# Patient Record
Sex: Male | Born: 1965 | Race: Black or African American | Hispanic: No | Smoking: Never smoker
Health system: Southern US, Community
[De-identification: ages and names within clinical notes are randomized; demographics above are authoritative.]

## PROBLEM LIST (undated history)

## (undated) DIAGNOSIS — G4733 Obstructive sleep apnea (adult) (pediatric): Secondary | ICD-10-CM

## (undated) DIAGNOSIS — Z8639 Personal history of other endocrine, nutritional and metabolic disease: Secondary | ICD-10-CM

## (undated) DIAGNOSIS — E118 Type 2 diabetes mellitus with unspecified complications: Secondary | ICD-10-CM

## (undated) DIAGNOSIS — I428 Other cardiomyopathies: Secondary | ICD-10-CM

## (undated) DIAGNOSIS — I5042 Chronic combined systolic (congestive) and diastolic (congestive) heart failure: Secondary | ICD-10-CM

## (undated) DIAGNOSIS — N183 Chronic kidney disease, stage 3 unspecified: Secondary | ICD-10-CM

## (undated) DIAGNOSIS — I639 Cerebral infarction, unspecified: Secondary | ICD-10-CM

## (undated) DIAGNOSIS — Z9119 Patient's noncompliance with other medical treatment and regimen: Secondary | ICD-10-CM

## (undated) DIAGNOSIS — Z6841 Body Mass Index (BMI) 40.0 and over, adult: Secondary | ICD-10-CM

## (undated) DIAGNOSIS — I10 Essential (primary) hypertension: Secondary | ICD-10-CM

## (undated) HISTORY — DX: Personal history of other endocrine, nutritional and metabolic disease: Z86.39

## (undated) HISTORY — PX: ANKLE SURGERY: SHX546

## (undated) HISTORY — PX: HERNIA REPAIR: SHX51

## (undated) HISTORY — DX: Chronic combined systolic (congestive) and diastolic (congestive) heart failure: I50.42

## (undated) HISTORY — DX: Type 2 diabetes mellitus with unspecified complications: E11.8

## (undated) HISTORY — PX: TRANSTHORACIC ECHOCARDIOGRAM: SHX275

## (undated) HISTORY — DX: Cerebral infarction, unspecified: I63.9

---

## 2000-03-21 ENCOUNTER — Encounter: Payer: Self-pay | Admitting: Emergency Medicine

## 2000-03-21 ENCOUNTER — Emergency Department (HOSPITAL_COMMUNITY): Admission: EM | Admit: 2000-03-21 | Discharge: 2000-03-21 | Payer: Self-pay

## 2000-03-30 ENCOUNTER — Emergency Department (HOSPITAL_COMMUNITY): Admission: EM | Admit: 2000-03-30 | Discharge: 2000-03-30 | Payer: Self-pay | Admitting: Emergency Medicine

## 2007-01-04 ENCOUNTER — Emergency Department (HOSPITAL_COMMUNITY): Admission: EM | Admit: 2007-01-04 | Discharge: 2007-01-04 | Payer: Self-pay | Admitting: Emergency Medicine

## 2009-01-19 ENCOUNTER — Ambulatory Visit: Payer: Self-pay | Admitting: Diagnostic Radiology

## 2009-01-19 ENCOUNTER — Inpatient Hospital Stay (HOSPITAL_COMMUNITY): Admission: EM | Admit: 2009-01-19 | Discharge: 2009-01-27 | Payer: Self-pay | Admitting: Internal Medicine

## 2009-01-19 ENCOUNTER — Encounter: Payer: Self-pay | Admitting: Emergency Medicine

## 2009-01-20 ENCOUNTER — Encounter (INDEPENDENT_AMBULATORY_CARE_PROVIDER_SITE_OTHER): Payer: Self-pay | Admitting: Internal Medicine

## 2009-02-16 ENCOUNTER — Ambulatory Visit: Payer: Self-pay | Admitting: Pulmonary Disease

## 2009-03-10 ENCOUNTER — Ambulatory Visit (HOSPITAL_BASED_OUTPATIENT_CLINIC_OR_DEPARTMENT_OTHER): Admission: RE | Admit: 2009-03-10 | Discharge: 2009-03-10 | Payer: Self-pay | Admitting: Pediatrics

## 2009-03-10 ENCOUNTER — Encounter: Payer: Self-pay | Admitting: Pulmonary Disease

## 2009-03-17 ENCOUNTER — Telehealth (INDEPENDENT_AMBULATORY_CARE_PROVIDER_SITE_OTHER): Payer: Self-pay | Admitting: *Deleted

## 2009-03-17 ENCOUNTER — Ambulatory Visit: Payer: Self-pay | Admitting: Pulmonary Disease

## 2009-03-20 ENCOUNTER — Telehealth: Payer: Self-pay | Admitting: Pulmonary Disease

## 2009-03-23 ENCOUNTER — Telehealth (INDEPENDENT_AMBULATORY_CARE_PROVIDER_SITE_OTHER): Payer: Self-pay | Admitting: *Deleted

## 2009-04-08 ENCOUNTER — Ambulatory Visit: Payer: Self-pay | Admitting: Pulmonary Disease

## 2009-05-13 ENCOUNTER — Ambulatory Visit: Payer: Self-pay | Admitting: Pulmonary Disease

## 2010-05-25 NOTE — Letter (Signed)
Summary: [employer]  [address]   [place], [place] [address]   Phone: [phone]  Fax: [phone]    Today's Date: May 13, 2009  Name of Patient: Devon Ortiz  The above named patient had a medical visit today at:  am / pm.  Please take this into consideration when reviewing the time away from work/school.    Special Instructions:  [  ] None  [ x] To be off the remainder of today, returning to the normal work / school schedule tomorrow.  [  ] To be off until the next scheduled appointment on ______________________.  [  ] Other ________________________________________________________________ ________________________________________________________________________   Sincerely yours,   Comer Locket. Vassie Loll MD

## 2010-05-25 NOTE — Assessment & Plan Note (Signed)
Summary: rov 1 months///kp   Visit Type:  Follow-up Copy to:  Dr. Katrinka Blazing [facility] cardiology) Primary Provider/Referring Provider:  [facility], [address], [place]  CC:  Pt here for follow up. Pt states is using CPAP every night approx 5 to 8 hours. Pt states wakes up in the AM feeling well rested.  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 He was admitted 9/27- 10/5 for Acute on chronic systolic heart failure secondary to hypertension  most likely potentially aggravated by obstructive sleep apnea with  a reduced EF of 35-40%, RVSP 43 and left   ventricular hypertrophy. Poorly controlled hypertension secondary to medical noncompliance secondary to financial hardship.  Elevated renal function  was felt to be due to secondary to hypertension/diuretics. Witnessed apneas were noted during the adm. Fiance provides sleep history - loud snoring worse on his back with apneas lasting about 30 seconds or so, gasping episodes.  Sleep latency 15- 20 mins, nocturia due to lasix ? wakes up groggy, no somnolence as long as he is moving around. Epworth Sleepiness Score 18.  April 08, 2009 9:06 AM  11/23 >> reviewed PSG with pt, severe obstructive sleep apnea with AHI 76/h corrected by full face CPAP @ 23 cm  start autoCPAP 12-23 cm , large full face mask, heated humidity Much refreshed, everyone amazed at work, no naps BP high this am, did not take meds this am. Steffanie Rainwater has custody of grandkids now  May 13, 2009 4:24 PM  Pt states is using CPAP every night approx 5 to 8 hours. Pt states wakes up in the AM feeling well rested ramp x , mask ok, pressure ok, some dryness, humidifier helps. Lost 5 lbs dowload 12/1 -12/15  improved compliance, residual AHI 2 /h, no leak     Current Medications (verified): 1)  Norvasc 5 Mg Tabs (Amlodipine Besylate) .... Take 1 Tablet By Mouth Once A Day 2)  Lasix 80 Mg Tabs (Furosemide) ....  Take 1 Tablet By Mouth Once A Day 3)  Lisinopril 20 Mg Tabs (Lisinopril) .... Take 1 Tablet By Mouth Two Times A Day 4)  Lopressor 100 Mg Tabs (Metoprolol Tartrate) .... Take 1 Tablet By Mouth Two Times A Day 5)  Klor-Con M20 20 Meq Cr-Tabs (Potassium Chloride Crys Cr) .... Take 1 Tablet By Mouth Once A Day 6)  Aspirin 325 Mg  Tabs (Aspirin) .... Take 1 Tablet By Mouth Once A Day 7)  Cpap 12-20 .... 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   Review of Systems  The patient denies anorexia, fever, weight loss, weight gain, vision loss, decreased hearing, hoarseness, chest pain, syncope, dyspnea on exertion, peripheral edema, prolonged cough, headaches, hemoptysis, abdominal pain, melena, hematochezia, severe indigestion/heartburn, hematuria, muscle weakness, suspicious skin lesions, difficulty walking, depression, unusual weight change, and abnormal bleeding.    Vital Signs:  Patient profile:   45 year old male Height:      68 inches Weight:      286 pounds O2 Sat:      96 % on Room air Temp:     98.2 degrees F oral  Pulse rate:   86 / minute BP sitting:   160 / 100  (left arm) Cuff size:   large  Vitals Entered By: Zackery Barefoot CMA (May 13, 2009 4:15 PM)  O2 Flow:  Room air CC: Pt here for follow up. Pt states is using CPAP every night approx 5 to 8 hours. Pt states wakes up in the AM feeling well rested Comments Medications reviewed with patient Verified pt's contact number Zackery Barefoot CMA  May 13, 2009 4:15 PM    Physical Exam  Additional Exam:  Gen.  Pleasant, well-nourished, in no distress, normal affect wt 290 04/08/09, 286 May 13, 2009  ENT - no lesions, no post nasal drip, class 3 airway Neck: No JVD, no thyromegaly, no carotid bruits Lungs: no use of accessory muscles, no dullness to percussion, clear without rales or rhonchi  Cardiovascular: Rhythm regular, heart sounds  normal, no murmurs or gallops, no peripheral edema Musculoskeletal: No deformities, no cyanosis or clubbing      Impression & Recommendations:  Problem # 1:  HYPERSOMNIA, ASSOCIATED WITH SLEEP APNEA (ICD-780.53) Compliance encouraged, wt loss emphasized, asked to avoid meds with sedative side effects, cautioned against driving when sleepy.  ct autoCPAP 12-20 ,this is working well for him.  Orders: Est. Patient Level III (09811) DME Referral (DME)  Problem # 2:  HYPERTENSION (ICD-401.9) Hope to see benefits on BP , meds being titrated. His updated medication list for this problem includes:    Norvasc 5 Mg Tabs (Amlodipine besylate) .Marland Kitchen... Take 1 tablet by mouth once a day    Lasix 80 Mg Tabs (Furosemide) .Marland Kitchen... Take 1 tablet by mouth once a day    Lisinopril 20 Mg Tabs (Lisinopril) .Marland Kitchen... Take 1 tablet by mouth two times a day    Lopressor 100 Mg Tabs (Metoprolol tartrate) .Marland Kitchen... Take 1 tablet by mouth two times a day  Medications Added to Medication List This Visit: 1)  Lopressor 100 Mg Tabs (Metoprolol tartrate) .... Take 1 tablet by mouth two times a day  Patient Instructions: 1)  Please schedule a follow-up appointment in 6 months. 2)  Copy sent to:Dr Claudie Revering

## 2010-07-29 LAB — BASIC METABOLIC PANEL
BUN: 15 mg/dL (ref 6–23)
CO2: 26 mEq/L (ref 19–32)
CO2: 28 mEq/L (ref 19–32)
CO2: 29 mEq/L (ref 19–32)
Calcium: 8.6 mg/dL (ref 8.4–10.5)
Calcium: 8.7 mg/dL (ref 8.4–10.5)
Calcium: 8.8 mg/dL (ref 8.4–10.5)
Calcium: 9 mg/dL (ref 8.4–10.5)
Calcium: 9.1 mg/dL (ref 8.4–10.5)
Chloride: 102 mEq/L (ref 96–112)
Chloride: 102 mEq/L (ref 96–112)
Creatinine, Ser: 1.53 mg/dL — ABNORMAL HIGH (ref 0.4–1.5)
Creatinine, Ser: 1.55 mg/dL — ABNORMAL HIGH (ref 0.4–1.5)
Creatinine, Ser: 1.55 mg/dL — ABNORMAL HIGH (ref 0.4–1.5)
Creatinine, Ser: 1.72 mg/dL — ABNORMAL HIGH (ref 0.4–1.5)
GFR calc Af Amer: 53 mL/min — ABNORMAL LOW (ref >60)
GFR calc Af Amer: 58 mL/min — ABNORMAL LOW (ref >60)
GFR calc Af Amer: >60 mL/min (ref >60)
GFR calc Af Amer: >60 mL/min (ref >60)
GFR calc Af Amer: >60 mL/min (ref >60)
GFR calc non Af Amer: 44 mL/min — ABNORMAL LOW (ref >60)
GFR calc non Af Amer: 48 mL/min — ABNORMAL LOW (ref >60)
GFR calc non Af Amer: 50 mL/min — ABNORMAL LOW (ref >60)
Glucose, Bld: 101 mg/dL — ABNORMAL HIGH (ref 70–99)
Glucose, Bld: 94 mg/dL (ref 70–99)
Potassium: 3.4 mEq/L — ABNORMAL LOW (ref 3.5–5.1)
Potassium: 3.7 mEq/L (ref 3.5–5.1)
Sodium: 134 mEq/L — ABNORMAL LOW (ref 135–145)
Sodium: 137 mEq/L (ref 135–145)
Sodium: 138 mEq/L (ref 135–145)
Sodium: 138 mEq/L (ref 135–145)
Sodium: 138 mEq/L (ref 135–145)

## 2010-07-29 LAB — MAGNESIUM: Magnesium: 2.3 mg/dL (ref 1.5–2.5)

## 2010-07-29 LAB — BRAIN NATRIURETIC PEPTIDE
Pro B Natriuretic peptide (BNP): 232 pg/mL — ABNORMAL HIGH (ref 0.0–100.0)
Pro B Natriuretic peptide (BNP): 275 pg/mL — ABNORMAL HIGH (ref 0.0–100.0)
Pro B Natriuretic peptide (BNP): 278 pg/mL — ABNORMAL HIGH (ref 0.0–100.0)
Pro B Natriuretic peptide (BNP): 360 pg/mL — ABNORMAL HIGH (ref 0.0–100.0)

## 2010-07-30 LAB — CBC
MCHC: 33.2 g/dL (ref 30.0–36.0)
MCV: 85.6 fL (ref 78.0–100.0)
Platelets: 182 10*3/uL (ref 150–400)
Platelets: 187 10*3/uL (ref 150–400)
RBC: 5.02 MIL/uL (ref 4.22–5.81)
RBC: 5.09 MIL/uL (ref 4.22–5.81)
RDW: 15 % (ref 11.5–15.5)
WBC: 5.5 10*3/uL (ref 4.0–10.5)
WBC: 6.8 10*3/uL (ref 4.0–10.5)

## 2010-07-30 LAB — BASIC METABOLIC PANEL
BUN: 11 mg/dL (ref 6–23)
BUN: 15 mg/dL (ref 6–23)
Calcium: 8.3 mg/dL — ABNORMAL LOW (ref 8.4–10.5)
Calcium: 8.4 mg/dL (ref 8.4–10.5)
Creatinine, Ser: 1.51 mg/dL — ABNORMAL HIGH (ref 0.4–1.5)
Creatinine, Ser: 1.55 mg/dL — ABNORMAL HIGH (ref 0.4–1.5)
GFR calc non Af Amer: 50 mL/min — ABNORMAL LOW (ref >60)
GFR calc non Af Amer: 51 mL/min — ABNORMAL LOW (ref >60)
Glucose, Bld: 127 mg/dL — ABNORMAL HIGH (ref 70–99)
Glucose, Bld: 99 mg/dL (ref 70–99)
Sodium: 134 mEq/L — ABNORMAL LOW (ref 135–145)
Sodium: 136 mEq/L (ref 135–145)

## 2010-07-30 LAB — COMPREHENSIVE METABOLIC PANEL
ALT: 25 U/L (ref 0–53)
ALT: 29 U/L (ref 0–53)
AST: 22 U/L (ref 0–37)
AST: 22 U/L (ref 0–37)
Albumin: 3.3 g/dL — ABNORMAL LOW (ref 3.5–5.2)
CO2: 30 mEq/L (ref 19–32)
Chloride: 104 mEq/L (ref 96–112)
Chloride: 105 mEq/L (ref 96–112)
Creatinine, Ser: 1.3 mg/dL (ref 0.4–1.5)
GFR calc Af Amer: >60 mL/min (ref >60)
GFR calc Af Amer: >60 mL/min (ref >60)
GFR calc non Af Amer: 52 mL/min — ABNORMAL LOW (ref >60)
Potassium: 3.6 mEq/L (ref 3.5–5.1)
Sodium: 141 mEq/L (ref 135–145)
Sodium: 141 mEq/L (ref 135–145)
Total Bilirubin: 0.9 mg/dL (ref 0.3–1.2)
Total Bilirubin: 2.2 mg/dL — ABNORMAL HIGH (ref 0.3–1.2)

## 2010-07-30 LAB — DIFFERENTIAL
Basophils Absolute: 0 10*3/uL (ref 0.0–0.1)
Basophils Absolute: 0.2 10*3/uL — ABNORMAL HIGH (ref 0.0–0.1)
Eosinophils Absolute: 0.1 10*3/uL (ref 0.0–0.7)
Eosinophils Relative: 1 % (ref 0–5)
Lymphocytes Relative: 24 % (ref 12–46)
Lymphs Abs: 1.4 10*3/uL (ref 0.7–4.0)
Monocytes Absolute: 0.6 10*3/uL (ref 0.1–1.0)
Monocytes Absolute: 0.6 10*3/uL (ref 0.1–1.0)

## 2010-07-30 LAB — CARDIAC PANEL(CRET KIN+CKTOT+MB+TROPI)
CK, MB: 1.5 ng/mL (ref 0.3–4.0)
CK, MB: 1.6 ng/mL (ref 0.3–4.0)
Total CK: 301 U/L — ABNORMAL HIGH (ref 7–232)
Troponin I: 0.01 ng/mL (ref 0.00–0.06)

## 2010-07-30 LAB — T4, FREE: Free T4: 1.16 ng/dL (ref 0.80–1.80)

## 2010-07-30 LAB — URINALYSIS, ROUTINE W REFLEX MICROSCOPIC
Bilirubin Urine: NEGATIVE
Hgb urine dipstick: NEGATIVE
Specific Gravity, Urine: 1.021 (ref 1.005–1.030)
Urobilinogen, UA: 1 mg/dL (ref 0.0–1.0)
pH: 6 (ref 5.0–8.0)

## 2010-07-30 LAB — LIPID PANEL
Cholesterol: 154 mg/dL (ref 0–200)
LDL Cholesterol: 107 mg/dL — ABNORMAL HIGH (ref 0–99)
Total CHOL/HDL Ratio: 4.4 RATIO

## 2010-07-30 LAB — POCT CARDIAC MARKERS
CKMB, poc: <1 ng/mL — ABNORMAL LOW (ref 1.0–8.0)
Myoglobin, poc: 97.7 ng/mL (ref 12–200)

## 2010-12-25 DIAGNOSIS — I428 Other cardiomyopathies: Secondary | ICD-10-CM | POA: Diagnosis present

## 2010-12-25 HISTORY — DX: Other cardiomyopathies: I42.8

## 2011-01-17 ENCOUNTER — Emergency Department (HOSPITAL_COMMUNITY): Payer: Managed Care, Other (non HMO)

## 2011-01-17 ENCOUNTER — Inpatient Hospital Stay (HOSPITAL_COMMUNITY)
Admission: EM | Admit: 2011-01-17 | Discharge: 2011-01-22 | DRG: 292 | Disposition: A | Discharge status: Home / Self Care | Payer: Managed Care, Other (non HMO) | Attending: Internal Medicine | Admitting: Internal Medicine

## 2011-01-17 DIAGNOSIS — R7309 Other abnormal glucose: Secondary | ICD-10-CM | POA: Diagnosis present

## 2011-01-17 DIAGNOSIS — N179 Acute kidney failure, unspecified: Secondary | ICD-10-CM | POA: Diagnosis not present

## 2011-01-17 DIAGNOSIS — Z7982 Long term (current) use of aspirin: Secondary | ICD-10-CM

## 2011-01-17 DIAGNOSIS — I129 Hypertensive chronic kidney disease with stage 1 through stage 4 chronic kidney disease, or unspecified chronic kidney disease: Secondary | ICD-10-CM | POA: Diagnosis present

## 2011-01-17 DIAGNOSIS — N182 Chronic kidney disease, stage 2 (mild): Secondary | ICD-10-CM | POA: Diagnosis present

## 2011-01-17 DIAGNOSIS — I428 Other cardiomyopathies: Secondary | ICD-10-CM | POA: Diagnosis present

## 2011-01-17 DIAGNOSIS — I5043 Acute on chronic combined systolic (congestive) and diastolic (congestive) heart failure: Principal | ICD-10-CM | POA: Diagnosis present

## 2011-01-17 DIAGNOSIS — I509 Heart failure, unspecified: Secondary | ICD-10-CM | POA: Diagnosis present

## 2011-01-17 DIAGNOSIS — E669 Obesity, unspecified: Secondary | ICD-10-CM | POA: Diagnosis present

## 2011-01-17 DIAGNOSIS — G4733 Obstructive sleep apnea (adult) (pediatric): Secondary | ICD-10-CM | POA: Diagnosis present

## 2011-01-17 LAB — COMPREHENSIVE METABOLIC PANEL
Alkaline Phosphatase: 76 U/L (ref 39–117)
BUN: 19 mg/dL (ref 6–23)
Chloride: 103 mEq/L (ref 96–112)
GFR calc Af Amer: >60 mL/min (ref >60)
Glucose, Bld: 119 mg/dL — ABNORMAL HIGH (ref 70–99)
Potassium: 3.6 mEq/L (ref 3.5–5.1)
Total Bilirubin: 1 mg/dL (ref 0.3–1.2)

## 2011-01-17 LAB — CK TOTAL AND CKMB (NOT AT ARMC): Total CK: 445 U/L — ABNORMAL HIGH (ref 7–232)

## 2011-01-17 LAB — DIFFERENTIAL
Eosinophils Absolute: 0.1 10*3/uL (ref 0.0–0.7)
Lymphs Abs: 1.4 10*3/uL (ref 0.7–4.0)
Monocytes Absolute: 0.3 10*3/uL (ref 0.1–1.0)
Monocytes Relative: 6 % (ref 3–12)
Neutrophils Relative %: 68 % (ref 43–77)

## 2011-01-17 LAB — CBC
Hemoglobin: 13.8 g/dL (ref 13.0–17.0)
MCH: 26 pg (ref 26.0–34.0)
MCHC: 31.3 g/dL (ref 30.0–36.0)
MCV: 83.2 fL (ref 78.0–100.0)
Platelets: 205 10*3/uL (ref 150–400)
RBC: 5.3 MIL/uL (ref 4.22–5.81)

## 2011-01-18 LAB — LIPID PANEL
LDL Cholesterol: 137 mg/dL — ABNORMAL HIGH (ref 0–99)
Total CHOL/HDL Ratio: 4.4 RATIO
VLDL: 14 mg/dL (ref 0–40)

## 2011-01-18 LAB — BASIC METABOLIC PANEL
Calcium: 9.6 mg/dL (ref 8.4–10.5)
GFR calc non Af Amer: 51 mL/min — ABNORMAL LOW (ref >60)
Glucose, Bld: 106 mg/dL — ABNORMAL HIGH (ref 70–99)
Sodium: 138 mEq/L (ref 135–145)

## 2011-01-18 LAB — DIFFERENTIAL
Basophils Absolute: 0 10*3/uL (ref 0.0–0.1)
Eosinophils Relative: 2 % (ref 0–5)
Lymphocytes Relative: 35 % (ref 12–46)
Neutro Abs: 2.5 10*3/uL (ref 1.7–7.7)
Neutrophils Relative %: 51 % (ref 43–77)

## 2011-01-18 LAB — CK TOTAL AND CKMB (NOT AT ARMC)
CK, MB: 2.1 ng/mL (ref 0.3–4.0)
CK, MB: 2.5 ng/mL (ref 0.3–4.0)
Relative Index: 0.5 (ref 0.0–2.5)
Relative Index: 0.6 (ref 0.0–2.5)
Total CK: 376 U/L — ABNORMAL HIGH (ref 7–232)

## 2011-01-18 LAB — CBC
HCT: 42.2 % (ref 39.0–52.0)
Hemoglobin: 13.7 g/dL (ref 13.0–17.0)
RDW: 14.6 % (ref 11.5–15.5)
WBC: 4.9 10*3/uL (ref 4.0–10.5)

## 2011-01-18 LAB — TROPONIN I: Troponin I: <0.3 ng/mL (ref <0.30)

## 2011-01-18 LAB — HEMOGLOBIN A1C: Mean Plasma Glucose: 134 mg/dL — ABNORMAL HIGH (ref <117)

## 2011-01-19 ENCOUNTER — Ambulatory Visit (HOSPITAL_COMMUNITY): Payer: Managed Care, Other (non HMO)

## 2011-01-19 ENCOUNTER — Inpatient Hospital Stay (HOSPITAL_COMMUNITY): Payer: Managed Care, Other (non HMO)

## 2011-01-19 LAB — BASIC METABOLIC PANEL
Calcium: 9.4 mg/dL (ref 8.4–10.5)
Creatinine, Ser: 1.66 mg/dL — ABNORMAL HIGH (ref 0.50–1.35)
GFR calc Af Amer: 55 mL/min — ABNORMAL LOW (ref >60)
GFR calc non Af Amer: 45 mL/min — ABNORMAL LOW (ref >60)

## 2011-01-19 LAB — MAGNESIUM: Magnesium: 2.3 mg/dL (ref 1.5–2.5)

## 2011-01-19 LAB — CBC
MCH: 26.3 pg (ref 26.0–34.0)
MCHC: 32.1 g/dL (ref 30.0–36.0)
MCV: 81.9 fL (ref 78.0–100.0)
Platelets: 194 10*3/uL (ref 150–400)
RDW: 14.6 % (ref 11.5–15.5)
WBC: 6.1 10*3/uL (ref 4.0–10.5)

## 2011-01-19 MED ORDER — TECHNETIUM TC 99M TETROFOSMIN IV KIT: 30.0000 | PACK | Freq: Once | INTRAVENOUS | Status: AC | PRN

## 2011-01-19 MED ORDER — TECHNETIUM TC 99M TETROFOSMIN IV KIT
10.0000 | PACK | Freq: Once | INTRAVENOUS | Status: AC | PRN
  Administered 2011-01-19: 10 via INTRAVENOUS

## 2011-01-20 LAB — MRSA PCR SCREENING: MRSA by PCR: NEGATIVE

## 2011-01-21 ENCOUNTER — Ambulatory Visit (HOSPITAL_COMMUNITY)
Admission: AD | Admit: 2011-01-21 | Discharge: 2011-01-21 | Disposition: A | Discharge status: Home / Self Care | Payer: Managed Care, Other (non HMO) | Source: Ambulatory Visit | Attending: Cardiology | Admitting: Cardiology

## 2011-01-21 DIAGNOSIS — Z9889 Other specified postprocedural states: Secondary | ICD-10-CM

## 2011-01-21 DIAGNOSIS — I5042 Chronic combined systolic (congestive) and diastolic (congestive) heart failure: Secondary | ICD-10-CM

## 2011-01-21 HISTORY — DX: Chronic combined systolic (congestive) and diastolic (congestive) heart failure: I50.42

## 2011-01-21 HISTORY — PX: CARDIAC CATHETERIZATION: SHX172

## 2011-01-21 LAB — BASIC METABOLIC PANEL
BUN: 33 mg/dL — ABNORMAL HIGH (ref 6–23)
Calcium: 9.2 mg/dL (ref 8.4–10.5)
GFR calc Af Amer: 52 mL/min — ABNORMAL LOW (ref >60)
GFR calc non Af Amer: 43 mL/min — ABNORMAL LOW (ref >60)
Glucose, Bld: 110 mg/dL — ABNORMAL HIGH (ref 70–99)
Potassium: 3.4 mEq/L — ABNORMAL LOW (ref 3.5–5.1)
Sodium: 135 mEq/L (ref 135–145)

## 2011-01-21 LAB — PROTIME-INR: Prothrombin Time: 14.3 seconds (ref 11.6–15.2)

## 2011-01-22 LAB — CBC
Hemoglobin: 13 g/dL (ref 13.0–17.0)
MCH: 26.5 pg (ref 26.0–34.0)
MCHC: 31.8 g/dL (ref 30.0–36.0)
Platelets: 192 10*3/uL (ref 150–400)
RBC: 4.9 MIL/uL (ref 4.22–5.81)

## 2011-01-22 LAB — BASIC METABOLIC PANEL
Calcium: 8.9 mg/dL (ref 8.4–10.5)
GFR calc non Af Amer: 57 mL/min — ABNORMAL LOW (ref >60)
Potassium: 4 mEq/L (ref 3.5–5.1)
Sodium: 139 mEq/L (ref 135–145)

## 2011-01-24 NOTE — Cardiovascular Report (Signed)
NAME:  Devon Ortiz, Devon Ortiz NO.:  [id number]  MEDICAL RECORD NO.:  [id number]  LOCATION:  CATH                         FACILITY:  [facility]  PHYSICIAN:  Landry Corporal, MD DATE OF BIRTH:  [birth year]  DATE OF PROCEDURE:  01/21/2011 DATE OF DISCHARGE:  01/21/2011                           CARDIAC CATHETERIZATION   PRIMARY CARE PHYSICIAN:  Fleet Contras, MD  PRIMARY CARDIOLOGIST:  Landry Corporal, MD  PROCEDURE PERFORMED: 1. Left heart catheterization. 2. A 5-French right radial artery access was done without left     ventriculography. 3. Native coronary angiography.  INDICATIONS:  Cardiomyopathy with abnormal findings on echocardiogram and nuclear.  BRIEF HISTORY:  Devon Ortiz is a very pleasant 45 year old gentleman with history of known CHF diagnosed back in 2010 at which time an echocardiogram showed an ejection fraction of roughly 40%, he had done relatively well, but he kind of lost Cardiology followup.  In the interim at that time he was seen by Dr. Verdis Prime.  He now presented to [facility], was admitted to the Hospitalist Service with an accelerated hypertension and chest tightness and shortness of breath. He was found to be in acute on chronic systolic/diastolic heart failure and has been treated aggressively with diuresis.  He had a [employer], which noted possible infarct versus diaphragmatic attenuation in the inferior border, but echocardiogram suggested possible also an anterior wall motion abnormality, but there was a nuclear estimated ejection fraction of 30%, the echo suggested EF of 45% to 50%.  Based on the confusing findings in between these 2 studies, the decision was made to proceed with diagnostic cardiac catheterization.  The risk, benefits, alternatives, indications of procedure were explained to the patient in detail and informed consent was obtained with signed form placed on the chart.  The patient agreed to  proceed.  DESCRIPTION OF PROCEDURE:  The patient was brought to Second [facility] in a fasting state.  He was prepped and draped in usual sterile fashion for radial artery access after an adequate Allen's test, which showed pulmonary artery collateralization on the right.  Time-out period was performed and the patient was sedated with intravenous Versed and fentanyl.  The right wrist was anesthetized using 1% subcutaneous lidocaine and the right radial artery was accessed using the Seldinger technique and placement of 5-French glide sheath. Sheath was aspirated and flushed and infiltrated with total of 10 mL of standard radial cocktail.  He was then administered intravenous heparin weight based 5000 units.  First a 5-French Tig 4.0 catheter was advanced over a versa core wire into the ascending artery.  It was then used to engage the right and left coronary arteries and multiple angiographic views of the right and left coronaries were obtained.  Catheter was then used to direct into the left ventricle for measuring the left ventricular hemodynamics.  As he did have a somewhat increasing creatinine prior to the procedure, the decision was made to forego the left ventriculography in order to conserve contrast as his large-caliber coronaries required significant amount of contrast injection.  After measuring the hemodynamics, catheter was pulled back across the aortic valve measuring pullback gradient.  The  catheters were then removed within the body over the safety J-wire without any complications.  TR band was then removed in the cath lab with adequate nonocclusive hemostasis confirmed by reverse Allen's.  The patient was then transferred to the holding area for ongoing care in a stable condition. The patient was stable throughout the procedure and no complications.  ESTIMATED BLOOD LOSS:  Less than 10 mL.  MEDICATIONS: 1. Sedation:  2 mg Versed, 50  mcg fentanyl. 2. Radial cocktail:  5 mg verapamil, 400 mcg nitroglycerin, 2 mL of 1%     lidocaine. 3. Heparin intravenous 5000 units.  HEMODYNAMICS: 1. Aortic pressure 110/86 mmHg with mean of 96 mmHg. 2. Left ventricular pressure 112/11 mmHg with an EDP of 24 mmHg.  ANGIOGRAPHIC FINDINGS:  In general, there is very large caliber vessels and appears to be mostly left dominant release codominant system. 1. The right coronary artery is still large-caliber vessel, it gives     rise to a very small vessel, which appears to be the proximal     portion of the PDA.  This vessel has no significant disease and is     too decent sized RV marginal branches.  The conus branch actually     appears to have a somewhat separate ostium just a little bit up     superior to the takeoff of the RCA. 2. Left main really is a very, very short, almost like left main which     trifurcates into a LAD, ramus, and circumflex.  There is no disease     noted. 3. The LAD is a large-caliber vessel that gives rise to large sleeping     diagonal branch as well as septal perforators and there is no     significant disease noted in the draping fashion for this vessel. 4. Ramus intermedius.  Again this is a very large-caliber vessel, it     is sort of an obtuse marginal distribution and gives off small     branches and to the lateral wall.  There is no disease noted in the     ramus. 5. The circumflex does not have many branches in the atrioventricular     groove, but has several posterolateral branches at what looks to be     at the distal end of the PDA.  There is no significant disease in     this vessel either.  Again, a large draping vessel.  IMPRESSION: 1. 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 2. Normal-to-mildly elevated EDP which demonstrate adequate diuresis     with adequate reduction with an  EDP of 24 after hydration pre-cath.  PLAN: 1. Post radial care. 2. Return to [facility] for continued followup.  We will likely     monitor the patient overnight for hydration to see how his     creatinine does.  He may need an injection of Lasix at completion     of hydration to also avoid back tracking from his pre-cath     diuresis. 3. If he is stable without any complications, anticipate discharge in     the morning. 4. Explained the findings and recommendations to Dr. Rito Ehrlich from the     [facility] who will continue to manage the patient     at [facility].          ______________________________  Landry Corporal, MD     DWH/MEDQ  D:  01/23/2011  T:  01/23/2011  Job:  [id number]  cc:   [facility] Second Floor [facility] Cardiac Catheter Lab  Electronically Signed by Bryan Lemma MD on 01/24/2011 08:47:12 PM
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
## 2011-01-26 ENCOUNTER — Ambulatory Visit (INDEPENDENT_AMBULATORY_CARE_PROVIDER_SITE_OTHER): Payer: Managed Care, Other (non HMO) | Admitting: Pulmonary Disease

## 2011-01-26 ENCOUNTER — Encounter: Payer: Self-pay | Admitting: *Deleted

## 2011-01-26 VITALS — BP 138/98 | HR 75 | Temp 98.6°F | Ht 68.0 in | Wt 285.2 lb

## 2011-01-26 DIAGNOSIS — G4730 Sleep apnea, unspecified: Secondary | ICD-10-CM

## 2011-01-26 DIAGNOSIS — Z23 Encounter for immunization: Secondary | ICD-10-CM

## 2011-01-26 NOTE — Patient Instructions (Signed)
Flu shot Expectation is that you will use CPAP for at least 6 ours every night to get benefits for your heart & BP Letter for work

## 2011-01-26 NOTE — Progress Notes (Signed)
  Subjective:    Patient ID: Devon Ortiz, male    DOB: 1965/07/22, 45 y.o.   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  He was admitted 9/27- 01/27/09 for Acute on chronic systolic heart failure secondary to hypertension most likely potentially aggravated by obstructive sleep apnea with a reduced EF of 35-40%, RVSP 43 and left ventricular hypertrophy. 11/10 PSG >> severe obstructive sleep apnea with AHI 76/h corrected by full face CPAP @ 23 cm  started autoCPAP 12-23 cm , large full face mask, heated humidity  dowload 12/1 -04/08/09  improved compliance, residual AHI 2 /h, no leak   01/26/2011 Acc htn requiring hosp admit 9/12, cath neg c/w non ichemic CMP Cr 1.4 Download >> 6h compliance, on auto 12-20 with avg pressure 17 cm, occ leak Mask ok, pressure ok, cpap helps, took it to the hospital Sleep time is not fixed due to work schedule & his dept has not let him adjust.   Review of Systems Patient denies significant dyspnea,cough, hemoptysis,  chest pain, palpitations, pedal edema, orthopnea, paroxysmal nocturnal dyspnea, lightheadedness, nausea, vomiting, abdominal or  leg pains      Objective:   Physical Exam  Gen. Pleasant, obese, in no distress ENT - no lesions, no post nasal drip Neck: No JVD, no thyromegaly, no carotid bruits Lungs: no use of accessory muscles, no dullness to percussion, decreased without rales or rhonchi  Cardiovascular: Rhythm regular, heart sounds  normal, no murmurs or gallops, no peripheral edema Musculoskeletal: No deformities, no cyanosis or clubbing , no tremors       Assessment & Plan:

## 2011-01-26 NOTE — Assessment & Plan Note (Signed)
Ct auto CPAP 12-20 cm, can change to fixed pr 17 cm based on download but he has adjusted well Weight loss encouraged, compliance with goal of at least 4-6 hrs every night is the expectation. Advised against medications with sedative side effects Cautioned against driving when sleepy - understanding that sleepiness will vary on a day to day basis  Will give him letter for work - it will help him to have a fixed sleep  schedule, so that he can control his BP better

## 2011-01-30 NOTE — Discharge Summary (Signed)
Devon Ortiz, Devon Ortiz NO.:  [id number]  MEDICAL RECORD NO.:  [id number]  LOCATION:  1443                         FACILITY:  [facility]  PHYSICIAN:  Hollice Espy, M.D.DATE OF BIRTH:  1966-04-18  DATE OF ADMISSION:  01/17/2011 DATE OF DISCHARGE:  01/22/2011                              DISCHARGE SUMMARY   ATTENDING PHYSICIAN:  Hollice Espy, M.D.  PRIMARY CARE PHYSICIAN:  Fleet Contras, M.D.  CONSULTANTS ON THIS CASE:  Dr. Herbie Baltimore of [facility].  DISCHARGE DIAGNOSES: 1. Acute on chronic systolic and diastolic heart failure. 2. Secondary to nonischemic cardiomyopathy with an EF of 31%. 3. Hypertension. 4. Morbid obesity. 5. Obstructive sleep apnea. 6. Diabetes mellitus. 7. Acute renal failure on top of stage 2 chronic kidney disease, now     back at baseline.  Creatinine on day of discharge is 1.37.  DISPOSITION:  Improved.  ACTIVITY:  Slowly increase with no lifting x3 days then return back to light activity.  Slow increase back to work.  DISCHARGE DIET:  Heart-healthy carb modified diet.  FOLLOWUP APPOINTMENTS:  The patient will follow up with PCP, Dr. Fleet Contras in 2-4 weeks.  He will follow up with [facility] in the next 1 week.  They are planning to call him to set up an appointment.  Discharge medications for this patient are as follows: 1. Aspirin 81. 2. Lasix.  He was previously on 80 mg a day, which he decreased down     to 40. 3. Imdur 30. 4. Metoprolol 100 p.o. b.i.d.  Previously he was on daily, not it is     being increased to b.i.d. 5. K-Dur 20 mEq p.o. daily. 6. Crestor 20 p.o. q.h.s. 7. Lisinopril 20. 8. Norvasc 5.  HOSPITAL COURSE:  The patient is a 45 year old African American male with past history of CHF with systolic dysfunctions, obstructive sleep apnea, chronic kidney disease, and obesity.  He presented with increased shortness of breath in regards to his congestive  heart failure and cardiomyopathy.  He looked to be more short of breath, then this finding was congestive heart failure on physical exam, he was admitted. Cardiology was consulted.  The patient was started on diuretics.  2D echo was ordered.  He underwent a Lexiscan Myoview, which noted questionable infarct versus diaphragmatic attenuation.  With these findings, [facility] planned on taking the patient then for cardiac catheterization.  In addition, it was found to be discrepancy between his early cath and IV stress test.  It was likely felt that his stress test should noted a fixed defect.  He was concerning for multivessel CAD versus left main proximal LAD disease.  The patient went for a cardiac catheterization to the right radial on September 28.  No angiographic evidence of CAD was noted.  Cardiomyopathy was found to have large dilated coronaries with and this was all felt to be nonischemic cardiomyopathy.  The patient at meantime was continued to be aggressively diuresed.  By day of discharge, he was much improved, feeling comfortable, breathing normally on room air, able to ambulate. Neuro recommend being needing closed outpatient followup.  Continue on diuretics, which had been adjusted in  regards to his renal failure.  He was found to have stage 2 chronic kidney disease.  On admission, his creatinine was 1.41, following diuresis it has peaked to as was high as 1.75, but had adjusted and by the time of discharge it was down to 1.37. In regards to his diabetes mellitus, his A1c was noted to be 6.3.  In regards to his hypertension, his medications were adjusted.  Nitrate was added.  Beta-blocker was increased twice a day.  He was continued on his ACE and Norvasc and diuretic.  By the day of discharge, blood pressure is 125/78 with a heart rate of 74.  The patient has been counseled and educated on his discharge planning and apparently, he will follow up with Cardiology in the next  1 week.     Hollice Espy, M.D.     SKK/MEDQ  D:  01/22/2011  T:  01/22/2011  Job:  [id number]  cc:   Fleet Contras, M.D. Fax: [phone]  Landry Corporal, MD Fax: [phone]  Electronically Signed by Virginia Rochester M.D. on 01/30/2011 05:33:09 PM

## 2011-02-01 ENCOUNTER — Encounter: Payer: Self-pay | Admitting: Pulmonary Disease
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
## 2011-02-27 ENCOUNTER — Other Ambulatory Visit: Payer: Self-pay | Admitting: Internal Medicine

## 2011-09-21 IMAGING — CR DG CHEST 2V
2 series · 2 of 2 positions shown · non-contrast
Comparison: None

CLINICAL DATA: Hypertension and shortness of breath.

CHEST - 2 VIEW

[w chest pa]
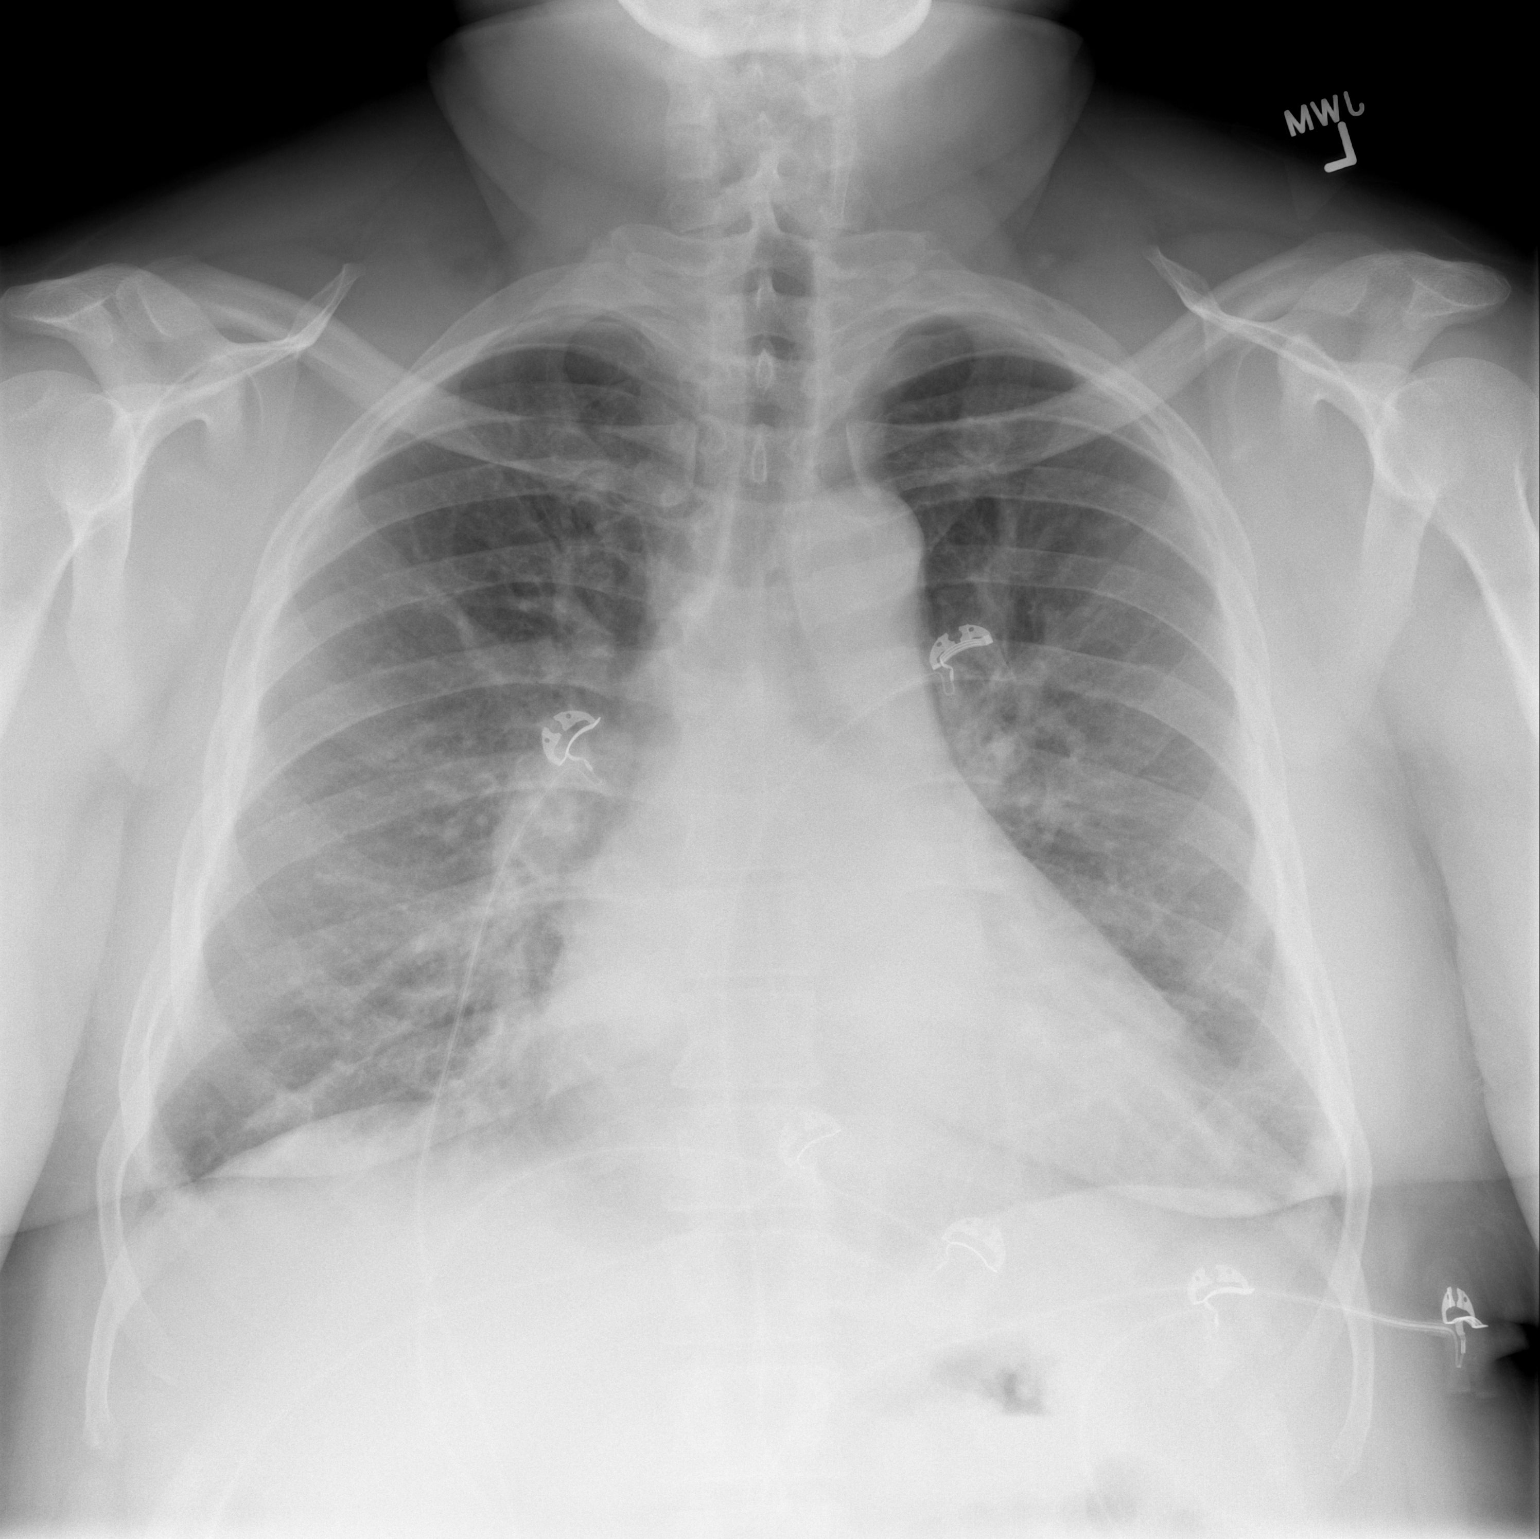

[w chest lat]
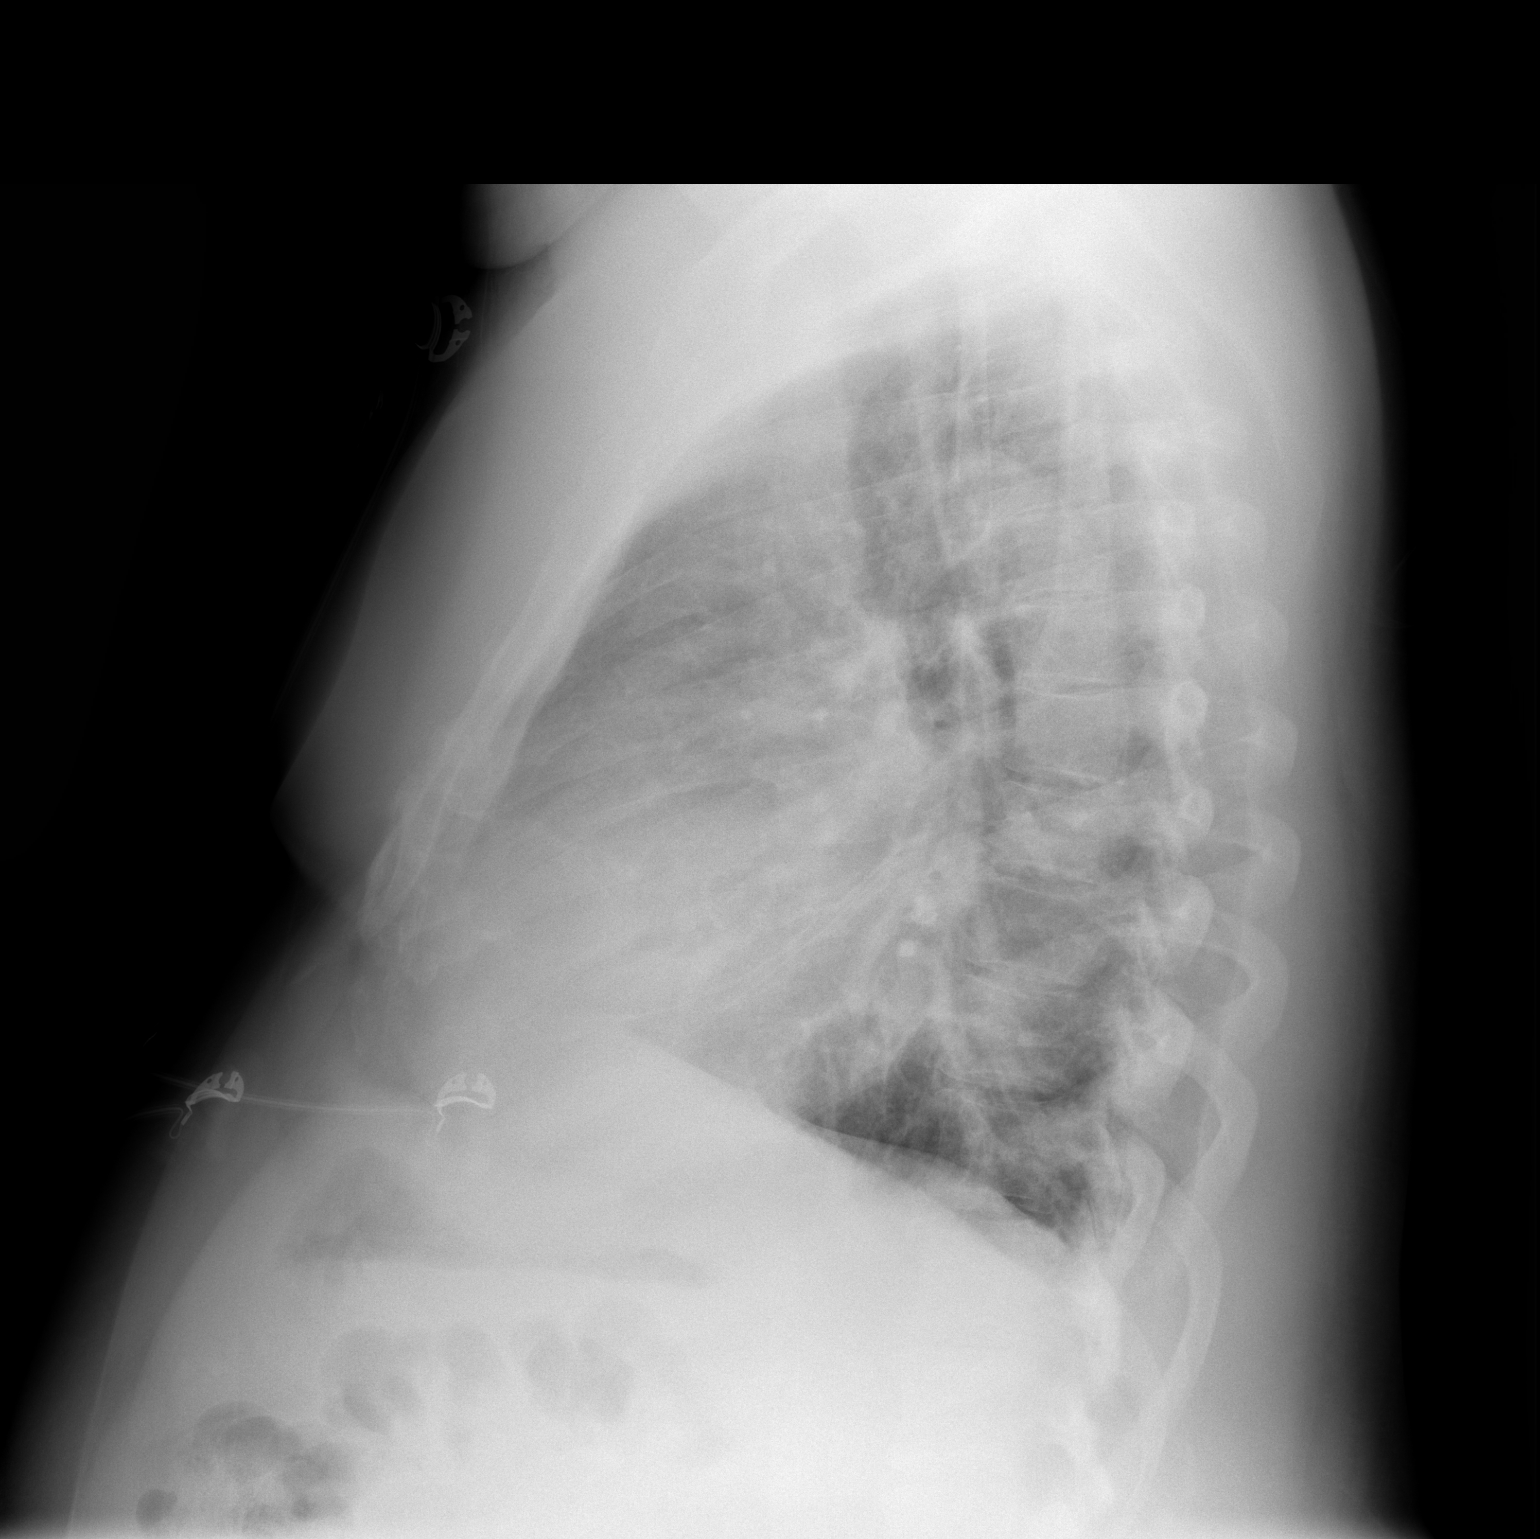

[2 of 2 positions shown; findings below may reference images not displayed]

FINDINGS: The heart is mildly enlarged.  There is vascular
congestion and mild pulmonary edema suggesting CHF.  There are
small pleural effusions.  The bony thorax is intact.
IMPRESSION: Findings suggest mild CHF.

## 2012-01-26 ENCOUNTER — Telehealth: Payer: Self-pay | Admitting: Pulmonary Disease

## 2012-01-26 NOTE — Telephone Encounter (Signed)
ATC pt x3 to make next ov per recall.  No answer and wasn't able to leave a message.  Mailed recall letter 01/26/12. Leanora Ivanoff

## 2012-03-09 ENCOUNTER — Encounter (HOSPITAL_COMMUNITY): Payer: Self-pay | Admitting: Emergency Medicine

## 2012-03-09 ENCOUNTER — Inpatient Hospital Stay (HOSPITAL_COMMUNITY)
Admission: EM | Admit: 2012-03-09 | Discharge: 2012-03-13 | DRG: 293 | Disposition: A | Discharge status: Home / Self Care | Payer: Self-pay | Attending: Internal Medicine | Admitting: Internal Medicine

## 2012-03-09 ENCOUNTER — Emergency Department (HOSPITAL_COMMUNITY): Payer: Self-pay

## 2012-03-09 DIAGNOSIS — I160 Hypertensive urgency: Secondary | ICD-10-CM

## 2012-03-09 DIAGNOSIS — Z79899 Other long term (current) drug therapy: Secondary | ICD-10-CM

## 2012-03-09 DIAGNOSIS — Z6839 Body mass index (BMI) 39.0-39.9, adult: Secondary | ICD-10-CM

## 2012-03-09 DIAGNOSIS — G4733 Obstructive sleep apnea (adult) (pediatric): Secondary | ICD-10-CM | POA: Diagnosis present

## 2012-03-09 DIAGNOSIS — N183 Chronic kidney disease, stage 3 unspecified: Secondary | ICD-10-CM | POA: Diagnosis present

## 2012-03-09 DIAGNOSIS — I129 Hypertensive chronic kidney disease with stage 1 through stage 4 chronic kidney disease, or unspecified chronic kidney disease: Secondary | ICD-10-CM | POA: Diagnosis present

## 2012-03-09 DIAGNOSIS — E669 Obesity, unspecified: Secondary | ICD-10-CM | POA: Diagnosis present

## 2012-03-09 DIAGNOSIS — Z7982 Long term (current) use of aspirin: Secondary | ICD-10-CM

## 2012-03-09 DIAGNOSIS — I5043 Acute on chronic combined systolic (congestive) and diastolic (congestive) heart failure: Principal | ICD-10-CM | POA: Diagnosis present

## 2012-03-09 DIAGNOSIS — I10 Essential (primary) hypertension: Secondary | ICD-10-CM

## 2012-03-09 DIAGNOSIS — Z23 Encounter for immunization: Secondary | ICD-10-CM

## 2012-03-09 DIAGNOSIS — G4730 Sleep apnea, unspecified: Secondary | ICD-10-CM

## 2012-03-09 DIAGNOSIS — I509 Heart failure, unspecified: Secondary | ICD-10-CM | POA: Diagnosis present

## 2012-03-09 LAB — CBC WITH DIFFERENTIAL/PLATELET
Eosinophils Absolute: 0.1 10*3/uL (ref 0.0–0.7)
Hemoglobin: 12.1 g/dL — ABNORMAL LOW (ref 13.0–17.0)
Lymphocytes Relative: 22 % (ref 12–46)
Lymphs Abs: 1.3 10*3/uL (ref 0.7–4.0)
Monocytes Relative: 8 % (ref 3–12)
Neutro Abs: 4 10*3/uL (ref 1.7–7.7)
Neutrophils Relative %: 69 % (ref 43–77)
Platelets: 279 10*3/uL (ref 150–400)
RBC: 4.64 MIL/uL (ref 4.22–5.81)
WBC: 5.8 10*3/uL (ref 4.0–10.5)

## 2012-03-09 LAB — TROPONIN I: Troponin I: <0.3 ng/mL (ref <0.30)

## 2012-03-09 LAB — PROTIME-INR: INR: 1.09 (ref 0.00–1.49)

## 2012-03-09 LAB — POCT I-STAT TROPONIN I: Troponin i, poc: 0.03 ng/mL (ref 0.00–0.08)

## 2012-03-09 LAB — BASIC METABOLIC PANEL
CO2: 26 mEq/L (ref 19–32)
Chloride: 105 mEq/L (ref 96–112)
GFR calc non Af Amer: 44 mL/min — ABNORMAL LOW (ref >90)
Glucose, Bld: 100 mg/dL — ABNORMAL HIGH (ref 70–99)
Potassium: 3.7 mEq/L (ref 3.5–5.1)
Sodium: 140 mEq/L (ref 135–145)

## 2012-03-09 MED ORDER — ATORVASTATIN CALCIUM 40 MG PO TABS
40.0000 mg | ORAL_TABLET | Freq: Every day | ORAL | Status: DC
  Administered 2012-03-10: 40 mg via ORAL
  Filled 2012-03-09: qty 1

## 2012-03-09 MED ORDER — ENOXAPARIN SODIUM 40 MG/0.4ML ~~LOC~~ SOLN
40.0000 mg | Freq: Every day | SUBCUTANEOUS | Status: DC
Start: 2012-03-09 — End: 2012-03-13
  Administered 2012-03-09 – 2012-03-12 (×4): 40 mg via SUBCUTANEOUS
  Filled 2012-03-09 (×5): qty 0.4

## 2012-03-09 MED ORDER — SODIUM CHLORIDE 0.9 % IJ SOLN
3.0000 mL | Freq: Two times a day (BID) | INTRAMUSCULAR | Status: DC
  Administered 2012-03-09 – 2012-03-13 (×8): 3 mL via INTRAVENOUS

## 2012-03-09 MED ORDER — ACETAMINOPHEN 325 MG PO TABS
650.0000 mg | ORAL_TABLET | ORAL | Status: DC | PRN
  Administered 2012-03-13: 650 mg via ORAL
  Filled 2012-03-09: qty 2

## 2012-03-09 MED ORDER — SODIUM CHLORIDE 0.9 % IV SOLN: 250.0000 mL | INTRAVENOUS | Status: DC | PRN

## 2012-03-09 MED ORDER — FUROSEMIDE 10 MG/ML IJ SOLN
40.0000 mg | Freq: Two times a day (BID) | INTRAMUSCULAR | Status: DC
  Administered 2012-03-10 – 2012-03-13 (×8): 40 mg via INTRAVENOUS
  Filled 2012-03-09 (×9): qty 4

## 2012-03-09 MED ORDER — HYDRALAZINE HCL 25 MG PO TABS
25.0000 mg | ORAL_TABLET | Freq: Three times a day (TID) | ORAL | Status: DC
  Administered 2012-03-09 – 2012-03-13 (×12): 25 mg via ORAL
  Filled 2012-03-09 (×14): qty 1

## 2012-03-09 MED ORDER — FUROSEMIDE 10 MG/ML IJ SOLN
40.0000 mg | Freq: Once | INTRAMUSCULAR | Status: AC
  Administered 2012-03-09: 40 mg via INTRAVENOUS
  Filled 2012-03-09: qty 4

## 2012-03-09 MED ORDER — HYDRALAZINE HCL 20 MG/ML IJ SOLN
10.0000 mg | Freq: Four times a day (QID) | INTRAMUSCULAR | Status: DC | PRN
  Filled 2012-03-09: qty 0.5

## 2012-03-09 MED ORDER — ZOLPIDEM TARTRATE 5 MG PO TABS
5.0000 mg | ORAL_TABLET | Freq: Once | ORAL | Status: AC
  Administered 2012-03-09: 5 mg via ORAL
  Filled 2012-03-09: qty 1

## 2012-03-09 MED ORDER — INFLUENZA VIRUS VACC SPLIT PF IM SUSP
0.5000 mL | Freq: Once | INTRAMUSCULAR | Status: AC
  Administered 2012-03-09: 0.5 mL via INTRAMUSCULAR
  Filled 2012-03-09: qty 0.5

## 2012-03-09 MED ORDER — METOPROLOL TARTRATE 100 MG PO TABS
100.0000 mg | ORAL_TABLET | Freq: Two times a day (BID) | ORAL | Status: DC
  Administered 2012-03-09 – 2012-03-13 (×8): 100 mg via ORAL
  Filled 2012-03-09 (×10): qty 1

## 2012-03-09 MED ORDER — ASPIRIN 325 MG PO TABS
325.0000 mg | ORAL_TABLET | Freq: Every day | ORAL | Status: DC
  Administered 2012-03-10 – 2012-03-13 (×4): 325 mg via ORAL
  Filled 2012-03-09 (×4): qty 1

## 2012-03-09 MED ORDER — SODIUM CHLORIDE 0.9 % IJ SOLN: 3.0000 mL | INTRAMUSCULAR | Status: DC | PRN

## 2012-03-09 MED ORDER — ASPIRIN 81 MG PO CHEW
324.0000 mg | CHEWABLE_TABLET | Freq: Once | ORAL | Status: AC
  Administered 2012-03-09: 324 mg via ORAL
  Filled 2012-03-09: qty 4

## 2012-03-09 MED ORDER — ONDANSETRON HCL 4 MG/2ML IJ SOLN: 4.0000 mg | Freq: Four times a day (QID) | INTRAMUSCULAR | Status: DC | PRN

## 2012-03-09 MED ORDER — RAMIPRIL 2.5 MG PO CAPS
2.5000 mg | ORAL_CAPSULE | Freq: Two times a day (BID) | ORAL | Status: DC
  Administered 2012-03-09 – 2012-03-10 (×2): 2.5 mg via ORAL
  Filled 2012-03-09 (×3): qty 1

## 2012-03-09 MED ORDER — NITROGLYCERIN 2 % TD OINT
1.0000 [in_us] | TOPICAL_OINTMENT | Freq: Once | TRANSDERMAL | Status: AC
  Administered 2012-03-09: 1 [in_us] via TOPICAL
  Filled 2012-03-09: qty 30

## 2012-03-09 NOTE — ED Provider Notes (Signed)
History     CSN: [id number]  Arrival date & time 03/09/12  1449   First MD Initiated Contact with Patient 03/09/12 1601      Chief Complaint  Patient presents with  . Shortness of Breath  . Hypertension    HPI  The patient presents with concerns of worsening dyspnea.  He states that he has not been able to afford his medication for several months.  He notes that over the past 2 weeks has had increasing dyspnea with exertion.  He has minimal symptoms at rest, but symptoms are easily provoked with minimal exertion.  He denies concurrent chest pain, lightheadedness.  He states that he's gained 3 pounds since yesterday.  He notes that there is mild associated cough.  Past Medical History  Diagnosis Date  . CHF (congestive heart failure)   . History of hypokalemia   . Obesity   . Hypertension     Past Surgical History  Procedure Date  . Hernia repair     No family history on file.  History  Substance Use Topics  . Smoking status: Never Smoker   . Smokeless tobacco: Not on file  . Alcohol Use: No      Review of Systems  Constitutional:       Per HPI, otherwise negative  HENT:       Per HPI, otherwise negative  Eyes: Negative.   Respiratory:       Per HPI, otherwise negative  Cardiovascular:       Per HPI, otherwise negative  Gastrointestinal: Negative for vomiting.  Genitourinary: Negative.   Musculoskeletal:       Per HPI, otherwise negative  Skin: Negative.   Neurological: Negative for syncope.    Allergies  Review of patient's allergies indicates no known allergies.  Home Medications   Current Outpatient Rx  Name  Route  Sig  Dispense  Refill  . AMLODIPINE BESYLATE 10 MG PO TABS   Oral   Take 10 mg by mouth daily.         . ASPIRIN 81 MG PO TABS   Oral   Take 81 mg by mouth daily.           . FUROSEMIDE 40 MG PO TABS   Oral   Take 40 mg by mouth.           . METOPROLOL TARTRATE 100 MG PO TABS   Oral   Take 100 mg by mouth 2 (two)  times daily.           Marland Kitchen POTASSIUM CHLORIDE 20 MEQ PO PACK   Oral   Take 20 mEq by mouth daily.           Marland Kitchen ROSUVASTATIN CALCIUM 20 MG PO TABS   Oral   Take 20 mg by mouth daily.             BP 188/139  Temp 98.3 F (36.8 C) (Oral)  Resp 22  SpO2 97%  Physical Exam  Nursing note and vitals reviewed. Constitutional: He is oriented to person, place, and time. He appears well-developed. No distress.  HENT:  Head: Normocephalic and atraumatic.  Eyes: Conjunctivae normal and EOM are normal.  Cardiovascular: Normal rate and regular rhythm.   Pulmonary/Chest: Effort normal. No stridor. No respiratory distress. He has decreased breath sounds.  Abdominal: He exhibits no distension.  Musculoskeletal: He exhibits no edema.       Distal lower extremities are both minimally edematous, slightly greater on the left.  Neurological: He is alert and oriented to person, place, and time.  Skin: Skin is warm and dry.  Psychiatric: He has a normal mood and affect.    ED Course  Procedures (including critical care time)  Labs Reviewed  CBC WITH DIFFERENTIAL - Abnormal; Notable for the following:    Hemoglobin 12.1 (*)     HCT 37.5 (*)     All other components within normal limits  BASIC METABOLIC PANEL  PRO B NATRIURETIC PEPTIDE  PROTIME-INR   Dg Chest 2 View  03/09/2012  *RADIOLOGY REPORT*  Clinical Data: Shortness of breath and hypertension.  CHEST - 2 VIEW  Comparison: Chest radiograph 01/17/2011  Findings: Two views of the chest were obtained.  Heart size is upper limits of normal.  There are increased interstitial densities throughout both lungs concerning for interstitial edema.  Trachea is midline.  No evidence for pleural effusions.  The bony thorax appears intact.  There is mild volume loss at the lung bases.  IMPRESSION: Increased interstitial densities bilaterally.  Findings are concerning for interstitial pulmonary edema.  Heart size is also prominent.   Original Report  Authenticated By: Richarda Overlie, M.D.      No diagnosis found.  Cardiac 95 sinus rhythm normal Pulse ox 98% room air normal   Date: 03/09/2012  Rate: 104  Rhythm: sinus tachycardia  QRS Axis: normal  Intervals: normal  ST/T Wave abnormalities: nonspecific T wave changes  Conduction Disutrbances:none  Narrative Interpretation:   Old EKG Reviewed: unchanged  ABNORMAL MDM  This patient with multiple medical problems, including CHF, hypertension, now presents with concerns of ongoing dyspnea.  On exam the patient is in no distress, though he is notably hypertensive with a mean arterial pressure greater than 150.  The patient's labs are suggestive of multiple factorial etiology, with persistent renal dysfunction, elevated BNP.  The patient received Lasix, topical nitroglycerin, and given his persistent symptoms was admitted for further evaluation and management        Gerhard Munch, MD 03/10/12 0001

## 2012-03-09 NOTE — Care Management ED Note (Signed)
       CARE MANAGEMENT ED NOTE 03/09/2012  Patient:  Devon Ortiz, Devon Ortiz   Account Number:  [id number]  Date Initiated:  03/09/2012  Documentation initiated by:  Edd Arbour  Subjective/Objective Assessment:     Subjective/Objective Assessment Detail:   46 year  old male [employer] pcp Dr Concepcion Elk at [facility] MD Dr harding--Dr Vassie Loll is pulmonologist c/o sob/htn ran out of htn medicine, dizzy off/on productive cough unable to afford medicines for several months gained 3 pounds ekg shows sinus tachycardia hgb 12.1 hct 37.5 Pt has his CPAP He states having difficulty with paying for the following medications: Lasix, crestor, Potassium chloride, Norvasc, Aspirin and metoprolol     Action/Plan:   CM discussed and provided written information for www.needymeds.org, discounted pharmacies, outpatient pharmacies, and other [place] resources such as financial assistance, DSS, health department Pt voiced understanding and   Action/Plan Detail:   appreciative of resources Cm spoke with Toni Amend in [place] [facility] to confirm pt is eligible for [place] Pt provided with needymeds.org applications for norvasc, lasix, potassium, crestor and metoprolol   Anticipated DC Date:  03/10/2012     Status Recommendation to Physician:   Result of Recommendation:    Other ED Services  Consult Working Plan    DC Planning Services  Medication Assistance  Other    Choice offered to / List presented to:            Status of service:  Completed, signed off  ED Comments:   ED Comments Detail:

## 2012-03-09 NOTE — Progress Notes (Signed)
[facility] ED CM consulted with Dr Blake Divine about pt

## 2012-03-09 NOTE — Progress Notes (Signed)
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 c/o sob/htn ran out of htn medicine, dizzy off/on productive cough unable to afford medicines for several months gained 3 pounds ekg shows sinus tachycardia hgb 12.1 hct 37.5 Pt has his CPAP He states having difficulty with paying for the following medications: Lasix, crestor, Potassium chloride, Norvasc, Aspirin and metoprolol     Action/Plan:   CM discussed and provided written information for www.needymeds.org, discounted pharmacies, outpatient pharmacies, and other [place] resources such as financial assistance, DSS, health department Pt voiced understanding and   Action/Plan Detail:   appreciative of resources Cm spoke with Toni Amend in [place] pharmacy to confirm pt is eligible for [facility] ind[place] medication assistance program Pt provided with needymeds.org applications for norvasc, lasix, potassium, crestor and metoprolol   Anticipated DC Date:  03/10/2012     Status Recommendation to Physician:   Result of Recommendation:    Other ED Services  Consult Working Plan    DC Planning Services  Medication Assistance  Other   Choice offered to / List presented to:    Status of service:  Completed, signed off ED Comments:   ED Comments Detail:

## 2012-03-09 NOTE — ED Notes (Addendum)
Pt presents with SOB and HTN. Takes blood pressure medication but ran out and insurance does not cover it anymore. Pt states he is a little dizzy off and on since last night. Denies numbness and tingling in extremities.Has a cough that pt states is sometimes productive.

## 2012-03-09 NOTE — Progress Notes (Signed)
Pt reports that he decreased his hours at his job and no longer has [employer] coverage as was listed in EPIC ([place] indicates self pay)
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
## 2012-03-10 DIAGNOSIS — G4710 Hypersomnia, unspecified: Secondary | ICD-10-CM

## 2012-03-10 LAB — BASIC METABOLIC PANEL
BUN: 20 mg/dL (ref 6–23)
Chloride: 104 mEq/L (ref 96–112)
GFR calc Af Amer: 52 mL/min — ABNORMAL LOW (ref >90)
GFR calc non Af Amer: 45 mL/min — ABNORMAL LOW (ref >90)
Potassium: 3 mEq/L — ABNORMAL LOW (ref 3.5–5.1)
Sodium: 141 mEq/L (ref 135–145)

## 2012-03-10 LAB — TROPONIN I
Troponin I: <0.3 ng/mL (ref <0.30)
Troponin I: <0.3 ng/mL (ref <0.30)

## 2012-03-10 LAB — LIPID PANEL
HDL: 33 mg/dL — ABNORMAL LOW (ref >39)
LDL Cholesterol: 84 mg/dL (ref 0–99)
Triglycerides: 83 mg/dL (ref <150)

## 2012-03-10 LAB — HEMOGLOBIN A1C: Hgb A1c MFr Bld: 6 % — ABNORMAL HIGH (ref <5.7)

## 2012-03-10 MED ORDER — ZOLPIDEM TARTRATE 5 MG PO TABS
5.0000 mg | ORAL_TABLET | Freq: Every evening | ORAL | Status: DC | PRN
  Administered 2012-03-10 – 2012-03-12 (×3): 5 mg via ORAL
  Filled 2012-03-10 (×3): qty 1

## 2012-03-10 MED ORDER — ROSUVASTATIN CALCIUM 20 MG PO TABS
20.0000 mg | ORAL_TABLET | Freq: Every day | ORAL | Status: DC
  Administered 2012-03-11 – 2012-03-13 (×3): 20 mg via ORAL
  Filled 2012-03-10 (×3): qty 1

## 2012-03-10 MED ORDER — AMLODIPINE BESYLATE 5 MG PO TABS
5.0000 mg | ORAL_TABLET | Freq: Every day | ORAL | Status: DC
  Administered 2012-03-10 – 2012-03-13 (×4): 5 mg via ORAL
  Filled 2012-03-10 (×4): qty 1

## 2012-03-10 MED ORDER — POTASSIUM CHLORIDE CRYS ER 20 MEQ PO TBCR
40.0000 meq | EXTENDED_RELEASE_TABLET | Freq: Two times a day (BID) | ORAL | Status: AC
  Administered 2012-03-10 (×2): 40 meq via ORAL
  Filled 2012-03-10 (×2): qty 2

## 2012-03-10 NOTE — Progress Notes (Signed)
[facility] PROGRESS NOTE  Devon Ortiz ZOX:[id number] DOB: 1966/01/23 DOA: 03/09/2012 PCP: Dorrene German, MD  Assessment/Plan:  1. Acute on chronic CHF exacerbation: - lasix 40mg  IV BID  - 2 d Echocardiogram is abnormal, showed moderately reduced LVEF.  - serial Cardiac enzymes ARE NEGATIVE.  - daily weights, I/O's  - aspirin, BB and statins  -I/O last 3 completed shifts: In: -  Out: 3175 [Urine:3175] Total I/O In: 480 [P.O.:480] Out: 200 [Urine:200]    2. Hypertensive crisis: resolved.  - resume homemedications.  3. Obesity  4. OSA on CPAP.  5. DVT prophylaxis  Code Status: full code  Family Communication: none at bedside  Disposition Plan: possibly in 2 to 3 days.      HPI/Subjective: comfortable  Objective: Filed Vitals:   03/10/12 0504 03/10/12 0757 03/10/12 0902 03/10/12 1354  BP: 127/87  144/102 150/120  Pulse: 75  88 79  Temp: 98.6 F (37 C)   98.9 F (37.2 C)  TempSrc: Oral   Oral  Resp: 18   18  Height:      Weight:  118.53 kg (261 lb 5 oz)    SpO2: 97%   100%    Intake/Output Summary (Last 24 hours) at 03/10/12 1759 Last data filed at 03/10/12 1454  Gross per 24 hour  Intake    480 ml  Output   3375 ml  Net  -2895 ml   Filed Weights   03/09/12 2250 03/10/12 0757  Weight: 118.253 kg (260 lb 11.2 oz) 118.53 kg (261 lb 5 oz)    Exam: Alert afebrile comfortable Cardiovascular: RRR, S1 normal, S2 normal, no MRG, pulses symmetric and intact bilaterally Pulmonary/Chest: CTAB, no wheezes, OR rhonchi. Abdominal: Soft. Non-tender, non-distended, bowel sounds are normal, no masses, organomegaly, or guarding present.  Musculoskeletal: No joint deformities, erythema, or stiffness, ROM full and no nontender, bilateral pedal edema.     Data Reviewed: Basic Metabolic Panel:  Lab 03/10/12 8119 03/09/12 1620  NA 141 140  K 3.0* 3.7  CL 104 105  CO2 27 26  GLUCOSE 119* 100*  BUN 20 19  CREATININE 1.77* 1.79*  CALCIUM 8.4 8.4  MG --  --  PHOS -- --   Liver Function Tests: No results found for this basename: AST:5,ALT:5,ALKPHOS:5,BILITOT:5,PROT:5,ALBUMIN:5 in the last 168 hours No results found for this basename: LIPASE:5,AMYLASE:5 in the last 168 hours No results found for this basename: AMMONIA:5 in the last 168 hours CBC:  Lab 03/09/12 1620  WBC 5.8  NEUTROABS 4.0  HGB 12.1*  HCT 37.5*  MCV 80.8  PLT 279   Cardiac Enzymes:  Lab 03/10/12 0815 03/10/12 0212 03/09/12 2015  CKTOTAL -- -- --  CKMB -- -- --  CKMBINDEX -- -- --  TROPONINI <0.30 <0.30 <0.30   BNP (last 3 results)  Basename 03/09/12 1620  PROBNP 1384.0*   CBG: No results found for this basename: GLUCAP:5 in the last 168 hours  No results found for this or any previous visit (from the past 240 hour(s)).   Studies: Dg Chest 2 View  03/09/2012  *RADIOLOGY REPORT*  Clinical Data: Shortness of breath and hypertension.  CHEST - 2 VIEW  Comparison: Chest radiograph 01/17/2011  Findings: Two views of the chest were obtained.  Heart size is upper limits of normal.  There are increased interstitial densities throughout both lungs concerning for interstitial edema.  Trachea is midline.  No evidence for pleural effusions.  The bony thorax appears intact.  There is mild volume loss at  the lung bases.  IMPRESSION: Increased interstitial densities bilaterally.  Findings are concerning for interstitial pulmonary edema.  Heart size is also prominent.   Original Report Authenticated By: Richarda Overlie, M.D.     Scheduled Meds:   . amLODipine  5 mg Oral Daily  . aspirin  325 mg Oral Daily  . enoxaparin  40 mg Subcutaneous QHS  . furosemide  40 mg Intravenous Q12H  . [COMPLETED] furosemide  40 mg Intravenous Once  . hydrALAZINE  25 mg Oral Q8H  . [COMPLETED] influenza  inactive virus vaccine  0.5 mL Intramuscular Once  . metoprolol  100 mg Oral BID  . potassium chloride  40 mEq Oral BID  . rosuvastatin  20 mg Oral q1800  . sodium chloride  3 mL Intravenous  Q12H  . [COMPLETED] zolpidem  5 mg Oral Once  . [DISCONTINUED] atorvastatin  40 mg Oral q1800  . [DISCONTINUED] ramipril  2.5 mg Oral Q12H   Continuous Infusions:   Active Problems:          [facility]  [facility] Pager [phone]. If 8PM-8AM, please contact night-coverage at [email or web address], password [facility] 03/10/2012, 5:59 PM  LOS: 1 day

## 2012-03-10 NOTE — Progress Notes (Signed)
*  PRELIMINARY RESULTS* Echocardiogram 2D Echocardiogram has been performed.  Devon Ortiz 03/10/2012, 9:03 AM

## 2012-03-10 NOTE — Progress Notes (Signed)
Patient states that he would not like any assistance setting up his home CPAP machine. RT encouraged him to call if he needed any thing and service response called to check machine.

## 2012-03-11 ENCOUNTER — Inpatient Hospital Stay (HOSPITAL_COMMUNITY): Payer: Self-pay

## 2012-03-11 LAB — BASIC METABOLIC PANEL
CO2: 26 mEq/L (ref 19–32)
Chloride: 103 mEq/L (ref 96–112)
Creatinine, Ser: 1.79 mg/dL — ABNORMAL HIGH (ref 0.50–1.35)
GFR calc Af Amer: 51 mL/min — ABNORMAL LOW (ref >90)
Potassium: 3.4 mEq/L — ABNORMAL LOW (ref 3.5–5.1)
Sodium: 140 mEq/L (ref 135–145)

## 2012-03-11 MED ORDER — POTASSIUM CHLORIDE CRYS ER 20 MEQ PO TBCR
40.0000 meq | EXTENDED_RELEASE_TABLET | Freq: Two times a day (BID) | ORAL | Status: AC
Start: 2012-03-11 — End: 2012-03-11
  Administered 2012-03-11 (×2): 40 meq via ORAL
  Filled 2012-03-11 (×2): qty 2

## 2012-03-11 NOTE — Progress Notes (Signed)
[facility] PROGRESS NOTE  Devon Ortiz YNW:[id number] DOB: 04-18-1966 DOA: 03/09/2012 PCP: Dorrene German, MD  Assessment/Plan:  1. Acute on chronic CHF exacerbation: - lasix 40mg  IV BID  - 2 d Echocardiogram is abnormal, showed moderately reduced LVEF and worsened diastolic dysfunction. He had a stress test last year, which showed no evidence of ischemia.  - serial Cardiac enzymes ARE NEGATIVE.  - daily weights, I/O's  - aspirin, BB and statins  -I/O last 3 completed shifts: In: 480 [P.O.:480] Out: 4575 [Urine:4575] Total I/O In: 240 [P.O.:240] Out: 150 [Urine:150]    2. Hypertensive crisis: resolved.  - resume homemedications.  3. Obesity  4. OSA on CPAP.  5. DVT prophylaxis  Code Status: full code  Family Communication: none at bedside  Disposition Plan: possibly in 2 to 3 days.      HPI/Subjective: comfortable  Objective: Filed Vitals:   03/10/12 2145 03/11/12 0500 03/11/12 0559 03/11/12 0919  BP: 148/97  141/102 144/94  Pulse: 92  80   Temp: 99 F (37.2 C)  98.9 F (37.2 C)   TempSrc: Oral  Oral   Resp: 18  18   Height:      Weight:  118.2 kg (260 lb 9.3 oz) 118.2 kg (260 lb 9.3 oz)   SpO2: 97%  99%     Intake/Output Summary (Last 24 hours) at 03/11/12 1157 Last data filed at 03/11/12 1100  Gross per 24 hour  Intake    480 ml  Output   1350 ml  Net   -870 ml   Filed Weights   03/10/12 0757 03/11/12 0500 03/11/12 0559  Weight: 118.53 kg (261 lb 5 oz) 118.2 kg (260 lb 9.3 oz) 118.2 kg (260 lb 9.3 oz)    Exam: Alert afebrile comfortable Cardiovascular: RRR, S1 normal, S2 normal, no MRG, pulses symmetric and intact bilaterally Pulmonary/Chest: CTAB, no wheezes, OR rhonchi. Abdominal: Soft. Non-tender, non-distended, bowel sounds are normal, no masses, organomegaly, or guarding present.  Musculoskeletal: No joint deformities, erythema, or stiffness, ROM full and no nontender, bilateral pedal edema.     Data Reviewed: Basic Metabolic  Panel:  Lab 03/11/12 0449 03/10/12 0430 03/09/12 1620  NA 140 141 140  K 3.4* 3.0* 3.7  CL 103 104 105  CO2 26 27 26   GLUCOSE 104* 119* 100*  BUN 22 20 19   CREATININE 1.79* 1.77* 1.79*  CALCIUM 8.8 8.4 8.4  MG -- -- --  PHOS -- -- --   Liver Function Tests: No results found for this basename: AST:5,ALT:5,ALKPHOS:5,BILITOT:5,PROT:5,ALBUMIN:5 in the last 168 hours No results found for this basename: LIPASE:5,AMYLASE:5 in the last 168 hours No results found for this basename: AMMONIA:5 in the last 168 hours CBC:  Lab 03/09/12 1620  WBC 5.8  NEUTROABS 4.0  HGB 12.1*  HCT 37.5*  MCV 80.8  PLT 279   Cardiac Enzymes:  Lab 03/10/12 0815 03/10/12 0212 03/09/12 2015  CKTOTAL -- -- --  CKMB -- -- --  CKMBINDEX -- -- --  TROPONINI <0.30 <0.30 <0.30   BNP (last 3 results)  Basename 03/09/12 1620  PROBNP 1384.0*   CBG: No results found for this basename: GLUCAP:5 in the last 168 hours  No results found for this or any previous visit (from the past 240 hour(s)).   Studies: Dg Chest 2 View  03/09/2012  *RADIOLOGY REPORT*  Clinical Data: Shortness of breath and hypertension.  CHEST - 2 VIEW  Comparison: Chest radiograph 01/17/2011  Findings: Two views of the chest were obtained.  Heart size is upper limits of normal.  There are increased interstitial densities throughout both lungs concerning for interstitial edema.  Trachea is midline.  No evidence for pleural effusions.  The bony thorax appears intact.  There is mild volume loss at the lung bases.  IMPRESSION: Increased interstitial densities bilaterally.  Findings are concerning for interstitial pulmonary edema.  Heart size is also prominent.   Original Report Authenticated By: Richarda Overlie, M.D.     Scheduled Meds:    . amLODipine  5 mg Oral Daily  . aspirin  325 mg Oral Daily  . enoxaparin  40 mg Subcutaneous QHS  . furosemide  40 mg Intravenous Q12H  . hydrALAZINE  25 mg Oral Q8H  . metoprolol  100 mg Oral BID  .  [COMPLETED] potassium chloride  40 mEq Oral BID  . potassium chloride  40 mEq Oral BID  . rosuvastatin  20 mg Oral q1800  . sodium chloride  3 mL Intravenous Q12H  . [DISCONTINUED] atorvastatin  40 mg Oral q1800   Continuous Infusions:   Active Problems:          [facility]  [facility] Pager [phone]. If 8PM-8AM, please contact night-coverage at [email or web address], password [facility] 03/11/2012, 11:57 AM  LOS: 2 days

## 2012-03-11 NOTE — Plan of Care (Signed)
Problem: Phase I Progression Outcomes Goal: EF % per last Echo/documented,Core Reminder form on chart Outcome: Completed/Met Date Met:  03/11/12 40%-45%

## 2012-03-11 NOTE — Progress Notes (Signed)
Spoke with pt in regards to home CPAP machine, equipment at bedside. pt is comfortable self administering. Understands to call for RT if any complications arise.

## 2012-03-12 LAB — BASIC METABOLIC PANEL
BUN: 23 mg/dL (ref 6–23)
Calcium: 8.8 mg/dL (ref 8.4–10.5)
Creatinine, Ser: 1.81 mg/dL — ABNORMAL HIGH (ref 0.50–1.35)
GFR calc Af Amer: 50 mL/min — ABNORMAL LOW (ref >90)
GFR calc non Af Amer: 44 mL/min — ABNORMAL LOW (ref >90)
Potassium: 3.6 mEq/L (ref 3.5–5.1)

## 2012-03-12 MED ORDER — ISOSORBIDE MONONITRATE ER 30 MG PO TB24
30.0000 mg | ORAL_TABLET | Freq: Every day | ORAL | Status: DC
  Administered 2012-03-12 – 2012-03-13 (×2): 30 mg via ORAL
  Filled 2012-03-12 (×2): qty 1

## 2012-03-12 NOTE — Progress Notes (Signed)
RT checked with patient. He stated that he did not need any help with his home CPAP unit. RT encouraged him to call if he needed assistance at any time.

## 2012-03-12 NOTE — Progress Notes (Signed)
[facility] PROGRESS NOTE  Devon Ortiz ZOX:[id number] DOB: 1966/01/20 DOA: 03/09/2012 PCP: Dorrene German, MD  Assessment/Plan:  1. Acute on chronic CHF exacerbation: - lasix 40mg  IV BID  - 2 d Echocardiogram is abnormal, showed moderately reduced LVEF and worsened diastolic dysfunction. He had a stress test last year, which showed no evidence of ischemia.  - serial Cardiac enzymes ARE NEGATIVE.  - daily weights, I/O's  - aspirin, BB and statins  -I/O last 3 completed shifts: In: 1980 [P.O.:1980] Out: 2950 [Urine:2950]   Cardiology consult called recommended adding imdur.    2. Hypertensive crisis: resolved.  - resume homemedications.  3. Obesity  4. OSA on CPAP.  5. DVT prophylaxis  6. CKD stage 3: slightly worsened from lasix. Will continue to monitor.  Code Status: full code  Family Communication: none at bedside  Disposition Plan: possibly in 2 to 3 days.      HPI/Subjective: comfortable  Objective: Filed Vitals:   03/12/12 0922 03/12/12 1253 03/12/12 1455 03/12/12 1728  BP: 151/116 125/98 125/77 118/83  Pulse: 87 80 83 80  Temp:   98.1 F (36.7 C)   TempSrc:   Oral   Resp:   18   Height:      Weight:      SpO2:   99%     Intake/Output Summary (Last 24 hours) at 03/12/12 1929 Last data filed at 03/12/12 1900  Gross per 24 hour  Intake   1500 ml  Output   2050 ml  Net   -550 ml   Filed Weights   03/11/12 0500 03/11/12 0559 03/12/12 0526  Weight: 118.2 kg (260 lb 9.3 oz) 118.2 kg (260 lb 9.3 oz) 118.8 kg (261 lb 14.5 oz)    Exam: Alert afebrile comfortable Cardiovascular: RRR, S1 normal, S2 normal, no MRG, pulses symmetric and intact bilaterally Pulmonary/Chest: CTAB, no wheezes, OR rhonchi. Abdominal: Soft. Non-tender, non-distended, bowel sounds are normal, no masses, organomegaly, or guarding present.  Musculoskeletal: No joint deformities, erythema, or stiffness, ROM full and no nontender, bilateral pedal edema.     Data  Reviewed: Basic Metabolic Panel:  Lab 03/12/12 8119 03/11/12 0449 03/10/12 0430 03/09/12 1620  NA 138 140 141 140  K 3.6 3.4* 3.0* 3.7  CL 102 103 104 105  CO2 25 26 27 26   GLUCOSE 107* 104* 119* 100*  BUN 23 22 20 19   CREATININE 1.81* 1.79* 1.77* 1.79*  CALCIUM 8.8 8.8 8.4 8.4  MG 2.1 -- -- --  PHOS -- -- -- --   Liver Function Tests: No results found for this basename: AST:5,ALT:5,ALKPHOS:5,BILITOT:5,PROT:5,ALBUMIN:5 in the last 168 hours No results found for this basename: LIPASE:5,AMYLASE:5 in the last 168 hours No results found for this basename: AMMONIA:5 in the last 168 hours CBC:  Lab 03/09/12 1620  WBC 5.8  NEUTROABS 4.0  HGB 12.1*  HCT 37.5*  MCV 80.8  PLT 279   Cardiac Enzymes:  Lab 03/10/12 0815 03/10/12 0212 03/09/12 2015  CKTOTAL -- -- --  CKMB -- -- --  CKMBINDEX -- -- --  TROPONINI <0.30 <0.30 <0.30   BNP (last 3 results)  Basename 03/09/12 1620  PROBNP 1384.0*   CBG: No results found for this basename: GLUCAP:5 in the last 168 hours  No results found for this or any previous visit (from the past 240 hour(s)).   Studies: Dg Chest 2 View  03/11/2012  *RADIOLOGY REPORT*  Clinical Data: Pulmonary edema.  CHEST - 2 VIEW  Comparison: 03/09/2012.  Findings: Cardiomegaly.  Mediastinal contours appear within normal limits.  Pulmonary vascular congestion.  Decreasing pulmonary edema is present.  Mild basilar atelectasis.  No effusion is identified on the lateral view. No focal airspace disease.  IMPRESSION: Improving aeration with decreasing pulmonary edema.  Persistent cardiomegaly and pulmonary vascular congestion.   Original Report Authenticated By: Andreas Newport, M.D.     Scheduled Meds:    . amLODipine  5 mg Oral Daily  . aspirin  325 mg Oral Daily  . enoxaparin  40 mg Subcutaneous QHS  . furosemide  40 mg Intravenous Q12H  . hydrALAZINE  25 mg Oral Q8H  . isosorbide mononitrate  30 mg Oral Daily  . metoprolol  100 mg Oral BID  . [COMPLETED]  potassium chloride  40 mEq Oral BID  . rosuvastatin  20 mg Oral q1800  . sodium chloride  3 mL Intravenous Q12H   Continuous Infusions:   Active Problems:          [facility]  [facility] Pager [phone]. If 8PM-8AM, please contact night-coverage at [email or web address], password [facility] 03/12/2012, 7:29 PM  LOS: 3 days
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
## 2012-03-13 DIAGNOSIS — I5031 Acute diastolic (congestive) heart failure: Secondary | ICD-10-CM

## 2012-03-13 MED ORDER — POTASSIUM CHLORIDE 20 MEQ PO PACK: 20.0000 meq | PACK | Freq: Every day | ORAL | Status: DC

## 2012-03-13 MED ORDER — ISOSORBIDE MONONITRATE ER 30 MG PO TB24: 30.0000 mg | ORAL_TABLET | Freq: Every day | ORAL | Status: DC

## 2012-03-13 MED ORDER — PRAVASTATIN SODIUM 40 MG PO TABS: 40.0000 mg | ORAL_TABLET | Freq: Every day | ORAL | Status: DC

## 2012-03-13 MED ORDER — METOPROLOL TARTRATE 100 MG PO TABS: 100.0000 mg | ORAL_TABLET | Freq: Two times a day (BID) | ORAL | Status: DC

## 2012-03-13 MED ORDER — AMLODIPINE BESYLATE 5 MG PO TABS: 5.0000 mg | ORAL_TABLET | Freq: Every day | ORAL | Status: DC

## 2012-03-13 MED ORDER — HYDRALAZINE HCL 25 MG PO TABS: 37.5000 mg | ORAL_TABLET | Freq: Two times a day (BID) | ORAL | Status: DC

## 2012-03-13 MED ORDER — FUROSEMIDE 40 MG PO TABS: 40.0000 mg | ORAL_TABLET | Freq: Two times a day (BID) | ORAL | Status: DC

## 2012-03-13 NOTE — Discharge Summary (Signed)
Physician Discharge Summary  Devon Ortiz ZOX:[id number] DOB: 1966-04-12 DOA: 03/09/2012  PCP: Dorrene German, MD  Admit date: 03/09/2012 Discharge date: 03/13/2012  Time spent: 52 minutes  Recommendations for Outpatient Follow-up:  1. Follow up with PCP in 2 weeks.  Discharge Diagnoses:  Acute diastolic  CHF exacerbation Hypertension OSA    Discharge Condition: stable  Diet recommendation: low sodium diet.  Filed Weights   03/11/12 0559 03/12/12 0526 03/13/12 0610  Weight: 118.2 kg (260 lb 9.3 oz) 118.8 kg (261 lb 14.5 oz) 117.3 kg (258 lb 9.6 oz)    History of present illness:  Devon Ortiz is a 46 y.o. male with prior h/o Hypertension, CHF, came in for DOE associated with cough with productive sputum, since 2 to 3 weeks. He denies any fever or chills. He reports occasional chest pain on exertion. He also reports he has been out of his medications for more than 3 months due to financial reasons . On arrival to ED, he was found to have hypertensive crisis and CXR shows pulmonary edema. He is admitted for acute chronic systolic heart failure and hypertensive crisis.    Hospital Course:   1. Acute on chronic CHF exacerbation: - he was started on  lasix 40mg  IV BID  - 2 d Echocardiogram is abnormal, showed moderately reduced LVEF and worsened diastolic dysfunction. He had a stress test last year, which showed no evidence of ischemia.  - serial Cardiac enzymes ARE NEGATIVE.  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 He subsequently had an echo on 01/20/11 which demonstrated an EF of 45%-50%, but with abnormal left ventricular relaxation. Pt then underwent cardiac cathetrization on 01/21/11. He had EDP of 24 with totally normal codominant cyst in the large dilated coronary  arteries with no evidence of ischemia.  Cardiology consult called recommended adding imdur.   2. Hypertensive crisis: resolved.  - resume homemedications.  3. Obesity  4. OSA on CPAP.  5. DVT prophylaxis  6. CKD stage 3: slightly worsened from lasix. Recommend further outpatient follow up with PCP.   Consultations:  cardiology  Discharge Exam: Filed Vitals:   03/12/12 2040 03/13/12 0610 03/13/12 1032 03/13/12 1456  BP: 125/79 137/94 133/91 115/76  Pulse: 78 98 82 77  Temp: 97.9 F (36.6 C) 97.7 F (36.5 C)  97.8 F (36.6 C)  TempSrc: Oral Oral  Oral  Resp: 20 18  18   Height:      Weight:  117.3 kg (258 lb 9.6 oz)    SpO2: 98% 100%  100%   Alert afebrile comfortable  Cardiovascular: RRR, S1 normal, S2 normal, no MRG, pulses symmetric and intact bilaterally Pulmonary/Chest: CTAB, no wheezes, OR rhonchi. Abdominal: Soft. Non-tender, non-distended, bowel sounds are normal, no masses, organomegaly, or guarding present.  Musculoskeletal: No joint deformities, erythema, or stiffness, ROM full and no nontender, bilateral pedal edema.       Discharge Instructions     Medication List     As of 03/13/2012  3:38 PM    STOP taking these medications         rosuvastatin 20 MG tablet   Commonly known as: CRESTOR      TAKE these medications         amLODipine 5 MG tablet   Commonly known as:  NORVASC   Take 1 tablet (5 mg total) by mouth daily.      aspirin 81 MG tablet   Take 81 mg by mouth daily.      furosemide 40 MG tablet   Commonly known as: LASIX   Take 1 tablet (40 mg total) by mouth 2 (two) times daily.      hydrALAZINE 25 MG tablet   Commonly known as: APRESOLINE   Take 1.5 tablets (37.5 mg total) by mouth 2 (two) times daily.      isosorbide mononitrate 30 MG 24 hr tablet   Commonly known as: IMDUR   Take 1 tablet (30 mg total) by mouth daily.      metoprolol 100 MG tablet   Commonly known as: LOPRESSOR   Take 1 tablet (100 mg total) by mouth 2  (two) times daily.      potassium chloride 20 MEQ packet   Commonly known as: KLOR-CON   Take 20 mEq by mouth daily.      pravastatin 40 MG tablet   Commonly known as: PRAVACHOL   Take 1 tablet (40 mg total) by mouth daily.          The results of significant diagnostics from this hospitalization (including imaging, microbiology, ancillary and laboratory) are listed below for reference.    Significant Diagnostic Studies: Dg Chest 2 View  03/11/2012  *RADIOLOGY REPORT*  Clinical Data: Pulmonary edema.  CHEST - 2 VIEW  Comparison: 03/09/2012.  Findings: Cardiomegaly.  Mediastinal contours appear within normal limits.  Pulmonary vascular congestion.  Decreasing pulmonary edema is present.  Mild basilar atelectasis.  No effusion is identified on the lateral view. No focal airspace disease.  IMPRESSION: Improving aeration with decreasing pulmonary edema.  Persistent cardiomegaly and pulmonary vascular congestion.   Original Report Authenticated By: Andreas Newport, M.D.    Dg Chest 2 View  03/09/2012  *RADIOLOGY REPORT*  Clinical Data: Shortness of breath and hypertension.  CHEST - 2 VIEW  Comparison: Chest radiograph 01/17/2011  Findings: Two views of the chest were obtained.  Heart size is upper limits of normal.  There are increased interstitial densities throughout both lungs concerning for interstitial edema.  Trachea is midline.  No evidence for pleural effusions.  The bony thorax appears intact.  There is mild volume loss at the lung bases.  IMPRESSION: Increased interstitial densities bilaterally.  Findings are concerning for interstitial pulmonary edema.  Heart size is also prominent.   Original Report Authenticated By: Richarda Overlie, M.D.     Microbiology: No results found for this or any previous visit (from the past 240 hour(s)).   Labs: Basic Metabolic Panel:  Lab 03/12/12 4098 03/11/12 0449 03/10/12 0430 03/09/12 1620  NA 138 140 141 140  K 3.6 3.4* 3.0* 3.7  CL 102 103 104  105  CO2 25 26 27 26   GLUCOSE 107* 104* 119* 100*  BUN 23 22 20 19   CREATININE 1.81* 1.79* 1.77* 1.79*  CALCIUM 8.8 8.8 8.4 8.4  MG 2.1 -- -- --  PHOS -- -- -- --   Liver Function Tests: No results found for this basename: AST:5,ALT:5,ALKPHOS:5,BILITOT:5,PROT:5,ALBUMIN:5 in the last 168 hours No results found for this basename: LIPASE:5,AMYLASE:5 in the last 168 hours No results found for this basename: AMMONIA:5 in the last 168 hours CBC:  Lab 03/09/12 1620  WBC 5.8  NEUTROABS 4.0  HGB 12.1*  HCT 37.5*  MCV 80.8  PLT 279   Cardiac Enzymes:  Lab 03/10/12 0815 03/10/12 0212 03/09/12 2015  CKTOTAL -- -- --  CKMB -- -- --  CKMBINDEX -- -- --  TROPONINI <0.30 <0.30 <0.30   BNP: BNP (last 3 results)  Basename 03/13/12 0500 03/09/12 1620  PROBNP 541.9* 1384.0*   CBG: No results found for this basename: GLUCAP:5 in the last 168 hours     Signed:  Blasa Raisch  [facility] 03/13/2012, 3:38 PM

## 2012-07-24 ENCOUNTER — Encounter (HOSPITAL_COMMUNITY): Payer: Self-pay | Admitting: Emergency Medicine

## 2012-07-24 ENCOUNTER — Emergency Department (HOSPITAL_COMMUNITY): Payer: Self-pay

## 2012-07-24 ENCOUNTER — Inpatient Hospital Stay (HOSPITAL_COMMUNITY)
Admission: EM | Admit: 2012-07-24 | Discharge: 2012-07-31 | DRG: 291 | Disposition: A | Discharge status: Home / Self Care | Payer: MEDICAID | Attending: Internal Medicine | Admitting: Internal Medicine

## 2012-07-24 DIAGNOSIS — N184 Chronic kidney disease, stage 4 (severe): Secondary | ICD-10-CM | POA: Diagnosis present

## 2012-07-24 DIAGNOSIS — I129 Hypertensive chronic kidney disease with stage 1 through stage 4 chronic kidney disease, or unspecified chronic kidney disease: Secondary | ICD-10-CM | POA: Diagnosis present

## 2012-07-24 DIAGNOSIS — Z79899 Other long term (current) drug therapy: Secondary | ICD-10-CM

## 2012-07-24 DIAGNOSIS — R0602 Shortness of breath: Secondary | ICD-10-CM

## 2012-07-24 DIAGNOSIS — Z7982 Long term (current) use of aspirin: Secondary | ICD-10-CM

## 2012-07-24 DIAGNOSIS — Z9889 Other specified postprocedural states: Secondary | ICD-10-CM

## 2012-07-24 DIAGNOSIS — I428 Other cardiomyopathies: Secondary | ICD-10-CM

## 2012-07-24 DIAGNOSIS — N179 Acute kidney failure, unspecified: Secondary | ICD-10-CM

## 2012-07-24 DIAGNOSIS — I5043 Acute on chronic combined systolic (congestive) and diastolic (congestive) heart failure: Principal | ICD-10-CM

## 2012-07-24 DIAGNOSIS — J189 Pneumonia, unspecified organism: Secondary | ICD-10-CM | POA: Diagnosis present

## 2012-07-24 DIAGNOSIS — N189 Chronic kidney disease, unspecified: Secondary | ICD-10-CM | POA: Diagnosis present

## 2012-07-24 DIAGNOSIS — E669 Obesity, unspecified: Secondary | ICD-10-CM | POA: Diagnosis present

## 2012-07-24 DIAGNOSIS — R1110 Vomiting, unspecified: Secondary | ICD-10-CM

## 2012-07-24 DIAGNOSIS — E8779 Other fluid overload: Secondary | ICD-10-CM | POA: Diagnosis present

## 2012-07-24 DIAGNOSIS — A0811 Acute gastroenteropathy due to Norwalk agent: Secondary | ICD-10-CM | POA: Diagnosis present

## 2012-07-24 DIAGNOSIS — I509 Heart failure, unspecified: Secondary | ICD-10-CM

## 2012-07-24 DIAGNOSIS — G4733 Obstructive sleep apnea (adult) (pediatric): Secondary | ICD-10-CM | POA: Diagnosis present

## 2012-07-24 DIAGNOSIS — N183 Chronic kidney disease, stage 3 unspecified: Secondary | ICD-10-CM

## 2012-07-24 DIAGNOSIS — Z6841 Body Mass Index (BMI) 40.0 and over, adult: Secondary | ICD-10-CM

## 2012-07-24 DIAGNOSIS — E876 Hypokalemia: Secondary | ICD-10-CM

## 2012-07-24 HISTORY — DX: Chronic kidney disease, stage 3 unspecified: N18.30

## 2012-07-24 HISTORY — DX: Chronic kidney disease, stage 3 (moderate): N18.3

## 2012-07-24 LAB — URINALYSIS, ROUTINE W REFLEX MICROSCOPIC
Bilirubin Urine: NEGATIVE
Ketones, ur: NEGATIVE mg/dL
Leukocytes, UA: NEGATIVE
Nitrite: NEGATIVE
Specific Gravity, Urine: 1.017 (ref 1.005–1.030)
Urobilinogen, UA: 2 mg/dL — ABNORMAL HIGH (ref 0.0–1.0)
pH: 5.5 (ref 5.0–8.0)

## 2012-07-24 LAB — CBC
MCH: 23.8 pg — ABNORMAL LOW (ref 26.0–34.0)
MCHC: 31.4 g/dL (ref 30.0–36.0)
MCV: 75.6 fL — ABNORMAL LOW (ref 78.0–100.0)
Platelets: 240 10*3/uL (ref 150–400)
RDW: 16.1 % — ABNORMAL HIGH (ref 11.5–15.5)
WBC: 7.8 10*3/uL (ref 4.0–10.5)

## 2012-07-24 LAB — URINE MICROSCOPIC-ADD ON

## 2012-07-24 LAB — PROTIME-INR
INR: 1.39 (ref 0.00–1.49)
Prothrombin Time: 16.7 seconds — ABNORMAL HIGH (ref 11.6–15.2)

## 2012-07-24 LAB — BASIC METABOLIC PANEL
Calcium: 8.7 mg/dL (ref 8.4–10.5)
Creatinine, Ser: 2.26 mg/dL — ABNORMAL HIGH (ref 0.50–1.35)
GFR calc Af Amer: 39 mL/min — ABNORMAL LOW (ref >90)
GFR calc non Af Amer: 33 mL/min — ABNORMAL LOW (ref >90)

## 2012-07-24 LAB — PRO B NATRIURETIC PEPTIDE: Pro B Natriuretic peptide (BNP): 6505 pg/mL — ABNORMAL HIGH (ref 0–125)

## 2012-07-24 LAB — TROPONIN I: Troponin I: <0.3 ng/mL (ref <0.30)

## 2012-07-24 MED ORDER — POLYETHYLENE GLYCOL 3350 17 G PO PACK
17.0000 g | PACK | Freq: Every day | ORAL | Status: DC | PRN
  Filled 2012-07-24: qty 1

## 2012-07-24 MED ORDER — ALBUTEROL SULFATE (5 MG/ML) 0.5% IN NEBU: 2.5000 mg | INHALATION_SOLUTION | RESPIRATORY_TRACT | Status: DC | PRN

## 2012-07-24 MED ORDER — HEPARIN SODIUM (PORCINE) 5000 UNIT/ML IJ SOLN
5000.0000 [IU] | Freq: Three times a day (TID) | INTRAMUSCULAR | Status: DC
  Administered 2012-07-24 – 2012-07-31 (×20): 5000 [IU] via SUBCUTANEOUS
  Filled 2012-07-24 (×23): qty 1

## 2012-07-24 MED ORDER — CLONIDINE HCL 0.1 MG PO TABS
0.1000 mg | ORAL_TABLET | Freq: Four times a day (QID) | ORAL | Status: DC | PRN
  Administered 2012-07-24 – 2012-07-29 (×2): 0.1 mg via ORAL
  Filled 2012-07-24 (×2): qty 1

## 2012-07-24 MED ORDER — SODIUM CHLORIDE 0.9 % IV SOLN: 250.0000 mL | INTRAVENOUS | Status: DC | PRN

## 2012-07-24 MED ORDER — NITROGLYCERIN 2 % TD OINT
1.0000 [in_us] | TOPICAL_OINTMENT | Freq: Once | TRANSDERMAL | Status: AC
  Administered 2012-07-24: 1 [in_us] via TOPICAL
  Filled 2012-07-24: qty 30

## 2012-07-24 MED ORDER — NITROGLYCERIN 2 % TD OINT
0.5000 [in_us] | TOPICAL_OINTMENT | Freq: Four times a day (QID) | TRANSDERMAL | Status: DC
  Administered 2012-07-25 – 2012-07-28 (×14): 0.5 [in_us] via TOPICAL
  Filled 2012-07-24: qty 30

## 2012-07-24 MED ORDER — FUROSEMIDE 10 MG/ML IJ SOLN
60.0000 mg | Freq: Two times a day (BID) | INTRAMUSCULAR | Status: DC
  Administered 2012-07-24 – 2012-07-30 (×12): 60 mg via INTRAVENOUS
  Filled 2012-07-24 (×14): qty 6

## 2012-07-24 MED ORDER — HYDRALAZINE HCL 25 MG PO TABS
37.5000 mg | ORAL_TABLET | Freq: Two times a day (BID) | ORAL | Status: DC
  Administered 2012-07-24 – 2012-07-31 (×14): 37.5 mg via ORAL
  Filled 2012-07-24 (×15): qty 1.5

## 2012-07-24 MED ORDER — SODIUM CHLORIDE 0.9 % IJ SOLN
3.0000 mL | Freq: Two times a day (BID) | INTRAMUSCULAR | Status: DC
  Administered 2012-07-24 – 2012-07-29 (×7): 3 mL via INTRAVENOUS

## 2012-07-24 MED ORDER — ASPIRIN EC 81 MG PO TBEC
81.0000 mg | DELAYED_RELEASE_TABLET | Freq: Every day | ORAL | Status: DC
  Administered 2012-07-24 – 2012-07-31 (×8): 81 mg via ORAL
  Filled 2012-07-24 (×8): qty 1

## 2012-07-24 MED ORDER — SODIUM CHLORIDE 0.9 % IJ SOLN
3.0000 mL | INTRAMUSCULAR | Status: DC | PRN
  Administered 2012-07-30: 3 mL via INTRAVENOUS

## 2012-07-24 MED ORDER — ADULT MULTIVITAMIN W/MINERALS CH
1.0000 | ORAL_TABLET | Freq: Every day | ORAL | Status: DC
  Administered 2012-07-25 – 2012-07-31 (×7): 1 via ORAL
  Filled 2012-07-24 (×7): qty 1

## 2012-07-24 MED ORDER — SODIUM CHLORIDE 0.9 % IJ SOLN
3.0000 mL | Freq: Two times a day (BID) | INTRAMUSCULAR | Status: DC
  Administered 2012-07-24 – 2012-07-25 (×4): 3 mL via INTRAVENOUS
  Administered 2012-07-27: 23:00:00 via INTRAVENOUS
  Administered 2012-07-28 – 2012-07-31 (×4): 3 mL via INTRAVENOUS

## 2012-07-24 MED ORDER — ONDANSETRON HCL 4 MG/2ML IJ SOLN: 4.0000 mg | Freq: Four times a day (QID) | INTRAMUSCULAR | Status: DC | PRN

## 2012-07-24 MED ORDER — SIMVASTATIN 20 MG PO TABS
20.0000 mg | ORAL_TABLET | Freq: Every day | ORAL | Status: DC
  Administered 2012-07-25 – 2012-07-30 (×6): 20 mg via ORAL
  Filled 2012-07-24 (×7): qty 1

## 2012-07-24 MED ORDER — ONDANSETRON HCL 4 MG PO TABS: 4.0000 mg | ORAL_TABLET | Freq: Four times a day (QID) | ORAL | Status: DC | PRN

## 2012-07-24 MED ORDER — FUROSEMIDE 10 MG/ML IJ SOLN
80.0000 mg | Freq: Once | INTRAMUSCULAR | Status: AC
  Administered 2012-07-24: 80 mg via INTRAVENOUS
  Filled 2012-07-24: qty 8

## 2012-07-24 MED ORDER — GUAIFENESIN-DM 100-10 MG/5ML PO SYRP
5.0000 mL | ORAL_SOLUTION | ORAL | Status: DC | PRN
  Administered 2012-07-25: 5 mL via ORAL
  Filled 2012-07-24: qty 10

## 2012-07-24 MED ORDER — METOPROLOL TARTRATE 1 MG/ML IV SOLN
5.0000 mg | INTRAVENOUS | Status: DC | PRN
  Administered 2012-07-24: 5 mg via INTRAVENOUS
  Filled 2012-07-24 (×2): qty 5

## 2012-07-24 MED ORDER — POTASSIUM CHLORIDE CRYS ER 20 MEQ PO TBCR
20.0000 meq | EXTENDED_RELEASE_TABLET | Freq: Every day | ORAL | Status: DC
  Administered 2012-07-25 – 2012-07-26 (×2): 20 meq via ORAL
  Filled 2012-07-24 (×3): qty 1

## 2012-07-24 MED ORDER — METOPROLOL TARTRATE 100 MG PO TABS
100.0000 mg | ORAL_TABLET | Freq: Two times a day (BID) | ORAL | Status: DC
  Administered 2012-07-25 – 2012-07-31 (×13): 100 mg via ORAL
  Filled 2012-07-24 (×14): qty 1

## 2012-07-24 MED ORDER — POTASSIUM CHLORIDE CRYS ER 20 MEQ PO TBCR
40.0000 meq | EXTENDED_RELEASE_TABLET | Freq: Once | ORAL | Status: DC
  Filled 2012-07-24: qty 2

## 2012-07-24 MED ORDER — HYDROCODONE-ACETAMINOPHEN 5-325 MG PO TABS
1.0000 | ORAL_TABLET | ORAL | Status: DC | PRN
  Administered 2012-07-24 – 2012-07-25 (×2): 2 via ORAL
  Filled 2012-07-24 (×3): qty 2

## 2012-07-24 MED ORDER — POTASSIUM CHLORIDE 20 MEQ PO PACK: 20.0000 meq | PACK | Freq: Every day | ORAL | Status: DC

## 2012-07-24 MED ORDER — AMLODIPINE BESYLATE 5 MG PO TABS
5.0000 mg | ORAL_TABLET | Freq: Every day | ORAL | Status: DC
  Administered 2012-07-24 – 2012-07-31 (×8): 5 mg via ORAL
  Filled 2012-07-24 (×8): qty 1

## 2012-07-24 NOTE — ED Notes (Signed)
PMH CHF.  Pt states he has been having increasing edema and SOB since weekend before last.  States he also has begun to have n/v/d since Sunday.  Pt states he is unable to keep down fluids and medications.
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
## 2012-07-25 ENCOUNTER — Encounter (HOSPITAL_COMMUNITY): Payer: Self-pay | Admitting: Cardiology

## 2012-07-25 DIAGNOSIS — E876 Hypokalemia: Secondary | ICD-10-CM | POA: Diagnosis present

## 2012-07-25 LAB — OSMOLALITY, URINE: Osmolality, Ur: 389 mosm/kg — ABNORMAL LOW (ref 390–1090)

## 2012-07-25 LAB — BASIC METABOLIC PANEL
Calcium: 8.5 mg/dL (ref 8.4–10.5)
GFR calc Af Amer: 40 mL/min — ABNORMAL LOW (ref >90)
GFR calc non Af Amer: 34 mL/min — ABNORMAL LOW (ref >90)
Potassium: 3.1 mEq/L — ABNORMAL LOW (ref 3.5–5.1)
Sodium: 136 mEq/L (ref 135–145)

## 2012-07-25 LAB — GI PATHOGEN PANEL BY PCR, STOOL
C difficile toxin A/B: NEGATIVE
Campylobacter by PCR: NEGATIVE
Cryptosporidium by PCR: NEGATIVE
E coli (ETEC) LT/ST: NEGATIVE
E coli (STEC): NEGATIVE
E coli 0157 by PCR: NEGATIVE
G lamblia by PCR: NEGATIVE
Norovirus GI/GII: POSITIVE
Rotavirus A by PCR: NEGATIVE
Salmonella by PCR: NEGATIVE
Shigella by PCR: NEGATIVE

## 2012-07-25 LAB — SODIUM, URINE, RANDOM: Sodium, Ur: 60 mEq/L

## 2012-07-25 LAB — CBC
Hemoglobin: 11.1 g/dL — ABNORMAL LOW (ref 13.0–17.0)
MCHC: 32.7 g/dL (ref 30.0–36.0)
Platelets: 229 10*3/uL (ref 150–400)
RBC: 4.5 MIL/uL (ref 4.22–5.81)

## 2012-07-25 LAB — TSH: TSH: 0.673 u[IU]/mL (ref 0.350–4.500)

## 2012-07-25 LAB — OSMOLALITY: Osmolality: 287 mosm/kg (ref 275–300)

## 2012-07-25 MED ORDER — ZOLPIDEM TARTRATE 5 MG PO TABS
5.0000 mg | ORAL_TABLET | Freq: Once | ORAL | Status: AC
  Administered 2012-07-25: 5 mg via ORAL
  Filled 2012-07-25: qty 1

## 2012-07-25 NOTE — Progress Notes (Signed)
07/25/12-21:25-Report called from lab that pt's stool was positive for Norovirus-Pt is already on isolation-[facility]. Will inform pt of results.

## 2012-07-25 NOTE — Progress Notes (Addendum)
Pt seen and was offered assistance with his cpap machine from home.  Pt did not bring his mask and tubing, therefore both were provided for him.  Pt stated he was fine, didn't need help and would put it on himself later.  Machine turned on, and it appeared to be working well, no frays or defects noted on cord.  Service response call already made earlier in shift requesting [employer] to inspect machine.  Sterile water added to humidity chamber.  Pt was advised that RT available all night should he need further assistance.

## 2012-07-25 NOTE — Progress Notes (Signed)
  Echocardiogram 2D Echocardiogram has been performed.  Jorje Guild 07/25/2012, 8:49 AM

## 2012-07-25 NOTE — Progress Notes (Signed)
Rt called service response to check out home CPAP unit.
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
## 2012-07-25 NOTE — Progress Notes (Signed)
[facility] PROGRESS NOTE  Devon Ortiz ZOX:[id number] DOB: 1966/04/02 DOA: 07/24/2012 PCP: Dorrene German, MD  Assessment/Plan: 1. Shortness of breath due to acute on chronic combined systolic and diastolic heart failure. Last echo shows grade 3 diastolic dysfunction and EF of around 35%, patient will be admitted to telemetry bed, sodium fluid restriction, nitro paste, IV Lasix, beta blocker, will not be able to give ACE/ARB due to acute renal insufficiency.  I/O last 3 completed shifts: In: 2280 [P.O.:2280] Out: 4550 [Urine:4550]     Cardiology consult appreciated.   2. Acute renal failure on chronic kidney disease stage IV. Baseline creatinine appears to be around 1.75, at this time clear evidence of massive fluid overload especially in the lower extremities, IV Lasix, obtain urine electrolytes, repeat BMP in the morning. Avoid ACE/ARB, NSAIDs, nephrotoxins.   3. Hypertension in poor control, home medications will be continued, will add when necessary IV Lopressor and Catapres as needed. With diuresis and shortness of breath being addressed blood pressure should improve.   4. Mild gastroenteritis induced diarrheal illness. He is improving gradually over the course of 3 days, today had 2 loose bowel movements only, no exposure to sick contacts her antibiotics, provide and crit precautions,.  DVT Prophylaxis Heparin   Family Communication: Admission, patients condition and plan of care including tests being ordered have been discussed with the patient who indicates understanding and agree with the plan and Code Status.  Code Status Full     Consultants:  cardiology  HPI/Subjective: Sob imprved.   Objective: Filed Vitals:   07/25/12 0500 07/25/12 0612 07/25/12 1118 07/25/12 1448  BP:  123/90 154/116 136/94  Pulse:  87 104 91  Temp:  98.5 F (36.9 C)  98.6 F (37 C)  TempSrc:  Oral  Oral  Resp:  20  20  Height:      Weight: 127.098 kg (280 lb 3.2 oz)     SpO2:   100%  100%    Intake/Output Summary (Last 24 hours) at 07/25/12 1910 Last data filed at 07/25/12 1727  Gross per 24 hour  Intake   2280 ml  Output   4000 ml  Net  -1720 ml   Filed Weights   07/24/12 1930 07/25/12 0500  Weight: 130.953 kg (288 lb 11.2 oz) 127.098 kg (280 lb 3.2 oz)    Exam: Alert afebrile comfortable at rest.  Symmetrical Chest wall movement, Good air movement bilaterally, basilar rales   RRR, No Gallops, Rubs or Murmurs, No Parasternal Heave.   Positive Bowel Sounds, Abdomen Soft, Non tender, No organomegaly appriciated,No rebound -guarding or rigidity.   No Cyanosis, Normal Skin Turgor, No Skin Rash or Bruise. 2+ leg edema   Good muscle tone, joints appear normal , no effusions, Normal ROM.  Marland Kitchen No Palpable Lymph Nodes in Neck or Axillae   Data Reviewed: Basic Metabolic Panel:  Recent Labs Lab 07/24/12 1520 07/25/12 0215  NA 135 136  K 3.5 3.1*  CL 100 99  CO2 22 26  GLUCOSE 108* 137*  BUN 24* 26*  CREATININE 2.26* 2.20*  CALCIUM 8.7 8.5  MG  --  1.9   Liver Function Tests: No results found for this basename: AST, ALT, ALKPHOS, BILITOT, PROT, ALBUMIN,  in the last 168 hours No results found for this basename: LIPASE, AMYLASE,  in the last 168 hours No results found for this basename: AMMONIA,  in the last 168 hours CBC:  Recent Labs Lab 07/24/12 1520 07/25/12 0215  WBC 7.8 8.3  HGB 11.0* 11.1*  HCT 35.0* 33.9*  MCV 75.6* 75.3*  PLT 240 229   Cardiac Enzymes:  Recent Labs Lab 07/24/12 1521 07/24/12 2013 07/25/12 0215 07/25/12 0755  TROPONINI <0.30 <0.30 <0.30 <0.30   BNP (last 3 results)  Recent Labs  03/09/12 1620 03/13/12 0500 07/24/12 1520  PROBNP 1384.0* 541.9* 6505.0*   CBG: No results found for this basename: GLUCAP,  in the last 168 hours  No results found for this or any previous visit (from the past 240 hour(s)).   Studies: Dg Chest Port 1 View  07/24/2012  *RADIOLOGY REPORT*  Clinical Data: Shortness of  breath.  PORTABLE CHEST - 1 VIEW  Comparison: 03/11/2012.  Findings: Cardiomegaly.  Pulmonary vascular congestion/mild pulmonary edema.  Poor delineation in the left lung base.  Difficult to exclude left base atelectasis or infiltrate.  No gross pneumothorax.  The patient would eventually benefit from follow-up two-view chest with cardiac leads removed.  IMPRESSION: Cardiomegaly.  Pulmonary vascular congestion/mild pulmonary edema.  Poor delineation in the left lung base.  Difficult to exclude left base atelectasis or infiltrate.   Original Report Authenticated By: Lacy Duverney, M.D.     Scheduled Meds: . amLODipine  5 mg Oral Daily  . aspirin EC  81 mg Oral Daily  . furosemide  60 mg Intravenous Q12H  . heparin  5,000 Units Subcutaneous Q8H  . hydrALAZINE  37.5 mg Oral BID  . metoprolol tartrate  100 mg Oral BID  . multivitamin with minerals  1 tablet Oral Daily  . nitroGLYCERIN  0.5 inch Topical Q6H  . potassium chloride  20 mEq Oral Daily  . potassium chloride  40 mEq Oral Once  . simvastatin  20 mg Oral q1800  . sodium chloride  3 mL Intravenous Q12H  . sodium chloride  3 mL Intravenous Q12H   Continuous Infusions:   Principal Problem:   ARF (acute renal failure) Active Problems:   OBESITY   HYPERTENSION   SOB (shortness of breath)   Acute on chronic combined systolic and diastolic congestive heart failure   CKD (chronic kidney disease), stage III   NICM (nonischemic cardiomyopathy), now with EF 30-35%   S/P cardiac catheterization, 01/21/11 with normal coronary arteries   Hypokalemia        Shariah Assad  [facility] Pager [phone] If 7PM-7AM, please contact night-coverage at [email or web address], password [email or web address] 07/25/2012, 7:10 PM  LOS: 1 day

## 2012-07-26 ENCOUNTER — Inpatient Hospital Stay (HOSPITAL_COMMUNITY): Payer: MEDICAID

## 2012-07-26 ENCOUNTER — Encounter (HOSPITAL_COMMUNITY): Payer: Self-pay | Admitting: Cardiology

## 2012-07-26 DIAGNOSIS — E876 Hypokalemia: Secondary | ICD-10-CM

## 2012-07-26 LAB — BASIC METABOLIC PANEL
CO2: 30 mEq/L (ref 19–32)
Chloride: 95 mEq/L — ABNORMAL LOW (ref 96–112)
Creatinine, Ser: 1.98 mg/dL — ABNORMAL HIGH (ref 0.50–1.35)
GFR calc Af Amer: 45 mL/min — ABNORMAL LOW (ref >90)
Potassium: 2.7 mEq/L — CL (ref 3.5–5.1)

## 2012-07-26 LAB — URINE CULTURE
Culture: NO GROWTH
Special Requests: NORMAL

## 2012-07-26 LAB — STREP PNEUMONIAE URINARY ANTIGEN: Strep Pneumo Urinary Antigen: NEGATIVE

## 2012-07-26 LAB — MAGNESIUM: Magnesium: 1.8 mg/dL (ref 1.5–2.5)

## 2012-07-26 LAB — PRO B NATRIURETIC PEPTIDE: Pro B Natriuretic peptide (BNP): 3467 pg/mL — ABNORMAL HIGH (ref 0–125)

## 2012-07-26 MED ORDER — POTASSIUM CHLORIDE 20 MEQ/15ML (10%) PO LIQD
40.0000 meq | Freq: Once | ORAL | Status: AC
  Administered 2012-07-26: 40 meq via ORAL
  Filled 2012-07-26: qty 30

## 2012-07-26 MED ORDER — SPIRONOLACTONE 12.5 MG HALF TABLET
12.5000 mg | ORAL_TABLET | Freq: Every day | ORAL | Status: DC
  Administered 2012-07-26: 12.5 mg via ORAL
  Filled 2012-07-26 (×2): qty 1

## 2012-07-26 MED ORDER — POTASSIUM CHLORIDE CRYS ER 20 MEQ PO TBCR
20.0000 meq | EXTENDED_RELEASE_TABLET | Freq: Once | ORAL | Status: AC
  Administered 2012-07-26: 20 meq via ORAL
  Filled 2012-07-26: qty 1

## 2012-07-26 MED ORDER — LEVOFLOXACIN IN D5W 750 MG/150ML IV SOLN
750.0000 mg | INTRAVENOUS | Status: DC
  Administered 2012-07-26 – 2012-07-28 (×3): 750 mg via INTRAVENOUS
  Filled 2012-07-26 (×4): qty 150

## 2012-07-26 MED ORDER — POTASSIUM CHLORIDE 20 MEQ/15ML (10%) PO LIQD
40.0000 meq | Freq: Every day | ORAL | Status: DC
  Administered 2012-07-26: 40 meq via ORAL
  Filled 2012-07-26 (×2): qty 30

## 2012-07-26 NOTE — Progress Notes (Signed)
[facility] PROGRESS NOTE  Devon Ortiz ZOX:[id number] DOB: January 11, 1966 DOA: 07/24/2012 PCP: Dorrene German, MD  Assessment/Plan:   1. Shortness of breath due to acute on chronic combined systolic and diastolic heart failure. Last echo shows grade 3 diastolic dysfunction and EF of around 35%, patient will be admitted to telemetry bed, sodium & fluid restriction, nitro paste, IV Lasix, beta blocker, will not be able to give ACE/ARB due to acute renal insufficiency. Improvement in the pro bnp.  I/O last 3 completed shifts: In: 2823 [P.O.:2760; I.V.:63] Out: 5651 [Urine:5650; Stool:1] Total I/O In: 240 [P.O.:240] Out: 800 [Urine:800]   Cardiology consult appreciated.   2. Acute renal failure on chronic kidney disease stage IV. Baseline creatinine appears to be around 1.75, at this time clear evidence of massive fluid overload especially in the lower extremities, IV Lasix, obtain urine electrolytes, repeat BMP in the morning showed improvement in creatinine. Avoid ACE/ARB, NSAIDs, nephrotoxins.   3. Hypertension better controlled today. , home medications will be continued, will add when necessary IV Lopressor and Catapres as needed. With diuresis and shortness of breath being addressed blood pressure should improve.   4. Mild gastroenteritis induced diarrheal illness.stool workup revealed noro virus.improving.   5. Hypokalemia; replete as needed. Magnesium level within normal limits.   6. Fever: CXR finding of pneumonia, started him on levaquin. Urine for streptococcal and legionella antigen are pending.   DVT Prophylaxis Heparin   Family Communication: none at bedside.    Code Status Full     Consultants:  cardiology  HPI/Subjective: Sob improved. And in good spirits.    Objective: Filed Vitals:   07/26/12 0500 07/26/12 0525 07/26/12 1030 07/26/12 1353  BP:  129/88 138/92 135/87  Pulse:  91  86  Temp:  98.1 F (36.7 C)  97.8 F (36.6 C)  TempSrc:  Oral  Oral   Resp:  20  20  Height:      Weight: 127.37 kg (280 lb 12.8 oz)     SpO2:  100%  100%    Intake/Output Summary (Last 24 hours) at 07/26/12 1520 Last data filed at 07/26/12 1210  Gross per 24 hour  Intake    783 ml  Output   2201 ml  Net  -1418 ml   Filed Weights   07/24/12 1930 07/25/12 0500 07/26/12 0500  Weight: 130.953 kg (288 lb 11.2 oz) 127.098 kg (280 lb 3.2 oz) 127.37 kg (280 lb 12.8 oz)    Exam: Alert afebrile comfortable at rest.  Symmetrical Chest wall movement, Good air movement bilaterally, basilar rales   RRR, No Gallops, Rubs or Murmurs, No Parasternal Heave.   Positive Bowel Sounds, Abdomen Soft, Non tender, No organomegaly appriciated,No rebound -guarding or rigidity.   No Cyanosis, Normal Skin Turgor, No Skin Rash or Bruise. 2+ leg edema   Good muscle tone, joints appear normal , no effusions, Normal ROM.  Marland Kitchen No Palpable Lymph Nodes in Neck or Axillae   Data Reviewed: Basic Metabolic Panel:  Recent Labs Lab 07/24/12 1520 07/25/12 0215 07/26/12 0512  NA 135 136 133*  K 3.5 3.1* 2.7*  CL 100 99 95*  CO2 22 26 30   GLUCOSE 108* 137* 117*  BUN 24* 26* 25*  CREATININE 2.26* 2.20* 1.98*  CALCIUM 8.7 8.5 8.4  MG  --  1.9 1.8   Liver Function Tests: No results found for this basename: AST, ALT, ALKPHOS, BILITOT, PROT, ALBUMIN,  in the last 168 hours No results found for this basename: LIPASE, AMYLASE,  in the last 168 hours No results found for this basename: AMMONIA,  in the last 168 hours CBC:  Recent Labs Lab 07/24/12 1520 07/25/12 0215  WBC 7.8 8.3  HGB 11.0* 11.1*  HCT 35.0* 33.9*  MCV 75.6* 75.3*  PLT 240 229   Cardiac Enzymes:  Recent Labs Lab 07/24/12 1521 07/24/12 2013 07/25/12 0215 07/25/12 0755  TROPONINI <0.30 <0.30 <0.30 <0.30   BNP (last 3 results)  Recent Labs  03/13/12 0500 07/24/12 1520 07/26/12 0513  PROBNP 541.9* 6505.0* 3467.0*   CBG: No results found for this basename: GLUCAP,  in the last 168  hours  Recent Results (from the past 240 hour(s))  URINE CULTURE     Status: None   Collection Time    07/24/12 10:45 PM      Result Value Range Status   Specimen Description URINE, RANDOM   Final   Special Requests Normal   Final   Culture  Setup Time 07/25/2012 08:53   Final   Colony Count NO GROWTH   Final   Culture NO GROWTH   Final   Report Status 07/26/2012 FINAL   Final     Studies: Dg Chest 2 View  07/26/2012  *RADIOLOGY REPORT*  Clinical Data: Fever and weakness.  CHEST - 2 VIEW  Comparison: Chest x-ray 07/24/2012.  Findings: Retrocardiac opacity obscuring the left hemidiaphragm, concerning for pneumonia.  Small left pleural effusion.  Right lung appears clear.  No pulmonary edema.  Heart size is mildly enlarged (unchanged).  Upper mediastinal contours are unremarkable.  IMPRESSION: 1.  Left lower lobe pneumonia with probable small left parapneumonic pleural effusion.  Follow-up radiographs after appropriate trial of antimicrobial therapy is recommended to exclude a centrally obstructing lesion. 2.  Mild cardiomegaly.   Original Report Authenticated By: Trudie Reed, M.D.     Scheduled Meds: . amLODipine  5 mg Oral Daily  . aspirin EC  81 mg Oral Daily  . furosemide  60 mg Intravenous Q12H  . heparin  5,000 Units Subcutaneous Q8H  . hydrALAZINE  37.5 mg Oral BID  . metoprolol tartrate  100 mg Oral BID  . multivitamin with minerals  1 tablet Oral Daily  . nitroGLYCERIN  0.5 inch Topical Q6H  . potassium chloride  40 mEq Oral Daily  . potassium chloride  20 mEq Oral Daily  . potassium chloride  40 mEq Oral Once  . simvastatin  20 mg Oral q1800  . sodium chloride  3 mL Intravenous Q12H  . sodium chloride  3 mL Intravenous Q12H  . spironolactone  12.5 mg Oral Daily   Continuous Infusions:   Principal Problem:   Acute on chronic combined systolic and diastolic congestive heart failure Active Problems:   OBESITY   HYPERTENSION   Obstructive sleep apnea   SOB  (shortness of breath)   CKD (chronic kidney disease), stage III   Acute on chronic renal insufficiency   NICM, EF 30-35% by 2D   07/25/12   S/P cardiac catheterization, 01/21/11 with normal coronary arteries   Hypokalemia   Viral diarrhea        Lucia Harm  [facility] Pager [phone] If 7PM-7AM, please contact night-coverage at [email or web address], password [facility] 07/26/2012, 3:20 PM  LOS: 2 days

## 2012-07-26 NOTE — Progress Notes (Signed)
Pt says he will use CPAP machine later when he is ready to go to bed. PT says he's been wearing a CPAP since 2010 and he knows how to manipulate the machine. PT was advised to call if he needed any futher assistance. RT will assist as needed.

## 2012-07-26 NOTE — Progress Notes (Signed)
Pt. Seen and examined. Agree with the NP/PA-C note as written. Agree with additional diuresis and the additional of aldactone. Hopefully this will help with hypokalemia as well. Creatinine is improving with diuresis. He is net negative over -3L.  Chrystie Nose, MD, [facility] Attending Cardiologist [facility]

## 2012-07-26 NOTE — Progress Notes (Signed)
Pharmacist Heart Failure Core Measure Documentation  Assessment: Devon Ortiz has an EF documented as 30-35% on 07/25/12 by ECHO.  Rationale: Heart failure patients with left ventricular systolic dysfunction (LVSD) and an EF < 40% should be prescribed an angiotensin converting enzyme inhibitor (ACEI) or angiotensin receptor blocker (ARB) at discharge unless a contraindication is documented in the medical record.  This patient is not currently on an ACEI or ARB for HF.  This note is being placed in the record in order to provide documentation that a contraindication to the use of these agents is present for this encounter.  ACE Inhibitor or Angiotensin Receptor Blocker is contraindicated (specify all that apply)  []   ACEI allergy AND ARB allergy []   Angioedema []   Moderate or severe aortic stenosis []   Hyperkalemia []   Hypotension []   Renal artery stenosis [x]   Worsening renal function, preexisting renal disease or dysfunction   Dannielle Huh 07/26/2012 11:44 AM

## 2012-07-26 NOTE — Progress Notes (Signed)
Subjective:  Still SOB.  Objective:  Vital Signs in the last 24 hours: Temp:  [98.1 F (36.7 C)-100.6 F (38.1 C)] 98.1 F (36.7 C) (04/03 0525) Pulse Rate:  [91-104] 91 (04/03 0525) Resp:  [20-22] 20 (04/03 0525) BP: (129-154)/(88-116) 129/88 mmHg (04/03 0525) SpO2:  [97 %-100 %] 100 % (04/03 0525) Weight:  [127.37 kg (280 lb 12.8 oz)] 127.37 kg (280 lb 12.8 oz) (04/03 0500)  Intake/Output from previous day:  Intake/Output Summary (Last 24 hours) at 07/26/12 0817 Last data filed at 07/26/12 0600  Gross per 24 hour  Intake    783 ml  Output   1901 ml  Net  -1118 ml    Physical Exam: General appearance: alert, cooperative, mild SOB at rest, moderately obese and Extrem: 1+ bilat edema Lungs: decreased breath sounds at bases Heart: regular rate and rhythm    Rate: 92  Rhythm: normal sinus rhythm and occasional PVC  Lab Results:  Recent Labs  07/24/12 1520 07/25/12 0215  WBC 7.8 8.3  HGB 11.0* 11.1*  PLT 240 229    Recent Labs  07/25/12 0215 07/26/12 0512  NA 136 133*  K 3.1* 2.7*  CL 99 95*  CO2 26 30  GLUCOSE 137* 117*  BUN 26* 25*  CREATININE 2.20* 1.98*    Recent Labs  07/25/12 0215 07/25/12 0755  TROPONINI <0.30 <0.30   Hepatic Function Panel No results found for this basename: PROT, ALBUMIN, AST, ALT, ALKPHOS, BILITOT, BILIDIR, IBILI,  in the last 72 hours No results found for this basename: CHOL,  in the last 72 hours  Recent Labs  07/24/12 2013  INR 1.39    Imaging: Imaging results have been reviewed  Cardiac Studies:  Assessment/Plan:   Principal Problem:   Acute on chronic combined systolic and diastolic  congestive heart failure  Active Problems:   Viral GI illness prior to admission, unable to keep meds down   SOB (shortness of breath)   Acute on chronic renal insufficency   HYPERTENSION   CKD (chronic kidney disease), stage III   NICM, EF 30-35% by 2D   07/25/12   Hypokalemia   OBESITY   Obstructive sleep apnea- on  C-pap   S/P cardiac catheterization, 01/21/11 with normal coronary arteries  Plan- Admitted with CHF exacerbation after a viral GI illness, unable to take his medications. Continue to diurese, he says his usual wgt is 260, (he is 280 now)  Consider re checking LVF prior to discharge once he is diuresed, otherwise we will need to consider a LifeVest.  K+ ordered, will add Aldactone as hypokalemia seems to be a recurrent issue with him.     Corine Shelter PA-C 07/26/2012, 8:17 AM

## 2012-07-26 NOTE — Progress Notes (Signed)
ANTIBIOTIC CONSULT NOTE - FOLLOW UP  Pharmacy Consult for Levaquin Indication: Community-acquired pneumonia  No Known Allergies  Patient Measurements: Height: 5\' 8"  (172.7 cm) Weight: 280 lb 12.8 oz (127.37 kg) IBW/kg (Calculated) : 68.4   Vital Signs: Temp: 97.8 F (36.6 C) (04/03 1353) Temp src: Oral (04/03 1353) BP: 135/87 mmHg (04/03 1353) Pulse Rate: 86 (04/03 1353) Intake/Output from previous day: 04/02 0701 - 04/03 0700 In: 1143 [P.O.:1080; I.V.:63] Out: 1901 [Urine:1900; Stool:1] Intake/Output from this shift: Total I/O In: 240 [P.O.:240] Out: 800 [Urine:800]  Labs:  Recent Labs  07/24/12 1520 07/25/12 0215 07/26/12 0512  WBC 7.8 8.3  --   HGB 11.0* 11.1*  --   PLT 240 229  --   CREATININE 2.26* 2.20* 1.98*   Estimated Creatinine Clearance: 61.3 ml/min (by C-G formula based on Cr of 1.98). No results found for this basename: VANCOTROUGH, Leodis Binet, VANCORANDOM, GENTTROUGH, GENTPEAK, GENTRANDOM, TOBRATROUGH, TOBRAPEAK, TOBRARND, AMIKACINPEAK, AMIKACINTROU, AMIKACIN,  in the last 72 hours   Microbiology: Recent Results (from the past 720 hour(s))  URINE CULTURE     Status: None   Collection Time    07/24/12 10:45 PM      Result Value Range Status   Specimen Description URINE, RANDOM   Final   Special Requests Normal   Final   Culture  Setup Time 07/25/2012 08:53   Final   Colony Count NO GROWTH   Final   Culture NO GROWTH   Final   Report Status 07/26/2012 FINAL   Final    Anti-infectives   None      Assessment: 46 yom admitted 4/1 with viral GI illness, SOB 2/2 CHF exacerbation.  CXR 4/1 difficult to exclude infiltrate and CXR 4/3 positive for LLL pnuemonia, MD ordered to start Levaquin per pharmacy. Patient with h/o CKD stage IV (baseline Scr ~1.75).  Tmax 100.6, WBC wnl, Scr 1.98 for CG 61 ml/min  Pending strep pneumo and legionella urinary ag.  Urine culture on 4/1 negative.   Plan:   Levaquin 750 mg IV q24h for now.  Pharmacy will f/u  and change to PO route when appropriate.  Geoffry Paradise, PharmD, BCPS Pager: [phone] 3:37 PM Pharmacy #: [phone]

## 2012-07-27 LAB — BASIC METABOLIC PANEL
BUN: 24 mg/dL — ABNORMAL HIGH (ref 6–23)
CO2: 30 mEq/L (ref 19–32)
Calcium: 8.4 mg/dL (ref 8.4–10.5)
Chloride: 96 mEq/L (ref 96–112)
Creatinine, Ser: 1.86 mg/dL — ABNORMAL HIGH (ref 0.50–1.35)
Glucose, Bld: 103 mg/dL — ABNORMAL HIGH (ref 70–99)

## 2012-07-27 LAB — LEGIONELLA ANTIGEN, URINE

## 2012-07-27 MED ORDER — POTASSIUM CHLORIDE 20 MEQ/15ML (10%) PO LIQD
60.0000 meq | Freq: Every day | ORAL | Status: DC
  Administered 2012-07-27 – 2012-07-31 (×5): 60 meq via ORAL
  Filled 2012-07-27 (×6): qty 45

## 2012-07-27 MED ORDER — SPIRONOLACTONE 25 MG PO TABS
25.0000 mg | ORAL_TABLET | Freq: Every day | ORAL | Status: DC
  Administered 2012-07-27 – 2012-07-31 (×5): 25 mg via ORAL
  Filled 2012-07-27 (×5): qty 1

## 2012-07-27 NOTE — Progress Notes (Signed)
[facility] PROGRESS NOTE  Thad Osoria UJW:[id number] DOB: [birth year] DOA: 07/24/2012 PCP: Dorrene German, MD  Assessment/Plan:   1. Shortness of breath due to acute on chronic combined systolic and diastolic heart failure. Last echo shows grade 3 diastolic dysfunction and EF of around 35%, patient will be admitted to telemetry bed, sodium & fluid restriction, nitro paste, IV Lasix, beta blocker, will not be able to give ACE/ARB due to acute renal insufficiency. Improvement in the pro bnp.  I/O last 3 completed shifts: In: 1513 [P.O.:1300; I.V.:63; IV Piggyback:150] Out: 2876 [Urine:2875; Stool:1]     Cardiology consult appreciated.   2. Acute renal failure on chronic kidney disease stage IV. Baseline creatinine appears to be around 1.75, at this time clear evidence of massive fluid overload especially in the lower extremities, IV Lasix, obtain urine electrolytes, repeat BMP in the morning showed improvement in creatinine. Avoid ACE/ARB, NSAIDs, nephrotoxins.   3. Hypertension better controlled today. , home medications will be continued, will add when necessary IV Lopressor and Catapres as needed. With diuresis and shortness of breath being addressed blood pressure should improve.   4. Mild gastroenteritis induced diarrheal illness.stool workup revealed noro virus.improving.   5. Hypokalemia; replete as needed. Magnesium level within normal limits.   6. Fever: CXR finding of pneumonia, started him on levaquin. Urine for streptococcal and legionella antigen are pending.   DVT Prophylaxis Heparin   Family Communication: none at bedside.    Code Status Full     Consultants:  cardiology  HPI/Subjective: Sob improved. And in good spirits.    Objective: Filed Vitals:   07/26/12 2335 07/27/12 0050 07/27/12 0500 07/27/12 0620  BP: 129/91 130/98  149/108  Pulse: 86 84  88  Temp: 99.2 F (37.3 C)   98 F (36.7 C)  TempSrc: Oral   Oral  Resp: 16   26  Height:       Weight:   126.6 kg (279 lb 1.6 oz)   SpO2: 97%   100%    Intake/Output Summary (Last 24 hours) at 07/27/12 0852 Last data filed at 07/27/12 0620  Gross per 24 hour  Intake    850 ml  Output   1775 ml  Net   -925 ml   Filed Weights   07/25/12 0500 07/26/12 0500 07/27/12 0500  Weight: 127.098 kg (280 lb 3.2 oz) 127.37 kg (280 lb 12.8 oz) 126.6 kg (279 lb 1.6 oz)    Exam: Alert afebrile comfortable at rest.  Symmetrical Chest wall movement, Good air movement bilaterally, basilar rales   RRR, No Gallops, Rubs or Murmurs, No Parasternal Heave.   Positive Bowel Sounds, Abdomen Soft, Non tender, No organomegaly appriciated,No rebound -guarding or rigidity.   No Cyanosis, Normal Skin Turgor, No Skin Rash or Bruise. 2+ leg edema   Good muscle tone, joints appear normal , no effusions, Normal ROM.  Marland Kitchen No Palpable Lymph Nodes in Neck or Axillae   Data Reviewed: Basic Metabolic Panel:  Recent Labs Lab 07/24/12 1520 07/25/12 0215 07/26/12 0512 07/27/12 0435  NA 135 136 133* 135  K 3.5 3.1* 2.7* 3.1*  CL 100 99 95* 96  CO2 22 26 30 30   GLUCOSE 108* 137* 117* 103*  BUN 24* 26* 25* 24*  CREATININE 2.26* 2.20* 1.98* 1.86*  CALCIUM 8.7 8.5 8.4 8.4  MG  --  1.9 1.8  --    Liver Function Tests: No results found for this basename: AST, ALT, ALKPHOS, BILITOT, PROT, ALBUMIN,  in the last 168  hours No results found for this basename: LIPASE, AMYLASE,  in the last 168 hours No results found for this basename: AMMONIA,  in the last 168 hours CBC:  Recent Labs Lab 07/24/12 1520 07/25/12 0215  WBC 7.8 8.3  HGB 11.0* 11.1*  HCT 35.0* 33.9*  MCV 75.6* 75.3*  PLT 240 229   Cardiac Enzymes:  Recent Labs Lab 07/24/12 1521 07/24/12 2013 07/25/12 0215 07/25/12 0755  TROPONINI <0.30 <0.30 <0.30 <0.30   BNP (last 3 results)  Recent Labs  03/13/12 0500 07/24/12 1520 07/26/12 0513  PROBNP 541.9* 6505.0* 3467.0*   CBG: No results found for this basename: GLUCAP,  in the last  168 hours  Recent Results (from the past 240 hour(s))  URINE CULTURE     Status: None   Collection Time    07/24/12 10:45 PM      Result Value Range Status   Specimen Description URINE, RANDOM   Final   Special Requests Normal   Final   Culture  Setup Time 07/25/2012 08:53   Final   Colony Count NO GROWTH   Final   Culture NO GROWTH   Final   Report Status 07/26/2012 FINAL   Final     Studies: Dg Chest 2 View  07/26/2012  *RADIOLOGY REPORT*  Clinical Data: Fever and weakness.  CHEST - 2 VIEW  Comparison: Chest x-ray 07/24/2012.  Findings: Retrocardiac opacity obscuring the left hemidiaphragm, concerning for pneumonia.  Small left pleural effusion.  Right lung appears clear.  No pulmonary edema.  Heart size is mildly enlarged (unchanged).  Upper mediastinal contours are unremarkable.  IMPRESSION: 1.  Left lower lobe pneumonia with probable small left parapneumonic pleural effusion.  Follow-up radiographs after appropriate trial of antimicrobial therapy is recommended to exclude a centrally obstructing lesion. 2.  Mild cardiomegaly.   Original Report Authenticated By: Trudie Reed, M.D.     Scheduled Meds: . amLODipine  5 mg Oral Daily  . aspirin EC  81 mg Oral Daily  . furosemide  60 mg Intravenous Q12H  . heparin  5,000 Units Subcutaneous Q8H  . hydrALAZINE  37.5 mg Oral BID  . levofloxacin (LEVAQUIN) IV  750 mg Intravenous Q24H  . metoprolol tartrate  100 mg Oral BID  . multivitamin with minerals  1 tablet Oral Daily  . nitroGLYCERIN  0.5 inch Topical Q6H  . potassium chloride  60 mEq Oral Daily  . potassium chloride  40 mEq Oral Once  . simvastatin  20 mg Oral q1800  . sodium chloride  3 mL Intravenous Q12H  . sodium chloride  3 mL Intravenous Q12H  . spironolactone  25 mg Oral Daily   Continuous Infusions:   Principal Problem:   Acute on chronic combined systolic and diastolic congestive heart failure Active Problems:   OBESITY   HYPERTENSION   Obstructive sleep  apnea   SOB (shortness of breath)   CKD (chronic kidney disease), stage III   Acute on chronic renal insufficiency   NICM, EF 30-35% by 2D   07/25/12   S/P cardiac catheterization, 01/21/11 with normal coronary arteries   Hypokalemia   Viral diarrhea        Magda Muise  [facility] Pager [phone] If 7PM-7AM, please contact night-coverage at [email or web address], password [facility] 07/27/2012, 8:52 AM  LOS: 3 days

## 2012-07-27 NOTE — Progress Notes (Signed)
[facility]  Subjective: Breathing better  Objective: Vital signs in last 24 hours: Temp:  [97.8 F (36.6 C)-99.2 F (37.3 C)] 98 F (36.7 C) (04/04 0620) Pulse Rate:  [84-92] 88 (04/04 0620) Resp:  [16-26] 26 (04/04 0620) BP: (129-149)/(87-108) 149/108 mmHg (04/04 0620) SpO2:  [97 %-100 %] 100 % (04/04 0620) Weight:  [126.6 kg (279 lb 1.6 oz)] 126.6 kg (279 lb 1.6 oz) (04/04 0500) Last BM Date: 07/25/12  Intake/Output from previous day: 04/03 0701 - 04/04 0700 In: 970 [P.O.:820; IV Piggyback:150] Out: 1775 [Urine:1775] Intake/Output this shift:    Medications Current Facility-Administered Medications  Medication Dose Route Frequency Provider Last Rate Last Dose  . 0.9 %  sodium chloride infusion  250 mL Intravenous PRN Leroy Sea, MD      . albuterol (PROVENTIL) (5 MG/ML) 0.5% nebulizer solution 2.5 mg  2.5 mg Nebulization Q2H PRN Leroy Sea, MD      . amLODipine (NORVASC) tablet 5 mg  5 mg Oral Daily Leroy Sea, MD   5 mg at 07/26/12 1034  . aspirin EC tablet 81 mg  81 mg Oral Daily Leroy Sea, MD   81 mg at 07/26/12 1033  . cloNIDine (CATAPRES) tablet 0.1 mg  0.1 mg Oral Q6H PRN Leroy Sea, MD   0.1 mg at 07/24/12 2234  . furosemide (LASIX) injection 60 mg  60 mg Intravenous Q12H Leroy Sea, MD   60 mg at 07/26/12 2008  . guaiFENesin-dextromethorphan (ROBITUSSIN DM) 100-10 MG/5ML syrup 5 mL  5 mL Oral Q4H PRN Leroy Sea, MD   5 mL at 07/25/12 2116  . heparin injection 5,000 Units  5,000 Units Subcutaneous Q8H Leroy Sea, MD   5,000 Units at 07/27/12 9604  . hydrALAZINE (APRESOLINE) tablet 37.5 mg  37.5 mg Oral BID Leroy Sea, MD   37.5 mg at 07/26/12 2117  . HYDROcodone-acetaminophen (NORCO/VICODIN) 5-325 MG per tablet 1-2 tablet  1-2 tablet Oral Q4H PRN Leroy Sea, MD   2 tablet at 07/25/12 0325  . levofloxacin (LEVAQUIN) IVPB 750 mg  750 mg Intravenous Q24H Thuyvan Thi Phan, RPH    750 mg at 07/26/12 1734  . metoprolol (LOPRESSOR) injection 5 mg  5 mg Intravenous Q4H PRN Leroy Sea, MD   5 mg at 07/24/12 1944  . metoprolol tartrate (LOPRESSOR) tablet 100 mg  100 mg Oral BID Leroy Sea, MD   100 mg at 07/26/12 2118  . multivitamin with minerals tablet 1 tablet  1 tablet Oral Daily Leroy Sea, MD   1 tablet at 07/26/12 1032  . nitroGLYCERIN (NITROGLYN) 2 % ointment 0.5 inch  0.5 inch Topical Q6H Leroy Sea, MD   0.5 inch at 07/27/12 0626  . ondansetron (ZOFRAN) tablet 4 mg  4 mg Oral Q6H PRN Leroy Sea, MD       Or  . ondansetron (ZOFRAN) injection 4 mg  4 mg Intravenous Q6H PRN Leroy Sea, MD      . polyethylene glycol (MIRALAX / GLYCOLAX) packet 17 g  17 g Oral Daily PRN Leroy Sea, MD      . potassium chloride 20 MEQ/15ML (10%) liquid 40 mEq  40 mEq Oral Daily Roma Kayser Schorr, NP   40 mEq at 07/26/12 0824  . potassium chloride SA (K-DUR,KLOR-CON) CR tablet 20 mEq  20 mEq Oral Daily Leroy Sea, MD   20 mEq at 07/26/12 1033  .  potassium chloride SA (K-DUR,KLOR-CON) CR tablet 40 mEq  40 mEq Oral Once Leroy Sea, MD      . simvastatin (ZOCOR) tablet 20 mg  20 mg Oral q1800 Leroy Sea, MD   20 mg at 07/26/12 1734  . sodium chloride 0.9 % injection 3 mL  3 mL Intravenous Q12H Leroy Sea, MD   3 mL at 07/26/12 1000  . sodium chloride 0.9 % injection 3 mL  3 mL Intravenous PRN Leroy Sea, MD      . sodium chloride 0.9 % injection 3 mL  3 mL Intravenous Q12H Leroy Sea, MD   3 mL at 07/25/12 2116  . spironolactone (ALDACTONE) tablet 12.5 mg  12.5 mg Oral Daily Luke K Kilroy, PA-C   12.5 mg at 07/26/12 1032    PE: General appearance: alert, cooperative, no distress and Sitting up in the chair Lungs: Bilateral basilar rales Heart: regular rate and rhythm, S1, S2 normal, no murmur, click, rub or gallop Extremities: 2+ LEE Pulses: 2+ and symmetric Neurologic: Grossly normal  Lab Results:   Recent  Labs  07/24/12 1520 07/25/12 0215  WBC 7.8 8.3  HGB 11.0* 11.1*  HCT 35.0* 33.9*  PLT 240 229   BMET  Recent Labs  07/25/12 0215 07/26/12 0512 07/27/12 0435  NA 136 133* 135  K 3.1* 2.7* 3.1*  CL 99 95* 96  CO2 26 30 30   GLUCOSE 137* 117* 103*  BUN 26* 25* 24*  CREATININE 2.20* 1.98* 1.86*  CALCIUM 8.5 8.4 8.4   PT/INR  Recent Labs  07/24/12 2013  LABPROT 16.7*  INR 1.39   Study Conclusions  - Left ventricle: The cavity size was moderately dilated. There was moderate concentric hypertrophy. Systolic function was moderately to severely reduced. The estimated ejection fraction was in the range of 30% to 35%. Diffuse hypokinesis. Features are consistent with a pseudonormal left ventricular filling pattern, with concomitant abnormal relaxation and increased filling pressure (grade 2 diastolic dysfunction). - Aortic valve: Trileaflet; mildly thickened leaflets. - Mitral valve: Mildly thickened leaflets . Moderate regurgitation. - Left atrium: The atrium was mildly to moderately dilated. - Right ventricle: Systolic pressure was increased. - Right atrium: The atrium was mildly dilated. - Tricuspid valve: Moderate regurgitation. - Pulmonary arteries: PA peak pressure: 58mm Hg (S). Impressions:  - The right ventricular systolic pressure was increased consistent with moderate pulmonary hypertension.   Assessment/Plan   Principal Problem:   Acute on chronic combined systolic and diastolic congestive heart failure Active Problems:   OBESITY   HYPERTENSION   Obstructive sleep apnea   SOB (shortness of breath)   CKD (chronic kidney disease), stage III   Acute on chronic renal insufficiency   NICM, EF 30-35% by 2D   07/25/12   S/P cardiac catheterization, 01/21/11 with normal coronary arteries   Hypokalemia   Viral diarrhea  Plan:  Mild improvement in K+, additional already ordered this morning.  SCr mildly improved.  Net fluids: -0.8L in last 24 hrs.  Continue  current lasix dosing.  Aldactone added yesterday.  Will increase to 25mg   He is having short NSVT.  Lopressor at 100mg  BID.  Lifevest is recommended.    LOS: 3 days    Ortiz, Devon 07/27/2012 7:37 AM  I have seen and examined the patient along with Ortiz, BRYAN, PA.  I have reviewed the chart, notes and new data.  I agree with PA's note.  Key new complaints: breathing well even when fully supine; 3  watery stools today Key examination changes: no S3, no rales, cannot evaluate JVP, no edema (compression stockings on) Key new findings / data: K 3.1 (better, still low), creat better (1.86, not far from baseline); BNP better, but still way above baseline yesterday; rare, brief NSVT, echos reviewed  PLAN: Review of his echos over last few years shows direct correlation between his compliance with meds and the LV EF. I suspect that if he takes his meds appropriately we will see a marked (although likely not full) recovery of LVEF. In the interim, LifeVest is appropriate. Target BP 120s/70s. Target BNP around 500. Recheck echo in 3 months. Suspect resolution of (Norovirus-related) diarrhea will allow easier management of hypokalemia.  Devon Fair, MD, [facility] [phone] 07/27/2012, 4:17 PM

## 2012-07-27 NOTE — Progress Notes (Signed)
PT states he can put CPAP machine on himself when he is ready to use it. Pt has been advised to call RT if help is needed. RT will assist as needed.

## 2012-07-27 NOTE — Progress Notes (Signed)
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 Patient goes to [facility] to get his prescriptions filled and states that he does not have any problems with that (pt is a bit sad about his mother providing the financial support during this time). CM asked patient would be be able to get his prescriptions filled at discharge, patient stated yes. Lots of emotional support given.

## 2012-07-28 DIAGNOSIS — I428 Other cardiomyopathies: Secondary | ICD-10-CM

## 2012-07-28 MED ORDER — ZOLPIDEM TARTRATE 5 MG PO TABS
5.0000 mg | ORAL_TABLET | Freq: Once | ORAL | Status: AC
  Administered 2012-07-28: 5 mg via ORAL
  Filled 2012-07-28: qty 1

## 2012-07-28 MED ORDER — ISOSORBIDE DINITRATE 10 MG PO TABS
10.0000 mg | ORAL_TABLET | Freq: Three times a day (TID) | ORAL | Status: DC
  Administered 2012-07-28 – 2012-07-31 (×9): 10 mg via ORAL
  Filled 2012-07-28 (×12): qty 1

## 2012-07-28 NOTE — Progress Notes (Signed)
[facility] PROGRESS NOTE  Brainard Highfill ZOX:[id number] DOB: 04/04/1966 DOA: 07/24/2012 PCP: Dorrene German, MD  Assessment/Plan:   1. Shortness of breath due to acute on chronic combined systolic and diastolic heart failure. Last echo shows grade 3 diastolic dysfunction and EF of around 35%, patient was admitted to telemetry bed, sodium & fluid restriction, nitro paste, IV Lasix, beta blocker, will not be able to give ACE/ARB due to acute renal insufficiency. Improvement in the pro bnp.  I/O last 3 completed shifts: In: 940 [P.O.:940] Out: 3675 [Urine:3675] Total I/O In: 960 [P.O.:960] Out: 1150 [Urine:1150]   Cardiology consult appreciated. He has diuresed appropriately, possibly change his IV lasix to po lasix in am. Life vest on Monday. BMP pending.   2. Acute renal failure on chronic kidney disease stage IV. Baseline creatinine appears to be around 1.75, at this time clear evidence of massive fluid overload especially in the lower extremities, IV Lasix, obtain urine electrolytes, repeat BMP in the morning showed improvement in creatinine. Avoid ACE/ARB, NSAIDs, nephrotoxins.   3. Hypertension better controlled today. , home medications will be continued, will add when necessary IV Lopressor and Catapres as needed. With diuresis and shortness of breath being addressed blood pressure should improve.   4. Mild gastroenteritis induced diarrheal illness.stool workup revealed noro virus.improving.   5. Hypokalemia; replete as needed. Magnesium level within normal limits.   6. Fever: CXR finding of pneumonia, started him on levaquin. Urine for streptococcal and legionella antigen are pending.   DVT Prophylaxis Heparin   Family Communication: none at bedside.    Code Status Full     Consultants:  cardiology  HPI/Subjective: Sob improved. And in good spirits.    Objective: Filed Vitals:   07/27/12 2222 07/28/12 0636 07/28/12 1100 07/28/12 1500  BP: 139/83 137/105  160/113 125/84  Pulse: 90 82 92 84  Temp: 99 F (37.2 C) 98.6 F (37 C)  98.1 F (36.7 C)  TempSrc: Oral Axillary  Oral  Resp: 20 18  20   Height:      Weight:  125.6 kg (276 lb 14.4 oz)    SpO2: 97% 100%  97%    Intake/Output Summary (Last 24 hours) at 07/28/12 1700 Last data filed at 07/28/12 1538  Gross per 24 hour  Intake    960 ml  Output   3350 ml  Net  -2390 ml   Filed Weights   07/26/12 0500 07/27/12 0500 07/28/12 0636  Weight: 127.37 kg (280 lb 12.8 oz) 126.6 kg (279 lb 1.6 oz) 125.6 kg (276 lb 14.4 oz)    Exam: Alert afebrile comfortable at rest.  Symmetrical Chest wall movement, Good air movement bilaterally, basilar rales   RRR, No Gallops, Rubs or Murmurs, No Parasternal Heave.   Positive Bowel Sounds, Abdomen Soft, Non tender, No organomegaly appriciated,No rebound -guarding or rigidity.   No Cyanosis, Normal Skin Turgor, No Skin Rash or Bruise. 2+ leg edema   Good muscle tone, joints appear normal , no effusions, Normal ROM.  Marland Kitchen No Palpable Lymph Nodes in Neck or Axillae   Data Reviewed: Basic Metabolic Panel:  Recent Labs Lab 07/24/12 1520 07/25/12 0215 07/26/12 0512 07/27/12 0435  NA 135 136 133* 135  K 3.5 3.1* 2.7* 3.1*  CL 100 99 95* 96  CO2 22 26 30 30   GLUCOSE 108* 137* 117* 103*  BUN 24* 26* 25* 24*  CREATININE 2.26* 2.20* 1.98* 1.86*  CALCIUM 8.7 8.5 8.4 8.4  MG  --  1.9 1.8  --  Liver Function Tests: No results found for this basename: AST, ALT, ALKPHOS, BILITOT, PROT, ALBUMIN,  in the last 168 hours No results found for this basename: LIPASE, AMYLASE,  in the last 168 hours No results found for this basename: AMMONIA,  in the last 168 hours CBC:  Recent Labs Lab 07/24/12 1520 07/25/12 0215  WBC 7.8 8.3  HGB 11.0* 11.1*  HCT 35.0* 33.9*  MCV 75.6* 75.3*  PLT 240 229   Cardiac Enzymes:  Recent Labs Lab 07/24/12 1521 07/24/12 2013 07/25/12 0215 07/25/12 0755  TROPONINI <0.30 <0.30 <0.30 <0.30   BNP (last 3  results)  Recent Labs  03/13/12 0500 07/24/12 1520 07/26/12 0513  PROBNP 541.9* 6505.0* 3467.0*   CBG: No results found for this basename: GLUCAP,  in the last 168 hours  Recent Results (from the past 240 hour(s))  URINE CULTURE     Status: None   Collection Time    07/24/12 10:45 PM      Result Value Range Status   Specimen Description URINE, RANDOM   Final   Special Requests Normal   Final   Culture  Setup Time 07/25/2012 08:53   Final   Colony Count NO GROWTH   Final   Culture NO GROWTH   Final   Report Status 07/26/2012 FINAL   Final     Studies: No results found.  Scheduled Meds: . amLODipine  5 mg Oral Daily  . aspirin EC  81 mg Oral Daily  . furosemide  60 mg Intravenous Q12H  . heparin  5,000 Units Subcutaneous Q8H  . hydrALAZINE  37.5 mg Oral BID  . isosorbide dinitrate  10 mg Oral TID  . levofloxacin (LEVAQUIN) IV  750 mg Intravenous Q24H  . metoprolol tartrate  100 mg Oral BID  . multivitamin with minerals  1 tablet Oral Daily  . potassium chloride  60 mEq Oral Daily  . potassium chloride  40 mEq Oral Once  . simvastatin  20 mg Oral q1800  . sodium chloride  3 mL Intravenous Q12H  . sodium chloride  3 mL Intravenous Q12H  . spironolactone  25 mg Oral Daily   Continuous Infusions:   Principal Problem:   Acute on chronic combined systolic and diastolic congestive heart failure Active Problems:   OBESITY   HYPERTENSION   Obstructive sleep apnea   SOB (shortness of breath)   CKD (chronic kidney disease), stage III   Acute on chronic renal insufficiency   NICM, EF 30-35% by 2D   07/25/12   S/P cardiac catheterization, 01/21/11 with normal coronary arteries   Hypokalemia   Viral diarrhea        Ama Mcmaster  [facility] Pager [phone] If 7PM-7AM, please contact night-coverage at [email or web address], password [facility] 07/28/2012, 5:00 PM  LOS: 4 days

## 2012-07-28 NOTE — Progress Notes (Signed)
Pt seen, offered assistance with his home cpap unit.  Pt stated he has worn cpap since 2010 and doesn't need any help.  Sterile water was provided per request.  Pt was advised that RT available all night should he need further assistance.  RN notified.

## 2012-07-28 NOTE — Progress Notes (Signed)
OT Cancellation Note  Patient Details Name: Devon Ortiz MRN: [id number] DOB: 10-Jan-1966   Cancelled Treatment:    Reason Eval/Treat Not Completed:  Pt is performing ADL and mobility independently.  No OT is indicated.  Signing off.  Evern Bio 07/28/2012, 2:52 PM [phone]

## 2012-07-28 NOTE — Progress Notes (Signed)
Physical Therapy SCREEN Patient Details Name: Devon Ortiz MRN: [id number] DOB: 1965/05/18 Today's Date: 07/28/2012 Time: 1206-     Patient is up ad lib, independently. Ambulated in hall with no assistance. No PT needs at this time.          Sharen Heck PT [phone] 07/28/2012, 12:06 PM

## 2012-07-28 NOTE — Progress Notes (Signed)
Subjective:  SOB improving. He apparently had run out of his medications for a month prior to admission.  Objective:  Vital Signs in the last 24 hours: Temp:  [98.1 F (36.7 C)-99 F (37.2 C)] 98.6 F (37 C) (04/05 0636) Pulse Rate:  [82-94] 82 (04/05 0636) Resp:  [18-20] 18 (04/05 0636) BP: (125-143)/(83-105) 137/105 mmHg (04/05 0636) SpO2:  [97 %-100 %] 100 % (04/05 0636) Weight:  [125.6 kg (276 lb 14.4 oz)] 125.6 kg (276 lb 14.4 oz) (04/05 0636)  Intake/Output from previous day:  Intake/Output Summary (Last 24 hours) at 07/28/12 1051 Last data filed at 07/28/12 0900  Gross per 24 hour  Intake    480 ml  Output   3150 ml  Net  -2670 ml    Physical Exam: General appearance: alert, cooperative, no distress and morbidly obese Lungs: clear to auscultation bilaterally Heart: regular rate and rhythm   Rate: 80  Rhythm: normal sinus rhythm and PVCs  Lab Results: No results found for this basename: WBC, HGB, PLT,  in the last 72 hours  Recent Labs  07/26/12 0512 07/27/12 0435  NA 133* 135  K 2.7* 3.1*  CL 95* 96  CO2 30 30  GLUCOSE 117* 103*  BUN 25* 24*  CREATININE 1.98* 1.86*   No results found for this basename: TROPONINI, CK, MB,  in the last 72 hours Hepatic Function Panel No results found for this basename: PROT, ALBUMIN, AST, ALT, ALKPHOS, BILITOT, BILIDIR, IBILI,  in the last 72 hours No results found for this basename: CHOL,  in the last 72 hours No results found for this basename: INR,  in the last 72 hours  Imaging: Imaging results have been reviewed  Cardiac Studies:  Assessment/Plan:   Principal Problem:   Acute on chronic combined systolic and diastolic congestive heart failure Active Problems:   SOB (shortness of breath)   Acute on chronic renal insufficiency   HYPERTENSION   CKD (chronic kidney disease), stage III   NICM, EF 30-35% by 2D   07/25/12   Hypokalemia   Viral diarrhea   OBESITY   Obstructive sleep apnea   S/P cardiac  catheterization, 01/21/11 with normal coronary arteries   Plan- Continue to diurese. Goal wgt around 260, (276 today). I spoke with Morrie Sheldon at [place] and will get the paperwork in. MD to see.   Corine Shelter PA-C 07/28/2012, 10:51 AM   I have seen and examined the patient along with Corine Shelter, PA.  I have reviewed the chart, notes and new data.  I agree with PA's note.  Key new complaints: denies dyspnea (at rest), does not appear orthopneic Key examination changes: frequent ectopy, elevated JVP, possible S3 Key new findings / data: improved renal function despite good diuresis  PLAN: Improved but still hypervolemic. Continue diuresis. LifeVest (probably not ready until Monday). Reevaluate EF in 3 months.  Thurmon Fair, MD, [facility] [phone] 07/28/2012, 11:28 AM

## 2012-07-29 LAB — BASIC METABOLIC PANEL
BUN: 24 mg/dL — ABNORMAL HIGH (ref 6–23)
CO2: 30 mEq/L (ref 19–32)
Calcium: 8.5 mg/dL (ref 8.4–10.5)
Chloride: 97 mEq/L (ref 96–112)
Creatinine, Ser: 2 mg/dL — ABNORMAL HIGH (ref 0.50–1.35)
GFR calc Af Amer: 45 mL/min — ABNORMAL LOW (ref >90)
GFR calc non Af Amer: 39 mL/min — ABNORMAL LOW (ref >90)
Glucose, Bld: 107 mg/dL — ABNORMAL HIGH (ref 70–99)
Potassium: 3.4 mEq/L — ABNORMAL LOW (ref 3.5–5.1)
Sodium: 134 mEq/L — ABNORMAL LOW (ref 135–145)

## 2012-07-29 MED ORDER — LEVOFLOXACIN 750 MG PO TABS
750.0000 mg | ORAL_TABLET | ORAL | Status: DC
  Administered 2012-07-29 – 2012-07-30 (×2): 750 mg via ORAL
  Filled 2012-07-29 (×3): qty 1

## 2012-07-29 MED ORDER — ZOLPIDEM TARTRATE 5 MG PO TABS
5.0000 mg | ORAL_TABLET | Freq: Every evening | ORAL | Status: DC | PRN
  Administered 2012-07-29 – 2012-07-30 (×2): 5 mg via ORAL
  Filled 2012-07-29: qty 1

## 2012-07-29 NOTE — Progress Notes (Signed)
[facility] PROGRESS NOTE  Devon Ortiz VFI:[id number] DOB: 1965-06-24 DOA: 07/24/2012 PCP: Dorrene German, MD  Assessment/Plan:   1. Shortness of breath due to acute on chronic combined systolic and diastolic heart failure. Last echo shows grade 3 diastolic dysfunction and EF of around 35%, patient was admitted to telemetry bed, sodium & fluid restriction, nitro paste, IV Lasix, beta blocker, will not be able to give ACE/ARB due to acute renal insufficiency. Improvement in the pro bnp.  I/O last 3 completed shifts: In: 1480 [P.O.:1320; I.V.:10; IV Piggyback:150] Out: 5775 [Urine:5775]     Cardiology consult appreciated. He has diuresed appropriately, possibly change his IV lasix to po lasix in am. Life vest on Monday. BMP pending.   2. Acute renal failure on chronic kidney disease stage IV. Baseline creatinine appears to be around 1.75, at this time clear evidence of massive fluid overload especially in the lower extremities, IV Lasix, obtain urine electrolytes, repeat BMP in the morning showed improvement in creatinine. Avoid ACE/ARB, NSAIDs, nephrotoxins.   3. Hypertension better controlled today. , home medications will be continued, will add when necessary IV Lopressor and Catapres as needed. With diuresis and shortness of breath being addressed blood pressure should improve.   4. Mild gastroenteritis induced diarrheal illness.stool workup revealed noro virus.improving.   5. Hypokalemia; replete as needed. Magnesium level within normal limits.   6. Fever: CXR finding of pneumonia, started him on levaquin. Urine for streptococcal and legionella antigen are pending.   DVT Prophylaxis Heparin   Family Communication: none at bedside.    Code Status Full     Consultants:  cardiology  HPI/Subjective: Sob improved. And in good spirits.    Objective: Filed Vitals:   07/28/12 1500 07/28/12 2153 07/29/12 0637 07/29/12 1300  BP: 125/84 137/87 150/110 135/101  Pulse: 84  90 83 74  Temp: 98.1 F (36.7 C) 97.1 F (36.2 C) 97.6 F (36.4 C) 97.7 F (36.5 C)  TempSrc: Oral Oral Oral Oral  Resp: 20 18 20 18   Height:      Weight:   122.4 kg (269 lb 13.5 oz)   SpO2: 97% 98% 100% 100%    Intake/Output Summary (Last 24 hours) at 07/29/12 1936 Last data filed at 07/29/12 1808  Gross per 24 hour  Intake    370 ml  Output   4200 ml  Net  -3830 ml   Filed Weights   07/27/12 0500 07/28/12 0636 07/29/12 0637  Weight: 126.6 kg (279 lb 1.6 oz) 125.6 kg (276 lb 14.4 oz) 122.4 kg (269 lb 13.5 oz)    Exam: Alert afebrile comfortable at rest.  Symmetrical Chest wall movement, Good air movement bilaterally, basilar rales   RRR, No Gallops, Rubs or Murmurs, No Parasternal Heave.   Positive Bowel Sounds, Abdomen Soft, Non tender, No organomegaly appriciated,No rebound -guarding or rigidity.   No Cyanosis, Normal Skin Turgor, No Skin Rash or Bruise. 2+ leg edema   Good muscle tone, joints appear normal , no effusions, Normal ROM.  Marland Kitchen No Palpable Lymph Nodes in Neck or Axillae   Data Reviewed: Basic Metabolic Panel:  Recent Labs Lab 07/24/12 1520 07/25/12 0215 07/26/12 0512 07/27/12 0435 07/29/12 0451  NA 135 136 133* 135 134*  K 3.5 3.1* 2.7* 3.1* 3.4*  CL 100 99 95* 96 97  CO2 22 26 30 30 30   GLUCOSE 108* 137* 117* 103* 107*  BUN 24* 26* 25* 24* 24*  CREATININE 2.26* 2.20* 1.98* 1.86* 2.00*  CALCIUM 8.7 8.5 8.4 8.4  8.5  MG  --  1.9 1.8  --   --    Liver Function Tests: No results found for this basename: AST, ALT, ALKPHOS, BILITOT, PROT, ALBUMIN,  in the last 168 hours No results found for this basename: LIPASE, AMYLASE,  in the last 168 hours No results found for this basename: AMMONIA,  in the last 168 hours CBC:  Recent Labs Lab 07/24/12 1520 07/25/12 0215  WBC 7.8 8.3  HGB 11.0* 11.1*  HCT 35.0* 33.9*  MCV 75.6* 75.3*  PLT 240 229   Cardiac Enzymes:  Recent Labs Lab 07/24/12 1521 07/24/12 2013 07/25/12 0215 07/25/12 0755   TROPONINI <0.30 <0.30 <0.30 <0.30   BNP (last 3 results)  Recent Labs  03/13/12 0500 07/24/12 1520 07/26/12 0513  PROBNP 541.9* 6505.0* 3467.0*   CBG: No results found for this basename: GLUCAP,  in the last 168 hours  Recent Results (from the past 240 hour(s))  URINE CULTURE     Status: None   Collection Time    07/24/12 10:45 PM      Result Value Range Status   Specimen Description URINE, RANDOM   Final   Special Requests Normal   Final   Culture  Setup Time 07/25/2012 08:53   Final   Colony Count NO GROWTH   Final   Culture NO GROWTH   Final   Report Status 07/26/2012 FINAL   Final     Studies: No results found.  Scheduled Meds: . amLODipine  5 mg Oral Daily  . aspirin EC  81 mg Oral Daily  . furosemide  60 mg Intravenous Q12H  . heparin  5,000 Units Subcutaneous Q8H  . hydrALAZINE  37.5 mg Oral BID  . isosorbide dinitrate  10 mg Oral TID  . levofloxacin  750 mg Oral Q24H  . metoprolol tartrate  100 mg Oral BID  . multivitamin with minerals  1 tablet Oral Daily  . potassium chloride  60 mEq Oral Daily  . potassium chloride  40 mEq Oral Once  . simvastatin  20 mg Oral q1800  . sodium chloride  3 mL Intravenous Q12H  . sodium chloride  3 mL Intravenous Q12H  . spironolactone  25 mg Oral Daily   Continuous Infusions:   Principal Problem:   Acute on chronic combined systolic and diastolic congestive heart failure Active Problems:   OBESITY   HYPERTENSION   Obstructive sleep apnea   SOB (shortness of breath)   CKD (chronic kidney disease), stage III   Acute on chronic renal insufficiency   NICM, EF 30-35% by 2D   07/25/12   S/P cardiac catheterization, 01/21/11 with normal coronary arteries   Hypokalemia   Viral diarrhea        Devon Ortiz  [facility] Pager [phone] If 7PM-7AM, please contact night-coverage at [email or web address], password [facility] 07/29/2012, 7:36 PM  LOS: 5 days

## 2012-07-29 NOTE — Progress Notes (Signed)
ANTIBIOTIC CONSULT NOTE - FOLLOW UP  Pharmacy Consult for Levaquin Indication: Community-acquired pneumonia  No Known Allergies  Patient Measurements: Height: 5\' 8"  (172.7 cm) Weight: 269 lb 13.5 oz (122.4 kg) IBW/kg (Calculated) : 68.4   Vital Signs: Temp: 97.6 F (36.4 C) (04/06 9147) Temp src: Oral (04/06 0637) BP: 150/110 mmHg (04/06 0637) Pulse Rate: 83 (04/06 0637) Intake/Output from previous day: 04/05 0701 - 04/06 0700 In: 1110 [P.O.:960; IV Piggyback:150] Out: 3700 [Urine:3700] Intake/Output from this shift: Total I/O In: 360 [P.O.:360] Out: 975 [Urine:975]  Labs:  Recent Labs  07/27/12 0435 07/29/12 0451  CREATININE 1.86* 2.00*   Estimated Creatinine Clearance: 59.4 ml/min (by C-G formula based on Cr of 2). No results found for this basename: VANCOTROUGH, Leodis Binet, VANCORANDOM, GENTTROUGH, GENTPEAK, GENTRANDOM, TOBRATROUGH, TOBRAPEAK, TOBRARND, AMIKACINPEAK, AMIKACINTROU, AMIKACIN,  in the last 72 hours   Microbiology: Recent Results (from the past 720 hour(s))  URINE CULTURE     Status: None   Collection Time    07/24/12 10:45 PM      Result Value Range Status   Specimen Description URINE, RANDOM   Final   Special Requests Normal   Final   Culture  Setup Time 07/25/2012 08:53   Final   Colony Count NO GROWTH   Final   Culture NO GROWTH   Final   Report Status 07/26/2012 FINAL   Final    Anti-infectives   Start     Dose/Rate Route Frequency Ordered Stop   07/26/12 1600  levofloxacin (LEVAQUIN) IVPB 750 mg     750 mg 100 mL/hr over 90 Minutes Intravenous Every 24 hours 07/26/12 1537        Assessment: 46 yom admitted 4/1 with viral GI illness, SOB 2/2 CHF exacerbation.  CXR 4/1 difficult to exclude infiltrate and CXR 4/3 positive for LLL pnuemonia. Hx CKD stage IV (baseline Scr ~1.75).  Day #4 Levaquin per pharmacy.  Afebrile, WBC wnl on 4/2.  SCr 2, CrCl 47ml/min  Urine cx negative  Legionella and strep pneumo antigens both  negative.  Tolerating PO meds.   Plan:   Cont Levaquin 750mg  q24h but change to PO route.  F/u daily.  Charolotte Eke, PharmD, pager [phone]. 07/29/2012,12:57 PM.

## 2012-07-29 NOTE — Progress Notes (Signed)
I spoke with Devon Ortiz yesterday. Life Vest request sent.   Corine Shelter PA-C 07/29/2012 1:48 PM

## 2012-07-29 NOTE — Progress Notes (Signed)
IV team paged for new iv site. Attempted once and was unsuccessful.

## 2012-07-29 NOTE — Progress Notes (Signed)
Pt seen and offered assistance with his home cpap unit.  Pt stated he was ok and puts it on himself when ready for bed.  Pt was advised that RT available all night should he need further assistance.

## 2012-07-30 DIAGNOSIS — I5043 Acute on chronic combined systolic (congestive) and diastolic (congestive) heart failure: Secondary | ICD-10-CM

## 2012-07-30 LAB — BASIC METABOLIC PANEL
BUN: 24 mg/dL — ABNORMAL HIGH (ref 6–23)
CO2: 28 mEq/L (ref 19–32)
Calcium: 8.7 mg/dL (ref 8.4–10.5)
GFR calc non Af Amer: 37 mL/min — ABNORMAL LOW (ref >90)
Glucose, Bld: 93 mg/dL (ref 70–99)
Sodium: 140 mEq/L (ref 135–145)

## 2012-07-30 MED ORDER — SPIRONOLACTONE 25 MG PO TABS: 25.0000 mg | ORAL_TABLET | Freq: Every day | ORAL | Status: DC

## 2012-07-30 MED ORDER — FUROSEMIDE 20 MG PO TABS: 60.0000 mg | ORAL_TABLET | Freq: Two times a day (BID) | ORAL | Status: DC

## 2012-07-30 MED ORDER — ISOSORBIDE DINITRATE 10 MG PO TABS: 10.0000 mg | ORAL_TABLET | Freq: Three times a day (TID) | ORAL | Status: DC

## 2012-07-30 MED ORDER — LEVOFLOXACIN 750 MG PO TABS: 750.0000 mg | ORAL_TABLET | ORAL | Status: DC

## 2012-07-30 MED ORDER — FUROSEMIDE 40 MG PO TABS
60.0000 mg | ORAL_TABLET | Freq: Two times a day (BID) | ORAL | Status: DC
  Administered 2012-07-30 – 2012-07-31 (×2): 60 mg via ORAL
  Filled 2012-07-30 (×4): qty 1

## 2012-07-30 MED ORDER — POLYETHYLENE GLYCOL 3350 17 G PO PACK: 17.0000 g | PACK | Freq: Every day | ORAL | Status: DC | PRN

## 2012-07-30 NOTE — Progress Notes (Signed)
Received call from Dorothea Ogle with [employer] about the patient's life vest; she is still working on it and awaiting for the vest/ lease approval -pt is self pay but will have a job in 2 months; [place] plan to fit the patient for the vest around 5:00 - 5:30pm today

## 2012-07-30 NOTE — Progress Notes (Signed)
[facility]  Subjective: Breathing better. LEE improving.  Objective: Vital signs in last 24 hours: Temp:  [97.7 F (36.5 C)-98.4 F (36.9 C)] 97.9 F (36.6 C) (04/07 0648) Pulse Rate:  [74-83] 81 (04/07 0648) Resp:  [18] 18 (04/06 1300) BP: (129-135)/(87-101) 129/92 mmHg (04/07 0648) SpO2:  [99 %-100 %] 100 % (04/07 0648) Weight:  [119.4 kg (263 lb 3.7 oz)] 119.4 kg (263 lb 3.7 oz) (04/07 0648) Last BM Date: 07/29/12  Intake/Output from previous day: 04/06 0701 - 04/07 0700 In: 370 [P.O.:360; I.V.:10] Out: 2750 [Urine:2750] Intake/Output this shift:    Medications Current Facility-Administered Medications  Medication Dose Route Frequency Provider Last Rate Last Dose  . 0.9 %  sodium chloride infusion  250 mL Intravenous PRN Leroy Sea, MD      . albuterol (PROVENTIL) (5 MG/ML) 0.5% nebulizer solution 2.5 mg  2.5 mg Nebulization Q2H PRN Leroy Sea, MD      . amLODipine (NORVASC) tablet 5 mg  5 mg Oral Daily Leroy Sea, MD   5 mg at 07/29/12 1048  . aspirin EC tablet 81 mg  81 mg Oral Daily Leroy Sea, MD   81 mg at 07/29/12 1048  . cloNIDine (CATAPRES) tablet 0.1 mg  0.1 mg Oral Q6H PRN Leroy Sea, MD   0.1 mg at 07/29/12 4098  . furosemide (LASIX) injection 60 mg  60 mg Intravenous Q12H Leroy Sea, MD   60 mg at 07/29/12 2129  . guaiFENesin-dextromethorphan (ROBITUSSIN DM) 100-10 MG/5ML syrup 5 mL  5 mL Oral Q4H PRN Leroy Sea, MD   5 mL at 07/25/12 2116  . heparin injection 5,000 Units  5,000 Units Subcutaneous Q8H Leroy Sea, MD   5,000 Units at 07/30/12 0507  . hydrALAZINE (APRESOLINE) tablet 37.5 mg  37.5 mg Oral BID Leroy Sea, MD   37.5 mg at 07/29/12 2130  . HYDROcodone-acetaminophen (NORCO/VICODIN) 5-325 MG per tablet 1-2 tablet  1-2 tablet Oral Q4H PRN Leroy Sea, MD   2 tablet at 07/25/12 0325  . isosorbide dinitrate (ISORDIL) tablet 10 mg  10 mg Oral TID Abelino Derrick, PA-C    10 mg at 07/29/12 2129  . levofloxacin (LEVAQUIN) tablet 750 mg  750 mg Oral Q24H Kathlen Mody, MD   750 mg at 07/29/12 1534  . metoprolol (LOPRESSOR) injection 5 mg  5 mg Intravenous Q4H PRN Leroy Sea, MD   5 mg at 07/24/12 1944  . metoprolol tartrate (LOPRESSOR) tablet 100 mg  100 mg Oral BID Leroy Sea, MD   100 mg at 07/29/12 2129  . multivitamin with minerals tablet 1 tablet  1 tablet Oral Daily Leroy Sea, MD   1 tablet at 07/29/12 1049  . ondansetron (ZOFRAN) tablet 4 mg  4 mg Oral Q6H PRN Leroy Sea, MD       Or  . ondansetron (ZOFRAN) injection 4 mg  4 mg Intravenous Q6H PRN Leroy Sea, MD      . polyethylene glycol (MIRALAX / GLYCOLAX) packet 17 g  17 g Oral Daily PRN Leroy Sea, MD      . potassium chloride 20 MEQ/15ML (10%) liquid 60 mEq  60 mEq Oral Daily Kathlen Mody, MD   60 mEq at 07/29/12 1050  . potassium chloride SA (K-DUR,KLOR-CON) CR tablet 40 mEq  40 mEq Oral Once Leroy Sea, MD      . simvastatin (ZOCOR) tablet 20  mg  20 mg Oral q1800 Leroy Sea, MD   20 mg at 07/29/12 1724  . sodium chloride 0.9 % injection 3 mL  3 mL Intravenous Q12H Leroy Sea, MD   3 mL at 07/29/12 2131  . sodium chloride 0.9 % injection 3 mL  3 mL Intravenous PRN Leroy Sea, MD      . sodium chloride 0.9 % injection 3 mL  3 mL Intravenous Q12H Leroy Sea, MD   3 mL at 07/29/12 2131  . spironolactone (ALDACTONE) tablet 25 mg  25 mg Oral Daily Wilburt Finlay, PA-C   25 mg at 07/29/12 1048  . zolpidem (AMBIEN) tablet 5 mg  5 mg Oral QHS PRN Rolan Lipa, NP   5 mg at 07/29/12 0034    PE: General appearance: alert, cooperative and no distress Lungs: clear to auscultation bilaterally Heart: regular rate and rhythm, S1, S2 normal, no murmur, click, rub or gallop Extremities: 1+ LEE Pulses: 2+ and symmetric Neurologic: Grossly normal  Lab Results:  No results found for this basename: WBC, HGB, HCT, PLT,  in the last 72  hours BMET  Recent Labs  07/29/12 0451  NA 134*  K 3.4*  CL 97  CO2 30  GLUCOSE 107*  BUN 24*  CREATININE 2.00*  CALCIUM 8.5    Assessment/Plan   Principal Problem:   Acute on chronic combined systolic and diastolic congestive heart failure Active Problems:   OBESITY   HYPERTENSION   Obstructive sleep apnea   SOB (shortness of breath)   CKD (chronic kidney disease), stage III   Acute on chronic renal insufficiency   NICM, EF 30-35% by 2D   07/25/12   S/P cardiac catheterization, 01/21/11 with normal coronary arteries   Hypokalemia   Viral diarrhea  Plan:  Diuresing well.  On IV lasix.  Consider switching to PO today.  Net fluids: -2.3L(24hrs)/-4.9L(48hrs).  BP stable and controlled.  He had a 6 beat run of NSVT.  Lifevest ordered over the weekend.  Lopressor at 100mg  BID.  Will recheck BNP today.  He looks much better.     LOS: 6 days    HAGER, BRYAN 07/30/2012 7:39 AM  I have seen and examined the patient along with HAGER, BRYAN, PA.  I have reviewed the chart, notes and new data.  I agree with PA's note.  Key new complaints: feels generally well, dyspnea much better Key examination changes: still grossly edematous Key new findings / data: BNP much lower, but not yet at baseline of around 500. Creat stable around 2. 25 lb weight loss!  PLAN: If we can arrange LifeVest he is ready for DC, with plans for additional steady slow diuresis as an outpatient. Critically important that he should comply with medications, CPAP and salt restriction. Needs early F/U.  Thurmon Fair, MD, [facility] [phone] 07/30/2012, 4:38 PM

## 2012-07-30 NOTE — Progress Notes (Signed)
Devon Ortiz with [employer] [phone]) called to see what time will the patient be fitted for his life vest and if any additional information needed; [address] Filter RN,BSN,MHA

## 2012-07-30 NOTE — Progress Notes (Signed)
[facility] PROGRESS NOTE  Jerremy Maione ZOX:[id number] DOB: 05/09/1965 DOA: 07/24/2012 PCP: Dorrene German, MD  Assessment/Plan:   1. Shortness of breath due to acute on chronic combined systolic and diastolic heart failure. Last echo shows grade 3 diastolic dysfunction and EF of around 35%, patient was admitted to telemetry bed, sodium & fluid restriction, nitro paste, IV Lasix, beta blocker, will not be able to give ACE/ARB due to acute renal insufficiency. Improvement in the pro bnp.  I/O last 3 completed shifts: In: 490 [P.O.:480; I.V.:10] Out: 3900 [Urine:3900]     Cardiology consult appreciated. He has diuresed appropriately, possibly change his IV lasix to po lasix in am. Life vest on Monday. BMP pending.   2. Acute renal failure on chronic kidney disease stage IV. Baseline creatinine appears to be around 1.75, at this time clear evidence of massive fluid overload especially in the lower extremities, IV Lasix, obtain urine electrolytes, repeat BMP in the morning showed improvement in creatinine. Avoid ACE/ARB, NSAIDs, nephrotoxins.   3. Hypertension better controlled today. , home medications will be continued, will add when necessary IV Lopressor and Catapres as needed. With diuresis and shortness of breath being addressed blood pressure should improve.   4. Mild gastroenteritis induced diarrheal illness.stool workup revealed noro virus.improving.   5. Hypokalemia; replete as needed. Magnesium level within normal limits.   6. Fever: CXR finding of pneumonia, started him on levaquin. Urine for streptococcal and legionella antigen are pending.   DVT Prophylaxis Heparin   Family Communication: none at bedside.    Code Status Full     Consultants:  cardiology  HPI/Subjective: Sob improved. And in good spirits.    Objective: Filed Vitals:   07/29/12 2213 07/30/12 0648 07/30/12 1423 07/30/12 2132  BP: 129/87 129/92 130/82 150/100  Pulse: 83 81 80 100  Temp: 98.4  F (36.9 C) 97.9 F (36.6 C) 97.3 F (36.3 C)   TempSrc: Oral Oral Oral   Resp:      Height:      Weight:  119.4 kg (263 lb 3.7 oz)    SpO2: 99% 100% 100%     Intake/Output Summary (Last 24 hours) at 07/30/12 2238 Last data filed at 07/30/12 1532  Gross per 24 hour  Intake    120 ml  Output   1350 ml  Net  -1230 ml   Filed Weights   07/28/12 0636 07/29/12 0637 07/30/12 0648  Weight: 125.6 kg (276 lb 14.4 oz) 122.4 kg (269 lb 13.5 oz) 119.4 kg (263 lb 3.7 oz)    Exam: Alert afebrile comfortable at rest.  Symmetrical Chest wall movement, Good air movement bilaterally, basilar rales   RRR, No Gallops, Rubs or Murmurs, No Parasternal Heave.   Positive Bowel Sounds, Abdomen Soft, Non tender, No organomegaly appriciated,No rebound -guarding or rigidity.   No Cyanosis, Normal Skin Turgor, No Skin Rash or Bruise. 2+ leg edema   Good muscle tone, joints appear normal , no effusions, Normal ROM.  Marland Kitchen No Palpable Lymph Nodes in Neck or Axillae   Data Reviewed: Basic Metabolic Panel:  Recent Labs Lab 07/25/12 0215 07/26/12 0512 07/27/12 0435 07/29/12 0451 07/30/12 0825  NA 136 133* 135 134* 140  K 3.1* 2.7* 3.1* 3.4* 3.6  CL 99 95* 96 97 100  CO2 26 30 30 30 28   GLUCOSE 137* 117* 103* 107* 93  BUN 26* 25* 24* 24* 24*  CREATININE 2.20* 1.98* 1.86* 2.00* 2.05*  CALCIUM 8.5 8.4 8.4 8.5 8.7  MG 1.9 1.8  --   --   --  Liver Function Tests: No results found for this basename: AST, ALT, ALKPHOS, BILITOT, PROT, ALBUMIN,  in the last 168 hours No results found for this basename: LIPASE, AMYLASE,  in the last 168 hours No results found for this basename: AMMONIA,  in the last 168 hours CBC:  Recent Labs Lab 07/24/12 1520 07/25/12 0215  WBC 7.8 8.3  HGB 11.0* 11.1*  HCT 35.0* 33.9*  MCV 75.6* 75.3*  PLT 240 229   Cardiac Enzymes:  Recent Labs Lab 07/24/12 1521 07/24/12 2013 07/25/12 0215 07/25/12 0755  TROPONINI <0.30 <0.30 <0.30 <0.30   BNP (last 3  results)  Recent Labs  07/24/12 1520 07/26/12 0513 07/30/12 0825  PROBNP 6505.0* 3467.0* 1559.0*   CBG: No results found for this basename: GLUCAP,  in the last 168 hours  Recent Results (from the past 240 hour(s))  URINE CULTURE     Status: None   Collection Time    07/24/12 10:45 PM      Result Value Range Status   Specimen Description URINE, RANDOM   Final   Special Requests Normal   Final   Culture  Setup Time 07/25/2012 08:53   Final   Colony Count NO GROWTH   Final   Culture NO GROWTH   Final   Report Status 07/26/2012 FINAL   Final     Studies: No results found.  Scheduled Meds: . amLODipine  5 mg Oral Daily  . aspirin EC  81 mg Oral Daily  . furosemide  60 mg Oral BID  . heparin  5,000 Units Subcutaneous Q8H  . hydrALAZINE  37.5 mg Oral BID  . isosorbide dinitrate  10 mg Oral TID  . levofloxacin  750 mg Oral Q24H  . metoprolol tartrate  100 mg Oral BID  . multivitamin with minerals  1 tablet Oral Daily  . potassium chloride  60 mEq Oral Daily  . potassium chloride  40 mEq Oral Once  . simvastatin  20 mg Oral q1800  . sodium chloride  3 mL Intravenous Q12H  . sodium chloride  3 mL Intravenous Q12H  . spironolactone  25 mg Oral Daily   Continuous Infusions:   Principal Problem:   Acute on chronic combined systolic and diastolic congestive heart failure Active Problems:   OBESITY   HYPERTENSION   Obstructive sleep apnea   SOB (shortness of breath)   CKD (chronic kidney disease), stage III   Acute on chronic renal insufficiency   NICM, EF 30-35% by 2D   07/25/12   S/P cardiac catheterization, 01/21/11 with normal coronary arteries   Hypokalemia   Viral diarrhea        Claritza July  [facility] Pager [phone] If 7PM-7AM, please contact night-coverage at [email or web address], password [facility] 07/30/2012, 10:38 PM  LOS: 6 days

## 2012-07-31 MED ORDER — FUROSEMIDE 40 MG PO TABS
40.0000 mg | ORAL_TABLET | Freq: Two times a day (BID) | ORAL | Status: DC
  Filled 2012-07-31 (×2): qty 1

## 2012-07-31 MED ORDER — FUROSEMIDE 20 MG PO TABS: 40.0000 mg | ORAL_TABLET | Freq: Two times a day (BID) | ORAL | Status: DC

## 2012-07-31 MED ORDER — POTASSIUM CHLORIDE 20 MEQ/15ML (10%) PO LIQD: 40.0000 meq | Freq: Every day | ORAL | Status: DC

## 2012-07-31 NOTE — Discharge Summary (Signed)
Physician Discharge Summary  Shun Pletz ZOX:[id number] DOB: 1966/03/18 DOA: 07/24/2012  PCP: Dorrene German, MD  Admit date: 07/24/2012 Discharge date: 07/31/2012    Recommendations for Outpatient Follow-up:  Follow up with cardiology as recommended Follow up with PCP i none week Follow up with CXR in one week.  Discharge Diagnoses:  Principal Problem:   Acute on chronic combined systolic and diastolic congestive heart failure Active Problems:   OBESITY   HYPERTENSION   Obstructive sleep apnea   SOB (shortness of breath)   CKD (chronic kidney disease), stage III   Acute on chronic renal insufficiency   NICM, EF 30-35% by 2D   07/25/12   S/P cardiac catheterization, 01/21/11 with normal coronary arteries   Hypokalemia   Viral diarrhea   Discharge Condition: fair  Diet recommendation: low salt diet  Filed Weights   07/29/12 0637 07/30/12 0648 07/31/12 8119  Weight: 122.4 kg (269 lb 13.5 oz) 119.4 kg (263 lb 3.7 oz) 118.2 kg (260 lb 9.3 oz)    History of present illness:  Devon Ortiz is a 47 y.o. male, with history of obesity, hypertension, chronic kidney disease stage IV baseline creatinine around 1.75, LVH, combined systolic and diastolic heart failure who was in his usual state of health however in the last 1 month he has noticed progressive weight gain, leg edema and shortness of breath, he is also developed some orthopnea, symptoms progressively getting better to the point that he had to come to the ER for shortness of breath. He also says that for the last 3 days he has developed mild diarrhea nausea vomiting after eating some salad at a restaurant, this actually is improving. No exposure to sick contacts her antibiotics.  In the ER workup suggestive of acute on chronic combined systolic and diastolic heart failure, acute on chronic renal failure, I was called to admit the patient.  Hospital Course:  1. Shortness of breath due to acute on chronic combined systolic and  diastolic heart failure. Last echo shows grade 3 diastolic dysfunction and EF of around 35%, patient was admitted to telemetry bed, sodium & fluid restriction, nitro paste, IV Lasix, beta blocker, will not be able to give ACE/ARB due to acute renal insufficiency. Improvement in the pro bnp over teh course of the hospitalization. His lasix was changed to po lasix and discharged home . Life vest was given .  I/O last 3 completed shifts:  In: 490 [P.O.:480; I.V.:10]  Out: 3900 [Urine:3900]    2. Acute renal failure on chronic kidney disease stage IV. Baseline creatinine appears to be around 1.75, at this time clear evidence of massive fluid overload especially in the lower extremities,Avoid ACE/ARB, NSAIDs, nephrotoxins.  3. Hypertension better controlled today. , home medications will be continued, added isodil and hydralazine. Nephrotoxins avoided.  4. Mild gastroenteritis induced diarrheal illness.stool workup revealed noro virus a nd improving.  5. Hypokalemia; replete as needed. Magnesium level within normal limits.  6. Fever: CXR finding of pneumonia, started him on levaquin. Urine for streptococcal and legionella antigen are negative. He was discharged on levaquin to complete the course. Recommend getting a CXR in [facility], to make sure pneumonia has resolved.   Consultations:  cardiology  Discharge Exam: Filed Vitals:   07/30/12 2132 07/30/12 2254 07/31/12 0632 07/31/12 0700  BP: 150/100  146/112 132/100  Pulse: 100 85 80   Temp:  98.5 F (36.9 C) 97.3 F (36.3 C)   TempSrc:  Oral Axillary   Resp:  18 16  Height:      Weight:   118.2 kg (260 lb 9.3 oz)   SpO2:  99% 99%     Alert afebrile comfortable at rest.  Symmetrical Chest wall movement, Good air movement bilaterally, basilar rales  RRR, No Gallops, Rubs or Murmurs, No Parasternal Heave.  Positive Bowel Sounds, Abdomen Soft, Non tender, No organomegaly appriciated,No rebound -guarding or rigidity.  No Cyanosis, Normal Skin  Turgor, No Skin Rash or Bruise. 2+ leg edema  Good muscle tone, joints appear normal , no effusions, Normal ROM.  Marland Kitchen No Palpable Lymph Nodes in Neck or Axillae   Discharge Instructions  Discharge Orders   Future Orders Complete By Expires     (HEART FAILURE PATIENTS) Call MD:  Anytime you have any of the following symptoms: 1) 3 pound weight gain in 24 hours or 5 pounds in 1 week 2) shortness of breath, with or without a dry hacking cough 3) swelling in the hands, feet or stomach 4) if you have to sleep on extra pillows at night in order to breathe.  As directed     Diet - low sodium heart healthy  As directed     Discharge instructions  As directed     Comments:      Follow up with [facility] in one week. Follow up with PCP in one week, and please obtain a CXR to evaluate for resolution of the pneumonia.  Follow up with BMP in one week to check electrolytes.        Medication List    STOP taking these medications       isosorbide mononitrate 30 MG 24 hr tablet  Commonly known as:  IMDUR     potassium chloride 20 MEQ packet  Commonly known as:  KLOR-CON      TAKE these medications       amLODipine 5 MG tablet  Commonly known as:  NORVASC  Take 1 tablet (5 mg total) by mouth daily.     aspirin EC 81 MG tablet  Take 81 mg by mouth daily.     furosemide 20 MG tablet  Commonly known as:  LASIX  Take 2 tablets (40 mg total) by mouth 2 (two) times daily.     hydrALAZINE 25 MG tablet  Commonly known as:  APRESOLINE  Take 1.5 tablets (37.5 mg total) by mouth 2 (two) times daily.     isosorbide dinitrate 10 MG tablet  Commonly known as:  ISORDIL  Take 1 tablet (10 mg total) by mouth 3 (three) times daily.     levofloxacin 750 MG tablet  Commonly known as:  LEVAQUIN  Take 1 tablet (750 mg total) by mouth daily.     metoprolol 100 MG tablet  Commonly known as:  LOPRESSOR  Take 1 tablet (100 mg total) by mouth 2 (two) times daily.     multivitamin with minerals Tabs  Take 1  tablet by mouth daily.     polyethylene glycol packet  Commonly known as:  MIRALAX / GLYCOLAX  Take 17 g by mouth daily as needed.     potassium chloride 20 MEQ/15ML (10%) solution  Take 30 mLs (40 mEq total) by mouth daily.  Start taking on:  08/01/2012     pravastatin 40 MG tablet  Commonly known as:  PRAVACHOL  Take 1 tablet (40 mg total) by mouth daily.     spironolactone 25 MG tablet  Commonly known as:  ALDACTONE  Take 1 tablet (25 mg total) by  mouth daily.           Follow-up Information   Follow up with AVBUERE,EDWIN A, MD In 1 week.   Contact information:   [address] [place] [address] [phone]       Follow up with Fleet Contras A, MD. Schedule an appointment as soon as possible for a visit in 1 week.   Contact information:   [address] [place] [address] [phone]       Follow up with Abelino Derrick, PA-C On 08/07/2012. (2pm)    Contact information:   [address] [place] [address] [phone]        The results of significant diagnostics from this hospitalization (including imaging, microbiology, ancillary and laboratory) are listed below for reference.    Significant Diagnostic Studies: Dg Chest 2 View  07/26/2012  *RADIOLOGY REPORT*  Clinical Data: Fever and weakness.  CHEST - 2 VIEW  Comparison: Chest x-ray 07/24/2012.  Findings: Retrocardiac opacity obscuring the left hemidiaphragm, concerning for pneumonia.  Small left pleural effusion.  Right lung appears clear.  No pulmonary edema.  Heart size is mildly enlarged (unchanged).  Upper mediastinal contours are unremarkable.  IMPRESSION: 1.  Left lower lobe pneumonia with probable small left parapneumonic pleural effusion.  Follow-up radiographs after appropriate trial of antimicrobial therapy is recommended to exclude a centrally obstructing lesion. 2.  Mild cardiomegaly.   Original Report Authenticated By: Trudie Reed, M.D.    Dg Chest Port 1  View  07/24/2012  *RADIOLOGY REPORT*  Clinical Data: Shortness of breath.  PORTABLE CHEST - 1 VIEW  Comparison: 03/11/2012.  Findings: Cardiomegaly.  Pulmonary vascular congestion/mild pulmonary edema.  Poor delineation in the left lung base.  Difficult to exclude left base atelectasis or infiltrate.  No gross pneumothorax.  The patient would eventually benefit from follow-up two-view chest with cardiac leads removed.  IMPRESSION: Cardiomegaly.  Pulmonary vascular congestion/mild pulmonary edema.  Poor delineation in the left lung base.  Difficult to exclude left base atelectasis or infiltrate.   Original Report Authenticated By: Lacy Duverney, M.D.     Microbiology: Recent Results (from the past 240 hour(s))  URINE CULTURE     Status: None   Collection Time    07/24/12 10:45 PM      Result Value Range Status   Specimen Description URINE, RANDOM   Final   Special Requests Normal   Final   Culture  Setup Time 07/25/2012 08:53   Final   Colony Count NO GROWTH   Final   Culture NO GROWTH   Final   Report Status 07/26/2012 FINAL   Final     Labs: Basic Metabolic Panel:  Recent Labs Lab 07/25/12 0215 07/26/12 0512 07/27/12 0435 07/29/12 0451 07/30/12 0825  NA 136 133* 135 134* 140  K 3.1* 2.7* 3.1* 3.4* 3.6  CL 99 95* 96 97 100  CO2 26 30 30 30 28   GLUCOSE 137* 117* 103* 107* 93  BUN 26* 25* 24* 24* 24*  CREATININE 2.20* 1.98* 1.86* 2.00* 2.05*  CALCIUM 8.5 8.4 8.4 8.5 8.7  MG 1.9 1.8  --   --   --    Liver Function Tests: No results found for this basename: AST, ALT, ALKPHOS, BILITOT, PROT, ALBUMIN,  in the last 168 hours No results found for this basename: LIPASE, AMYLASE,  in the last 168 hours No results found for this basename: AMMONIA,  in the last 168 hours CBC:  Recent Labs Lab 07/24/12 1520 07/25/12 0215  WBC 7.8 8.3  HGB 11.0* 11.1*  HCT 35.0* 33.9*  MCV 75.6* 75.3*  PLT 240 229   Cardiac Enzymes:  Recent Labs Lab 07/24/12 1521 07/24/12 2013  07/25/12 0215 07/25/12 0755  TROPONINI <0.30 <0.30 <0.30 <0.30   BNP: BNP (last 3 results)  Recent Labs  07/24/12 1520 07/26/12 0513 07/30/12 0825  PROBNP 6505.0* 3467.0* 1559.0*   CBG: No results found for this basename: GLUCAP,  in the last 168 hours     Signed:  Idriss Quackenbush  [facility] 07/31/2012, 12:26 PM

## 2012-07-31 NOTE — Progress Notes (Signed)
Subjective:  SOB and edema improved   Objective:  Vital Signs in the last 24 hours: Temp:  [97.3 F (36.3 C)-98.5 F (36.9 C)] 97.3 F (36.3 C) (04/08 1610) Pulse Rate:  [80-100] 80 (04/08 0632) Resp:  [16-18] 16 (04/08 0632) BP: (130-150)/(82-112) 132/100 mmHg (04/08 0700) SpO2:  [99 %-100 %] 99 % (04/08 0632) Weight:  [118.2 kg (260 lb 9.3 oz)] 118.2 kg (260 lb 9.3 oz) (04/08 9604)  Intake/Output from previous day:  Intake/Output Summary (Last 24 hours) at 07/31/12 1141 Last data filed at 07/31/12 5409  Gross per 24 hour  Intake      0 ml  Output   1150 ml  Net  -1150 ml    Physical Exam: General appearance: alert, cooperative and no distress Lungs: clear to auscultation bilaterally Heart: regular rate and rhythm Extrem- no significant edema   Rate: 80  Rhythm: normal sinus rhythm  Lab Results: No results found for this basename: WBC, HGB, PLT,  in the last 72 hours  Recent Labs  07/29/12 0451 07/30/12 0825  NA 134* 140  K 3.4* 3.6  CL 97 100  CO2 30 28  GLUCOSE 107* 93  BUN 24* 24*  CREATININE 2.00* 2.05*   No results found for this basename: TROPONINI, CK, MB,  in the last 72 hours Hepatic Function Panel No results found for this basename: PROT, ALBUMIN, AST, ALT, ALKPHOS, BILITOT, BILIDIR, IBILI,  in the last 72 hours No results found for this basename: CHOL,  in the last 72 hours No results found for this basename: INR,  in the last 72 hours  Imaging: Imaging results have been reviewed  Cardiac Studies:  Assessment/Plan:   Principal Problem:   Acute on chronic combined systolic and diastolic congestive heart failure Active Problems:   SOB (shortness of breath)   Acute on chronic renal insufficiency   HYPERTENSION   CKD (chronic kidney disease), stage III   NICM, EF 30-35% by 2D   07/25/12   Hypokalemia   Viral diarrhea   OBESITY   Obstructive sleep apnea   S/P cardiac catheterization, 01/21/11 with normal coronary arteries   Plan- He is  stable for discharge from our standpoint. He has diuresed 28lbs. He will follow up in 5 days, TCM phone call in 48hrs. Decrease Lasix to 40mg  BID, decrease K+ to daily, continue Aldactone. On Hydralazine and Isordil, not on ACE/ARB secondary to SCr 2.  BMP on follow up. Echo in 3 months.   Corine Shelter PA-C 07/31/2012, 11:41 AM   I have seen and examined the patient along with Corine Shelter PA-C.  I have reviewed the chart, notes and new data.  I agree with PA's note.  Key new complaints: denies dyspnea with walking Key examination changes: obesity limits a detailed evaluation for signs of hypervolemia, but not overtly edematous. Discharge weight 118 kg (260.5 lb) Key new findings / data: BNP 1559, previous baseline around 500, so may still need diuresis. Creat of 2.0 is not far from baseline.  PLAN: Meds as described above. He needs to prove compliance with medical therapy - he has failed this in the past, by his report due to financial constraints. Current medical regimen is not very expensive and hopefully he will soon be re-mployed.  Thurmon Fair, MD, [facility] [phone] 07/31/2012, 3:06 PM

## 2012-09-06 ENCOUNTER — Encounter: Payer: Self-pay | Admitting: Cardiology

## 2012-10-09 ENCOUNTER — Ambulatory Visit: Payer: Self-pay | Admitting: Cardiology

## 2012-10-09 ENCOUNTER — Ambulatory Visit: Payer: Self-pay | Admitting: Physician Assistant

## 2012-12-24 DIAGNOSIS — I639 Cerebral infarction, unspecified: Secondary | ICD-10-CM

## 2012-12-24 HISTORY — DX: Cerebral infarction, unspecified: I63.9

## 2012-12-26 ENCOUNTER — Inpatient Hospital Stay (HOSPITAL_COMMUNITY)
Admission: EM | Admit: 2012-12-26 | Discharge: 2013-01-07 | DRG: 291 | Disposition: A | Discharge status: Home Health | Payer: Medicaid Other | Attending: Cardiology | Admitting: Cardiology

## 2012-12-26 ENCOUNTER — Emergency Department (HOSPITAL_COMMUNITY): Payer: Medicaid Other

## 2012-12-26 ENCOUNTER — Encounter (HOSPITAL_COMMUNITY): Payer: Self-pay | Admitting: Emergency Medicine

## 2012-12-26 DIAGNOSIS — Z9119 Patient's noncompliance with other medical treatment and regimen: Secondary | ICD-10-CM

## 2012-12-26 DIAGNOSIS — I5042 Chronic combined systolic (congestive) and diastolic (congestive) heart failure: Secondary | ICD-10-CM

## 2012-12-26 DIAGNOSIS — Z9889 Other specified postprocedural states: Secondary | ICD-10-CM

## 2012-12-26 DIAGNOSIS — I639 Cerebral infarction, unspecified: Secondary | ICD-10-CM

## 2012-12-26 DIAGNOSIS — N189 Chronic kidney disease, unspecified: Secondary | ICD-10-CM

## 2012-12-26 DIAGNOSIS — R112 Nausea with vomiting, unspecified: Secondary | ICD-10-CM

## 2012-12-26 DIAGNOSIS — D509 Iron deficiency anemia, unspecified: Secondary | ICD-10-CM | POA: Diagnosis present

## 2012-12-26 DIAGNOSIS — I5043 Acute on chronic combined systolic (congestive) and diastolic (congestive) heart failure: Principal | ICD-10-CM

## 2012-12-26 DIAGNOSIS — I1310 Hypertensive heart and chronic kidney disease without heart failure, with stage 1 through stage 4 chronic kidney disease, or unspecified chronic kidney disease: Secondary | ICD-10-CM

## 2012-12-26 DIAGNOSIS — E785 Hyperlipidemia, unspecified: Secondary | ICD-10-CM | POA: Diagnosis present

## 2012-12-26 DIAGNOSIS — R0600 Dyspnea, unspecified: Secondary | ICD-10-CM

## 2012-12-26 DIAGNOSIS — Z6841 Body Mass Index (BMI) 40.0 and over, adult: Secondary | ICD-10-CM

## 2012-12-26 DIAGNOSIS — R471 Dysarthria and anarthria: Secondary | ICD-10-CM | POA: Diagnosis not present

## 2012-12-26 DIAGNOSIS — D631 Anemia in chronic kidney disease: Secondary | ICD-10-CM | POA: Diagnosis present

## 2012-12-26 DIAGNOSIS — I428 Other cardiomyopathies: Secondary | ICD-10-CM

## 2012-12-26 DIAGNOSIS — Z91199 Patient's noncompliance with other medical treatment and regimen due to unspecified reason: Secondary | ICD-10-CM

## 2012-12-26 DIAGNOSIS — R0602 Shortness of breath: Secondary | ICD-10-CM

## 2012-12-26 DIAGNOSIS — I10 Essential (primary) hypertension: Secondary | ICD-10-CM

## 2012-12-26 DIAGNOSIS — T502X5A Adverse effect of carbonic-anhydrase inhibitors, benzothiadiazides and other diuretics, initial encounter: Secondary | ICD-10-CM | POA: Diagnosis present

## 2012-12-26 DIAGNOSIS — I498 Other specified cardiac arrhythmias: Secondary | ICD-10-CM | POA: Diagnosis present

## 2012-12-26 DIAGNOSIS — Z23 Encounter for immunization: Secondary | ICD-10-CM

## 2012-12-26 DIAGNOSIS — I509 Heart failure, unspecified: Secondary | ICD-10-CM

## 2012-12-26 DIAGNOSIS — R29810 Facial weakness: Secondary | ICD-10-CM | POA: Diagnosis not present

## 2012-12-26 DIAGNOSIS — Z7982 Long term (current) use of aspirin: Secondary | ICD-10-CM

## 2012-12-26 DIAGNOSIS — N183 Chronic kidney disease, stage 3 unspecified: Secondary | ICD-10-CM

## 2012-12-26 DIAGNOSIS — I6350 Cerebral infarction due to unspecified occlusion or stenosis of unspecified cerebral artery: Secondary | ICD-10-CM | POA: Diagnosis not present

## 2012-12-26 DIAGNOSIS — I130 Hypertensive heart and chronic kidney disease with heart failure and stage 1 through stage 4 chronic kidney disease, or unspecified chronic kidney disease: Secondary | ICD-10-CM | POA: Diagnosis present

## 2012-12-26 DIAGNOSIS — E876 Hypokalemia: Secondary | ICD-10-CM

## 2012-12-26 DIAGNOSIS — I5189 Other ill-defined heart diseases: Secondary | ICD-10-CM | POA: Diagnosis present

## 2012-12-26 DIAGNOSIS — Z79899 Other long term (current) drug therapy: Secondary | ICD-10-CM

## 2012-12-26 DIAGNOSIS — G4733 Obstructive sleep apnea (adult) (pediatric): Secondary | ICD-10-CM

## 2012-12-26 DIAGNOSIS — N184 Chronic kidney disease, stage 4 (severe): Secondary | ICD-10-CM

## 2012-12-26 DIAGNOSIS — D649 Anemia, unspecified: Secondary | ICD-10-CM | POA: Diagnosis present

## 2012-12-26 HISTORY — DX: Patient's noncompliance with other medical treatment and regimen due to unspecified reason: Z91.199

## 2012-12-26 HISTORY — DX: Patient's noncompliance with other medical treatment and regimen: Z91.19

## 2012-12-26 HISTORY — DX: Essential (primary) hypertension: I10

## 2012-12-26 HISTORY — DX: Obstructive sleep apnea (adult) (pediatric): G47.33

## 2012-12-26 HISTORY — DX: Other cardiomyopathies: I42.8

## 2012-12-26 HISTORY — DX: Morbid (severe) obesity due to excess calories: E66.01

## 2012-12-26 HISTORY — DX: Body Mass Index (BMI) 40.0 and over, adult: Z684

## 2012-12-26 LAB — BASIC METABOLIC PANEL
Calcium: 8.4 mg/dL (ref 8.4–10.5)
GFR calc Af Amer: 42 mL/min — ABNORMAL LOW (ref >90)
GFR calc non Af Amer: 37 mL/min — ABNORMAL LOW (ref >90)
Glucose, Bld: 143 mg/dL — ABNORMAL HIGH (ref 70–99)
Potassium: 2.8 mEq/L — ABNORMAL LOW (ref 3.5–5.1)
Sodium: 142 mEq/L (ref 135–145)

## 2012-12-26 LAB — CBC
Hemoglobin: 9.7 g/dL — ABNORMAL LOW (ref 13.0–17.0)
MCH: 21.8 pg — ABNORMAL LOW (ref 26.0–34.0)
MCHC: 29.8 g/dL — ABNORMAL LOW (ref 30.0–36.0)
Platelets: 270 10*3/uL (ref 150–400)
RDW: 17.7 % — ABNORMAL HIGH (ref 11.5–15.5)

## 2012-12-26 LAB — POCT I-STAT TROPONIN I: Troponin i, poc: 0.1 ng/mL (ref 0.00–0.08)

## 2012-12-26 LAB — TROPONIN I: Troponin I: <0.3 ng/mL

## 2012-12-26 LAB — MAGNESIUM: Magnesium: 1.8 mg/dL (ref 1.5–2.5)

## 2012-12-26 MED ORDER — ONDANSETRON HCL 4 MG PO TABS: 4.0000 mg | ORAL_TABLET | Freq: Once | ORAL | Status: DC

## 2012-12-26 MED ORDER — CARVEDILOL 25 MG PO TABS
25.0000 mg | ORAL_TABLET | Freq: Two times a day (BID) | ORAL | Status: DC
  Administered 2012-12-26 – 2013-01-07 (×23): 25 mg via ORAL
  Filled 2012-12-26 (×28): qty 1

## 2012-12-26 MED ORDER — PANTOPRAZOLE SODIUM 40 MG IV SOLR
40.0000 mg | INTRAVENOUS | Status: DC
  Administered 2012-12-26 – 2012-12-27 (×2): 40 mg via INTRAVENOUS
  Filled 2012-12-26 (×3): qty 40

## 2012-12-26 MED ORDER — ASPIRIN EC 81 MG PO TBEC
81.0000 mg | DELAYED_RELEASE_TABLET | Freq: Every day | ORAL | Status: DC
  Filled 2012-12-26: qty 1

## 2012-12-26 MED ORDER — ACETAMINOPHEN 325 MG PO TABS
650.0000 mg | ORAL_TABLET | ORAL | Status: DC | PRN
  Filled 2012-12-26: qty 2

## 2012-12-26 MED ORDER — ASPIRIN EC 81 MG PO TBEC
81.0000 mg | DELAYED_RELEASE_TABLET | Freq: Every day | ORAL | Status: DC
  Administered 2012-12-27 – 2013-01-06 (×11): 81 mg via ORAL
  Filled 2012-12-26 (×12): qty 1

## 2012-12-26 MED ORDER — NITROGLYCERIN 2 % TD OINT
1.0000 [in_us] | TOPICAL_OINTMENT | Freq: Once | TRANSDERMAL | Status: AC
  Administered 2012-12-26: 1 [in_us] via TOPICAL

## 2012-12-26 MED ORDER — ASPIRIN 81 MG PO CHEW
324.0000 mg | CHEWABLE_TABLET | Freq: Once | ORAL | Status: AC
  Administered 2012-12-26: 324 mg via ORAL
  Filled 2012-12-26: qty 4

## 2012-12-26 MED ORDER — SODIUM CHLORIDE 0.9 % IJ SOLN: 3.0000 mL | INTRAMUSCULAR | Status: DC | PRN

## 2012-12-26 MED ORDER — ADULT MULTIVITAMIN W/MINERALS CH
1.0000 | ORAL_TABLET | Freq: Every day | ORAL | Status: DC
  Administered 2012-12-27 – 2013-01-07 (×12): 1 via ORAL
  Filled 2012-12-26 (×12): qty 1

## 2012-12-26 MED ORDER — LABETALOL HCL 5 MG/ML IV SOLN
INTRAVENOUS | Status: AC
  Filled 2012-12-26: qty 4

## 2012-12-26 MED ORDER — NITROGLYCERIN 2 % TD OINT
1.0000 [in_us] | TOPICAL_OINTMENT | Freq: Four times a day (QID) | TRANSDERMAL | Status: DC
  Administered 2012-12-26 – 2012-12-28 (×6): 1 [in_us] via TOPICAL
  Filled 2012-12-26: qty 30

## 2012-12-26 MED ORDER — POTASSIUM CHLORIDE CRYS ER 20 MEQ PO TBCR
20.0000 meq | EXTENDED_RELEASE_TABLET | Freq: Every day | ORAL | Status: DC
  Administered 2012-12-26 – 2012-12-27 (×2): 20 meq via ORAL
  Filled 2012-12-26 (×3): qty 1

## 2012-12-26 MED ORDER — ISOSORBIDE MONONITRATE ER 30 MG PO TB24
30.0000 mg | ORAL_TABLET | Freq: Every day | ORAL | Status: DC
  Filled 2012-12-26: qty 1

## 2012-12-26 MED ORDER — FUROSEMIDE 10 MG/ML IJ SOLN
80.0000 mg | Freq: Once | INTRAMUSCULAR | Status: AC
  Administered 2012-12-26: 80 mg via INTRAVENOUS
  Filled 2012-12-26: qty 8

## 2012-12-26 MED ORDER — POTASSIUM CHLORIDE CRYS ER 20 MEQ PO TBCR
60.0000 meq | EXTENDED_RELEASE_TABLET | Freq: Once | ORAL | Status: AC
  Administered 2012-12-26: 60 meq via ORAL
  Filled 2012-12-26: qty 3

## 2012-12-26 MED ORDER — POTASSIUM CHLORIDE 10 MEQ/100ML IV SOLN
10.0000 meq | INTRAVENOUS | Status: AC
  Administered 2012-12-26: 10 meq via INTRAVENOUS
  Filled 2012-12-26: qty 200

## 2012-12-26 MED ORDER — AMLODIPINE BESYLATE 10 MG PO TABS
10.0000 mg | ORAL_TABLET | Freq: Every day | ORAL | Status: DC
  Administered 2012-12-26 – 2013-01-07 (×13): 10 mg via ORAL
  Filled 2012-12-26 (×13): qty 1

## 2012-12-26 MED ORDER — NITROGLYCERIN 0.4 MG SL SUBL: 0.4000 mg | SUBLINGUAL_TABLET | SUBLINGUAL | Status: DC | PRN

## 2012-12-26 MED ORDER — SPIRONOLACTONE 25 MG PO TABS
25.0000 mg | ORAL_TABLET | Freq: Every day | ORAL | Status: DC
  Administered 2012-12-26 – 2013-01-03 (×9): 25 mg via ORAL
  Filled 2012-12-26 (×9): qty 1

## 2012-12-26 MED ORDER — LABETALOL HCL 5 MG/ML IV SOLN
10.0000 mg | INTRAVENOUS | Status: DC | PRN
  Administered 2012-12-26: 10 mg via INTRAVENOUS
  Filled 2012-12-26: qty 4

## 2012-12-26 MED ORDER — ENOXAPARIN SODIUM 40 MG/0.4ML ~~LOC~~ SOLN
40.0000 mg | SUBCUTANEOUS | Status: DC
  Administered 2012-12-26 – 2012-12-27 (×2): 40 mg via SUBCUTANEOUS
  Filled 2012-12-26 (×3): qty 0.4

## 2012-12-26 MED ORDER — SODIUM CHLORIDE 0.9 % IJ SOLN
3.0000 mL | Freq: Two times a day (BID) | INTRAMUSCULAR | Status: DC
  Administered 2012-12-26 – 2013-01-07 (×22): 3 mL via INTRAVENOUS

## 2012-12-26 MED ORDER — POTASSIUM CHLORIDE 10 MEQ/100ML IV SOLN: 10.0000 meq | INTRAVENOUS | Status: DC

## 2012-12-26 MED ORDER — ZOLPIDEM TARTRATE 10 MG PO TABS
10.0000 mg | ORAL_TABLET | Freq: Once | ORAL | Status: AC
  Administered 2012-12-26: 10 mg via ORAL
  Filled 2012-12-26: qty 1

## 2012-12-26 MED ORDER — FUROSEMIDE 10 MG/ML IJ SOLN
80.0000 mg | Freq: Two times a day (BID) | INTRAMUSCULAR | Status: DC
  Administered 2012-12-26 – 2013-01-02 (×13): 80 mg via INTRAVENOUS
  Filled 2012-12-26 (×17): qty 8

## 2012-12-26 MED ORDER — ALBUTEROL SULFATE (5 MG/ML) 0.5% IN NEBU
2.5000 mg | INHALATION_SOLUTION | RESPIRATORY_TRACT | Status: DC | PRN
  Administered 2012-12-29 – 2012-12-30 (×2): 2.5 mg via RESPIRATORY_TRACT
  Filled 2012-12-26 (×2): qty 0.5

## 2012-12-26 MED ORDER — HYDRALAZINE HCL 25 MG PO TABS
37.5000 mg | ORAL_TABLET | Freq: Two times a day (BID) | ORAL | Status: DC
  Administered 2012-12-26 – 2013-01-03 (×15): 37.5 mg via ORAL
  Filled 2012-12-26 (×18): qty 1.5

## 2012-12-26 MED ORDER — NITROGLYCERIN 2 % TD OINT
1.0000 [in_us] | TOPICAL_OINTMENT | Freq: Once | TRANSDERMAL | Status: AC
  Administered 2012-12-26: 1 [in_us] via TOPICAL
  Filled 2012-12-26: qty 30

## 2012-12-26 MED ORDER — ONDANSETRON HCL 4 MG PO TABS: 4.0000 mg | ORAL_TABLET | Freq: Three times a day (TID) | ORAL | Status: DC | PRN

## 2012-12-26 MED ORDER — ONDANSETRON HCL 4 MG/2ML IJ SOLN: 4.0000 mg | Freq: Four times a day (QID) | INTRAMUSCULAR | Status: DC | PRN

## 2012-12-26 NOTE — Progress Notes (Signed)
[facility] provided pt with a list of primary care resources in [facility] and a [facility] [employer]. Also, provided pt with information on the Affordable Care Act.
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
## 2012-12-26 NOTE — ED Notes (Addendum)
Pt presents with c/o CHF exacerbation. Pt reports swelling from the abdomen to the ankles. Pt reports last exacerbation was July 24, 2012. Pt reports shortness of breath with activity.  Pt has 2+ edema to bilateral lower extremities. Pt also reports nausea and emesis x2 this morning. Pt reports gaining 4 lbs in the last 24 hours. Pt is A/O x4 with an oxygen saturation of 98% on room air in triage.
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
## 2012-12-26 NOTE — Progress Notes (Signed)
Pt brought home CPAP machine in to wear tonight, Pt machine was inspected in April by [facility] but call has been placed for re-inspection, RT looked over machine and no frayed wires or cords noticed.  Pt stated that he doesn't need any help setting up machine.  RT to monitor and assess as needed.

## 2012-12-26 NOTE — ED Notes (Signed)
Critical I stat Troponin - Dr Jodi Mourning aware [phone].

## 2012-12-26 NOTE — ED Notes (Signed)
Cardio PA at bedside
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
## 2012-12-27 ENCOUNTER — Encounter (HOSPITAL_COMMUNITY): Payer: Self-pay

## 2012-12-27 DIAGNOSIS — N19 Unspecified kidney failure: Secondary | ICD-10-CM

## 2012-12-27 DIAGNOSIS — R0602 Shortness of breath: Secondary | ICD-10-CM

## 2012-12-27 DIAGNOSIS — I1310 Hypertensive heart and chronic kidney disease without heart failure, with stage 1 through stage 4 chronic kidney disease, or unspecified chronic kidney disease: Secondary | ICD-10-CM

## 2012-12-27 LAB — CBC
HCT: 31 % — ABNORMAL LOW (ref 39.0–52.0)
MCH: 21.8 pg — ABNORMAL LOW (ref 26.0–34.0)
MCV: 74.2 fL — ABNORMAL LOW (ref 78.0–100.0)
RBC: 4.18 MIL/uL — ABNORMAL LOW (ref 4.22–5.81)
WBC: 4.8 10*3/uL (ref 4.0–10.5)

## 2012-12-27 LAB — BASIC METABOLIC PANEL
CO2: 30 mEq/L (ref 19–32)
Chloride: 105 mEq/L (ref 96–112)
Glucose, Bld: 116 mg/dL — ABNORMAL HIGH (ref 70–99)
Potassium: 3 mEq/L — ABNORMAL LOW (ref 3.5–5.1)
Sodium: 142 mEq/L (ref 135–145)

## 2012-12-27 LAB — MAGNESIUM: Magnesium: 1.6 mg/dL (ref 1.5–2.5)

## 2012-12-27 MED ORDER — POTASSIUM CHLORIDE 10 MEQ/100ML IV SOLN
10.0000 meq | INTRAVENOUS | Status: AC
  Administered 2012-12-27 (×3): 10 meq via INTRAVENOUS
  Filled 2012-12-27 (×3): qty 100

## 2012-12-27 MED ORDER — MAGNESIUM SULFATE 40 MG/ML IJ SOLN
2.0000 g | Freq: Once | INTRAMUSCULAR | Status: AC
  Administered 2012-12-27: 16:00:00 2 g via INTRAVENOUS
  Filled 2012-12-27: qty 50

## 2012-12-27 MED ORDER — ZOLPIDEM TARTRATE 10 MG PO TABS
10.0000 mg | ORAL_TABLET | Freq: Once | ORAL | Status: AC
  Administered 2012-12-27: 10 mg via ORAL
  Filled 2012-12-27: qty 1

## 2012-12-27 NOTE — Progress Notes (Signed)
Pt placed himself on his home cpap machine with full face mask and is tolerating well at this time.

## 2012-12-27 NOTE — Progress Notes (Addendum)
[facility]  Subjective: Pt notes significant improvement in breathing. Nausea has improved with prn zofran.   Objective: Vital signs in last 24 hours: Temp:  [97.5 F (36.4 C)-98.5 F (36.9 C)] 97.6 F (36.4 C) (09/04 0800) Pulse Rate:  [81-114] 88 (09/04 0900) Resp:  [0-32] 18 (09/04 0800) BP: (106-173)/(68-121) 140/91 mmHg (09/04 0900) SpO2:  [93 %-100 %] 98 % (09/04 0900) Weight:  [289 lb 0.4 oz (131.1 kg)-291 lb 0.1 oz (132 kg)] 291 lb 0.1 oz (132 kg) (09/04 0400) Last BM Date: 12/26/12  Intake/Output from previous day: 09/03 0701 - 09/04 0700 In: 103 [I.V.:3; IV Piggyback:100] Out: 5830 [Urine:5830] Intake/Output this shift: Total I/O In: 720 [P.O.:720] Out: 400 [Urine:400]  Medications Current Facility-Administered Medications  Medication Dose Route Frequency Provider Last Rate Last Dose  . acetaminophen (TYLENOL) tablet 650 mg  650 mg Oral Q4H PRN Elease Etienne, MD      . albuterol (PROVENTIL) (5 MG/ML) 0.5% nebulizer solution 2.5 mg  2.5 mg Nebulization Q2H PRN Elease Etienne, MD      . amLODipine (NORVASC) tablet 10 mg  10 mg Oral Daily Brittainy Simmons, PA-C   10 mg at 12/27/12 0954  . aspirin EC tablet 81 mg  81 mg Oral Daily Elease Etienne, MD   81 mg at 12/27/12 0954  . carvedilol (COREG) tablet 25 mg  25 mg Oral BID WC Brittainy Simmons, PA-C   25 mg at 12/27/12 0954  . enoxaparin (LOVENOX) injection 40 mg  40 mg Subcutaneous Q24H Elease Etienne, MD   40 mg at 12/26/12 1933  . furosemide (LASIX) injection 80 mg  80 mg Intravenous BID Brittainy Simmons, PA-C   80 mg at 12/27/12 0954  . hydrALAZINE (APRESOLINE) tablet 37.5 mg  37.5 mg Oral BID Brittainy Simmons, PA-C   37.5 mg at 12/27/12 0953  . labetalol (NORMODYNE,TRANDATE) injection 10 mg  10 mg Intravenous Q2H PRN Elease Etienne, MD   10 mg at 12/26/12 1905  . magnesium sulfate IVPB 2 g 50 mL  2 g Intravenous Once Brittainy Simmons, PA-C      . multivitamin with  minerals tablet 1 tablet  1 tablet Oral Daily Elease Etienne, MD   1 tablet at 12/27/12 0955  . nitroGLYCERIN (NITROGLYN) 2 % ointment 1 inch  1 inch Topical Q6H Elease Etienne, MD   1 inch at 12/27/12 0704  . ondansetron (ZOFRAN) injection 4 mg  4 mg Intravenous Q6H PRN Elease Etienne, MD      . pantoprazole (PROTONIX) injection 40 mg  40 mg Intravenous Q24H Elease Etienne, MD   40 mg at 12/26/12 1947  . potassium chloride 10 mEq in 100 mL IVPB  10 mEq Intravenous Q1 Hr x 3 Roma Kayser Schorr, NP   10 mEq at 12/27/12 0953  . potassium chloride SA (K-DUR,KLOR-CON) CR tablet 20 mEq  20 mEq Oral Daily Brittainy Simmons, PA-C   20 mEq at 12/27/12 0953  . sodium chloride 0.9 % injection 3 mL  3 mL Intravenous Q12H Elease Etienne, MD   3 mL at 12/27/12 0956  . sodium chloride 0.9 % injection 3 mL  3 mL Intravenous PRN Elease Etienne, MD      . spironolactone (ALDACTONE) tablet 25 mg  25 mg Oral Daily Brittainy Simmons, PA-C   25 mg at 12/27/12 0981    PE: General appearance: alert, cooperative and no distress Lungs: + bilateral expiratory wheezing,  no rales Heart: regular rate and rhythm, Nl S1/S2 - distant, no M/R/G heard. Abd: obese, soft/NT -- edema; due to body habitus, I cannot tell if he haas ascites. Extremities: 1+ bilateral pretibial edema Pulses: 2+ and symmetric Skin: warm and dry Neurologic: Grossly normal  Lab Results:   Recent Labs  12/26/12 1200 12/27/12 0333  WBC 5.9 4.8  HGB 9.7* 9.1*  HCT 32.6* 31.0*  PLT 270 228   BMET  Recent Labs  12/26/12 1200 12/27/12 0333  NA 142 142  K 2.8* 3.0*  CL 106 105  CO2 25 30  GLUCOSE 143* 116*  BUN 17 19  CREATININE 2.09* 2.12*  CALCIUM 8.4 8.3*   Cardiac Panel (last 3 results)  Recent Labs  12/26/12 1833 12/27/12 0035 12/27/12 0630  TROPONINI <0.30 <0.30 <0.30   BNP (last 3 results)  Recent Labs  07/30/12 0825 12/26/12 1200 12/27/12 0333  PROBNP 1559.0* 3437.0* 3497.0*   Filed Weights    12/26/12 1900 12/27/12 0400  Weight: 289 lb 0.4 oz (131.1 kg) 291 lb 0.1 oz (132 kg)    Assessment/Plan   Principal Problem:   Acute on chronic combined systolic and diastolic congestive heart failure Active Problems:   Hypokalemia   H/O noncompliance with medical treatment, presenting hazards to health   Non-ischemic cardiomyopathy - EF 30-35%   OSA (obstructive sleep apnea) - supposed to be on CPAP   Accelerated essential hypertension   Anemia   CKD (chronic kidney disease), stage III   Nausea and vomiting  Plan: Pt reports subjective improvement in breathing. Pt has diuresed 5.4 L since admission yesterday. BNP is actually slightly increased today, as well as his weight. He weighed in at 289 lb yesterday and weighs in at 291 lbs today (dry weight ~260). Will continue to trend I/Os, daily weights and BNP to help guide treatment. ? If he needs fluid restriction. He is stilly hypokalemic w/ K+ of 3.0. He is receiving IV K+ and IV Mg. Will like to get K+ > 4 and Mg> 2.0. Will recheck both K and Mg this afternoon. MD to see later today.     LOS: 1 day    Brittainy M. Delmer Islam 12/27/2012 10:19 AM  I have seen and evaluated the patient this PM along with Boyce Medici, PA. I agree with her findings, examination as well as impression recommendations.  Brisk diuresis overnight of ~5 L -- for this reason, the weights make no sense whatsoever -- should be > 10 lb down based on I/O.  Further discussion would suggest that his fluid gain has been over ~1 month which correlates with ~30 lb above prior dry wgt.  Will need persistent diuresis - with IV lasix. Continue with current IV dosing for now. (His dry wgt may actually be closer to ~240 lb, since he claims to have lost wgt down to ~237lb.  Will need to closely monitor K+ & Mag (K is better,but still low this AM) -- labs recheck ordered for this PM. On Spironolactone  BP is much improved -- can switch back to PO Nitrate tomorrow AM;  will reassess to determine if Coreg can be increased (with his size 25 mg BID would be a low target - but 50 mg bid would be acceptable)  Nausea improved -- for now will not increase Hydralazine.   Renal function is holding steady at what seems to be his Basline. -- but need to consider high likelihood of CardioRenal Syndrome (high renal afterload) along with HTN  as a cause for his renal dysfunction (Cr was ~2.0 in April as well).  On PPI & antiemetics. Needs to be on CPAP qhs.  MD Time with pt: 15 min  Ashleyann Shoun W, M.D., M.S. [facility] [address] [place], [place]  [address]  [phone] Pager # [phone] 12/27/2012 10:52 AM

## 2012-12-27 NOTE — Progress Notes (Signed)
CARE MANAGEMENT NOTE 12/27/2012  Patient:  Devon Ortiz, Devon Ortiz   Account Number:  [id number]  Date Initiated:  12/27/2012  Documentation initiated by:  Darnell Jeschke  Subjective/Objective Assessment:   pt with hx of chronic and acute renal failure and chf now presents with increased pulmonary dyspnea and increased swelling of the extremities.     Action/Plan:   home with failure program and possibility of P.T. once evaluated   Anticipated DC Date:  12/30/2012   Anticipated DC Plan:  HOME W HOME HEALTH SERVICES  In-house referral  NA      DC Planning Services  CM consult      Choice offered to / List presented to:  C-1 Patient   DME arranged  NA      DME agency  NA     HH arranged  NA      HH agency  NA   Status of service:  In process, will continue to follow Medicare Important Message given?  NA - LOS <3 / Initial given by admissions (If response is "NO", the following Medicare IM given date fields will be blank) Date Medicare IM given:   Date Additional Medicare IM given:    Discharge Disposition:    Per UR Regulation:  Reviewed for med. necessity/level of care/duration of stay  If discussed at Long Length of Stay Meetings, dates discussed:    Comments:  [id number]/FAOZHY Stark Jock, BSN, [place] [phone] Chart Reviewed for discharge and hospital needs. Discharge needs at time of review:  Request for home heart failure program noted and list given to patient.  Did mark which program do have FH programs for his review and decision. Review of patient progress due on [id number].

## 2012-12-27 NOTE — Progress Notes (Signed)
Patient received from ICU.  Telemetry placed on patient on confirmed with CMT.  VSS, agree with previous RN's assessment of patient. Patient eating lunch at bedside.  Will continue to monitor patient for duration of shift.

## 2012-12-27 NOTE — Progress Notes (Signed)
[facility] PROGRESS NOTE  Devon Ortiz ZOX:[id number] DOB: May 02, 1965 DOA: 12/26/2012 PCP: Dorrene German, MD Primary Cardiologist: Dr. Bryan Lemma.  HPI/Brief narrative 47 y.o. male with PMH of morbid obesity, OSA on nightly CPAP, HTN, stage III chronic kidney disease with baseline creatinine of around 1.75, chronic combined CHF, recent echo on 07/25/12 showed EF 30-35% & grade 2 diastolic dysfunction, last cardiac cath 2012 demonstrated normal coronaries, and nonischemic cardiomyopathy presented to the ED on 12/26/12 with complaints of worsening body swelling, dyspnea, nausea and vomiting. Workup in ED reveals BNP 3437, chest x-ray suggestive of pulmonary edema and small right-sided effusion, POC troponin positive at 0.10 but subsequent lab troponin negative, EKG without acute changes, potassium 2.8, creatinine 2.09 and hemoglobin of 9.7. His initial BP was 173/117 mmHg. Cardiology consulted and patient was admitted to step down unit for management of decompensated CHF and accelerated hypertension.   Assessment/Plan:  Acute on chronic combined CHF/anasarca/nonischemic cardiomyopathy - Likely precipitated by inability to keep oral medications down from nausea and vomiting, accelerated hypertension & dietary indiscretion. - Echo 07/25/12 showed EF 30-35% & grade 2 diastolic dysfunction. - Last cardiac cath 2012 demonstrated normal coronaries. - Cardiology consulted and are following - Patient was started on IV Lasix and has started to improve. Continue IV Lasix. Diuresed 5.4 liters since admission. - No ACE inhibitor/ARB secondary to renal insufficiency. - Troponins negative and EKG without acute changes.  Accelerated HTN/hypertensive urgency - Likely secondary to inability to keep oral medications down from nausea and vomiting. - Much improved blood pressure control with oral medications overnight. - Continue carvedilol, hydralazine at reduced dose, Aldactone, Lasix &  amlodipine.  Hypokalemia - Secondary to diuresis. - Repleting orally and IV. Repleting magnesium. Follow BMP and magnesium later this afternoon and correct as needed.  Nausea and vomiting - Secondary to hydralazine versus congestive gastropathy. - Seems to have resolved. - Continue hydralazine at reduced dose and IV PPI.  Stage III chronic kidney disease - Creatinine slightly increased compared to admission. Monitor closely while on IV diuresis.  Microcytic anemia - Stable.  OSA - Continue nightly CPAP.    DVT prophylaxis: Lovenox Lines/catheters: PIV Nutrition: Heart healthy diet  Activity:  Up with assistance Code Status: Full Family Communication: None Disposition Plan: Transfer to telemetry. Home when medically stable.   Consultants:  Cardiology  Procedures:  None  Antibiotics:  None   Subjective: Dyspnea much improved. Coughing less this morning. Urinating a lot. No chest pain. Leg swellings unchanged.  Objective: Filed Vitals:   12/27/12 0600 12/27/12 0700 12/27/12 0800 12/27/12 0900  BP: 124/89 108/90 114/77 140/91  Pulse: 81 86 91 88  Temp:   97.6 F (36.4 C)   TempSrc:   Oral   Resp: 22 17 18    Height:      Weight:      SpO2: 97% 93% 95% 98%    Intake/Output Summary (Last 24 hours) at 12/27/12 1102 Last data filed at 12/27/12 0900  Gross per 24 hour  Intake    823 ml  Output   6230 ml  Net  -5407 ml   Filed Weights   12/26/12 1900 12/27/12 0400  Weight: 131.1 kg (289 lb 0.4 oz) 132 kg (291 lb 0.1 oz)     Exam:  General exam: Moderately built and morbidly obese male patient lying slightly propped up in bed and in no obvious distress. Respiratory system: Reduced breath sounds in the bases with occasional basal crackles but seems improved compared to admission. Rest of  lung fields are clear to auscultation. No increased work of breathing. Cardiovascular system: S1 & S2 heard, RRR. No JVD, murmurs, gallops, clicks. 3+ pitting bilateral  leg edema extending to anterior abdominal wall and sacral edema. Gastrointestinal system: Abdomen is distended, soft and nontender. Pitting anterior abdominal wall edema. Normal bowel sounds heard. Central nervous system: Alert and oriented. No focal neurological deficits. Extremities: Symmetric 5 x 5 power.   Data Reviewed: Basic Metabolic Panel:  Recent Labs Lab 12/26/12 1200 12/27/12 0333  NA 142 142  K 2.8* 3.0*  CL 106 105  CO2 25 30  GLUCOSE 143* 116*  BUN 17 19  CREATININE 2.09* 2.12*  CALCIUM 8.4 8.3*  MG 1.8  --    Liver Function Tests: No results found for this basename: AST, ALT, ALKPHOS, BILITOT, PROT, ALBUMIN,  in the last 168 hours No results found for this basename: LIPASE, AMYLASE,  in the last 168 hours No results found for this basename: AMMONIA,  in the last 168 hours CBC:  Recent Labs Lab 12/26/12 1200 12/27/12 0333  WBC 5.9 4.8  HGB 9.7* 9.1*  HCT 32.6* 31.0*  MCV 73.3* 74.2*  PLT 270 228   Cardiac Enzymes:  Recent Labs Lab 12/26/12 1200 12/26/12 1833 12/27/12 0035 12/27/12 0630  TROPONINI <0.30 <0.30 <0.30 <0.30   BNP (last 3 results)  Recent Labs  07/30/12 0825 12/26/12 1200 12/27/12 0333  PROBNP 1559.0* 3437.0* 3497.0*   CBG: No results found for this basename: GLUCAP,  in the last 168 hours  Recent Results (from the past 240 hour(s))  MRSA PCR SCREENING     Status: None   Collection Time    12/26/12  7:00 PM      Result Value Range Status   MRSA by PCR NEGATIVE  NEGATIVE Final   Comment:            The GeneXpert MRSA Assay (FDA     approved for NASAL specimens     only), is one component of a     comprehensive MRSA colonization     surveillance program. It is not     intended to diagnose MRSA     infection nor to guide or     monitor treatment for     MRSA infections.      Additional labs: 1. None     Studies: Dg Chest 2 View  12/26/2012   *RADIOLOGY REPORT*  Clinical Data: Shortness of breath, pulmonary  edema  CHEST - 2 VIEW  Comparison: 07/26/2012; 07/24/2012; 03/11/2012  Findings:  Grossly unchanged enlarged cardiac silhouette and mediastinal contours.  The pulmonary vasculature is indistinct with cephalization of flow. Improved aeration of the left lung base with persistent symmetric bibasilar heterogeneous opacities favored to represent atelectasis.  Small right-sided pleural effusion.  No pneumothorax.  Unchanged bones.  IMPRESSION: 1.  Cardiomegaly with pulmonary edema, small right-sided effusion and bibasilar atelectasis.  2.  Improved aeration of the left lower lung.   Original Report Authenticated By: Tacey Ruiz, MD        Scheduled Meds: . amLODipine  10 mg Oral Daily  . aspirin EC  81 mg Oral Daily  . carvedilol  25 mg Oral BID WC  . enoxaparin (LOVENOX) injection  40 mg Subcutaneous Q24H  . furosemide  80 mg Intravenous BID  . hydrALAZINE  37.5 mg Oral BID  . magnesium sulfate 1 - 4 g bolus IVPB  2 g Intravenous Once  . multivitamin with minerals  1 tablet  Oral Daily  . nitroGLYCERIN  1 inch Topical Q6H  . pantoprazole (PROTONIX) IV  40 mg Intravenous Q24H  . potassium chloride SA  20 mEq Oral Daily  . sodium chloride  3 mL Intravenous Q12H  . spironolactone  25 mg Oral Daily   Continuous Infusions:   Principal Problem:   Acute on chronic combined systolic and diastolic congestive heart failure Active Problems:   Hypokalemia   H/O noncompliance with medical treatment, presenting hazards to health   Non-ischemic cardiomyopathy - EF 30-35%   OSA (obstructive sleep apnea) - supposed to be on CPAP   Accelerated essential hypertension   Anemia   CKD (chronic kidney disease), stage III   Nausea and vomiting    Time spent: 35 minutes    [facility]  [facility] Pager [phone].   If 8PM-8AM, please contact night-coverage at [email or web address], password [facility] 12/27/2012, 11:02 AM  LOS: 1 day

## 2012-12-28 DIAGNOSIS — N183 Chronic kidney disease, stage 3 unspecified: Secondary | ICD-10-CM

## 2012-12-28 LAB — CBC
MCH: 22.2 pg — ABNORMAL LOW (ref 26.0–34.0)
MCHC: 30.4 g/dL (ref 30.0–36.0)
MCV: 72.9 fL — ABNORMAL LOW (ref 78.0–100.0)
Platelets: 255 10*3/uL (ref 150–400)
RBC: 4.1 MIL/uL — ABNORMAL LOW (ref 4.22–5.81)
WBC: 4.7 10*3/uL (ref 4.0–10.5)

## 2012-12-28 LAB — BASIC METABOLIC PANEL
CO2: 30 mEq/L (ref 19–32)
Calcium: 8.4 mg/dL (ref 8.4–10.5)
Creatinine, Ser: 2.22 mg/dL — ABNORMAL HIGH (ref 0.50–1.35)
GFR calc non Af Amer: 34 mL/min — ABNORMAL LOW (ref >90)
Sodium: 137 mEq/L (ref 135–145)

## 2012-12-28 MED ORDER — ZOLPIDEM TARTRATE 5 MG PO TABS
10.0000 mg | ORAL_TABLET | Freq: Every evening | ORAL | Status: DC | PRN
  Administered 2012-12-28 – 2013-01-06 (×10): 10 mg via ORAL
  Filled 2012-12-28: qty 1
  Filled 2012-12-28: qty 2
  Filled 2012-12-28: qty 1
  Filled 2012-12-28 (×2): qty 2
  Filled 2012-12-28: qty 1
  Filled 2012-12-28 (×2): qty 2
  Filled 2012-12-28 (×2): qty 1

## 2012-12-28 MED ORDER — POTASSIUM CHLORIDE CRYS ER 20 MEQ PO TBCR
40.0000 meq | EXTENDED_RELEASE_TABLET | Freq: Three times a day (TID) | ORAL | Status: DC
  Administered 2012-12-28 – 2013-01-03 (×18): 40 meq via ORAL
  Filled 2012-12-28 (×21): qty 2

## 2012-12-28 MED ORDER — ISOSORBIDE MONONITRATE ER 30 MG PO TB24
30.0000 mg | ORAL_TABLET | Freq: Every day | ORAL | Status: DC
  Administered 2012-12-28 – 2013-01-07 (×11): 30 mg via ORAL
  Filled 2012-12-28 (×11): qty 1

## 2012-12-28 MED ORDER — ENOXAPARIN SODIUM 60 MG/0.6ML ~~LOC~~ SOLN
60.0000 mg | SUBCUTANEOUS | Status: DC
  Administered 2012-12-28 – 2013-01-06 (×10): 60 mg via SUBCUTANEOUS
  Filled 2012-12-28 (×12): qty 0.6

## 2012-12-28 MED ORDER — POTASSIUM CHLORIDE 10 MEQ/100ML IV SOLN
10.0000 meq | INTRAVENOUS | Status: AC
  Administered 2012-12-28 (×2): 10 meq via INTRAVENOUS
  Filled 2012-12-28 (×2): qty 100

## 2012-12-28 MED ORDER — POTASSIUM CHLORIDE CRYS ER 20 MEQ PO TBCR
40.0000 meq | EXTENDED_RELEASE_TABLET | Freq: Once | ORAL | Status: AC
  Administered 2012-12-28: 40 meq via ORAL
  Filled 2012-12-28: qty 2

## 2012-12-28 MED ORDER — PANTOPRAZOLE SODIUM 40 MG PO TBEC
40.0000 mg | DELAYED_RELEASE_TABLET | Freq: Every day | ORAL | Status: DC
  Administered 2012-12-28 – 2013-01-07 (×11): 40 mg via ORAL
  Filled 2012-12-28 (×10): qty 1

## 2012-12-28 MED ORDER — POTASSIUM CHLORIDE CRYS ER 20 MEQ PO TBCR
40.0000 meq | EXTENDED_RELEASE_TABLET | Freq: Every day | ORAL | Status: DC
  Administered 2012-12-28: 40 meq via ORAL
  Filled 2012-12-28: qty 2

## 2012-12-28 NOTE — Progress Notes (Signed)
[facility] PROGRESS NOTE  Devon Ortiz ZOX:[id number] DOB: [birth year] DOA: 12/26/2012 PCP: Dorrene German, MD Primary Cardiologist: Dr. Bryan Lemma.  HPI/Brief narrative 47 y.o. male with PMH of morbid obesity, OSA on nightly CPAP, HTN, stage III chronic kidney disease with baseline creatinine of around 1.75, chronic combined CHF, recent echo on 07/25/12 showed EF 30-35% & grade 2 diastolic dysfunction, last cardiac cath 2012 demonstrated normal coronaries, and nonischemic cardiomyopathy presented to the ED on 12/26/12 with complaints of worsening body swelling, dyspnea, nausea and vomiting. Workup in ED reveals BNP 3437, chest x-ray suggestive of pulmonary edema and small right-sided effusion, POC troponin positive at 0.10 but subsequent lab troponin negative, EKG without acute changes, potassium 2.8, creatinine 2.09 and hemoglobin of 9.7. His initial BP was 173/117 mmHg. Cardiology consulted and patient was admitted to step down unit for management of decompensated CHF and accelerated hypertension.   Assessment/Plan:  Acute on chronic combined CHF/anasarca/nonischemic cardiomyopathy - Likely precipitated by inability to keep oral medications down from nausea and vomiting, accelerated hypertension & dietary indiscretion. - Echo 07/25/12 showed EF 30-35% & grade 2 diastolic dysfunction. - Last cardiac cath 2012 demonstrated normal coronaries. - Cardiology consulted and are following - Patient was started on IV Lasix and has started to improve. Continue IV Lasix. Diuresed 7.7 liters since admission. - No ACE inhibitor/ARB secondary to renal insufficiency. - Troponins negative and EKG without acute changes. - Slowly improving. Continue IV diuresis for now.  Accelerated HTN/hypertensive urgency - Likely secondary to inability to keep oral medications down from nausea and vomiting. - Continue carvedilol, hydralazine at reduced dose, Aldactone, Lasix & amlodipine. -  Controlled.  Hypokalemia - Secondary to diuresis. - Increase oral potassium supplements. Magnesium 1.6. Follow BMP.  Nausea and vomiting - Secondary to hydralazine versus congestive gastropathy. - Seems to have resolved. - Continue hydralazine at reduced dose and IV PPI-change to by mouth.  Stage III chronic kidney disease - Creatinine slightly increased compared to admission. Monitor closely while on IV diuresis. - ? Cardiorenal syndrome.  Microcytic anemia - Stable.  OSA - Continue nightly CPAP.    DVT prophylaxis: Lovenox Lines/catheters: PIV Nutrition: Heart healthy diet  Activity:  Up with assistance Code Status: Full Family Communication: None Disposition Plan: Home when medically stable.   Consultants:  Cardiology  Procedures:  None  Antibiotics:  None   Subjective: States had slightly more dyspnea early this morning. No chest pain. Leg swellings do not seem to have changed according to him. Intermittent mild cough.  Objective: Filed Vitals:   12/28/12 0038 12/28/12 0548 12/28/12 0735 12/28/12 1414  BP: 121/75 114/76 127/82 92/65  Pulse: 79 75  82  Temp:  97.3 F (36.3 C)  97.5 F (36.4 C)  TempSrc:  Oral  Oral  Resp:  18  20  Height:      Weight:  130.1 kg (286 lb 13.1 oz)    SpO2:  97%  98%    Intake/Output Summary (Last 24 hours) at 12/28/12 1451 Last data filed at 12/28/12 1300  Gross per 24 hour  Intake    720 ml  Output   2900 ml  Net  -2180 ml   Filed Weights   12/27/12 0400 12/27/12 1511 12/28/12 0548  Weight: 132 kg (291 lb 0.1 oz) 133.6 kg (294 lb 8.6 oz) 130.1 kg (286 lb 13.1 oz)     Exam:  General exam: Moderately built and morbidly obese male patient lying slightly propped up in bed with mild increased work of  breathing. Respiratory system: Reduced breath sounds in the bases with occasional basal crackles but seems improved compared to admission. Rest of lung fields are clear to auscultation. Mild increased work of  breathing. Cardiovascular system: S1 & S2 heard, RRR. No JVD, murmurs, gallops, clicks. 3+ pitting bilateral leg edema extending to anterior abdominal wall and sacral edema. Telemetry: Sinus rhythm. Gastrointestinal system: Abdomen is distended, soft and nontender. Pitting anterior abdominal wall edema-decreasing. Normal bowel sounds heard. Central nervous system: Alert and oriented. No focal neurological deficits. Extremities: Symmetric 5 x 5 power.   Data Reviewed: Basic Metabolic Panel:  Recent Labs Lab 12/26/12 1200 12/27/12 0333 12/27/12 1406 12/28/12 0355  NA 142 142  --  137  K 2.8* 3.0* 3.4* 2.7*  CL 106 105  --  100  CO2 25 30  --  30  GLUCOSE 143* 116*  --  114*  BUN 17 19  --  19  CREATININE 2.09* 2.12*  --  2.22*  CALCIUM 8.4 8.3*  --  8.4  MG 1.8  --  1.6  --    Liver Function Tests: No results found for this basename: AST, ALT, ALKPHOS, BILITOT, PROT, ALBUMIN,  in the last 168 hours No results found for this basename: LIPASE, AMYLASE,  in the last 168 hours No results found for this basename: AMMONIA,  in the last 168 hours CBC:  Recent Labs Lab 12/26/12 1200 12/27/12 0333 12/28/12 0355  WBC 5.9 4.8 4.7  HGB 9.7* 9.1* 9.1*  HCT 32.6* 31.0* 29.9*  MCV 73.3* 74.2* 72.9*  PLT 270 228 255   Cardiac Enzymes:  Recent Labs Lab 12/26/12 1200 12/26/12 1833 12/27/12 0035 12/27/12 0630  TROPONINI <0.30 <0.30 <0.30 <0.30   BNP (last 3 results)  Recent Labs  07/30/12 0825 12/26/12 1200 12/27/12 0333  PROBNP 1559.0* 3437.0* 3497.0*   CBG: No results found for this basename: GLUCAP,  in the last 168 hours  Recent Results (from the past 240 hour(s))  MRSA PCR SCREENING     Status: None   Collection Time    12/26/12  7:00 PM      Result Value Range Status   MRSA by PCR NEGATIVE  NEGATIVE Final   Comment:            The GeneXpert MRSA Assay (FDA     approved for NASAL specimens     only), is one component of a     comprehensive MRSA colonization      surveillance program. It is not     intended to diagnose MRSA     infection nor to guide or     monitor treatment for     MRSA infections.      Additional labs: 1. None     Studies: No results found.      Scheduled Meds: . amLODipine  10 mg Oral Daily  . aspirin EC  81 mg Oral Daily  . carvedilol  25 mg Oral BID WC  . enoxaparin (LOVENOX) injection  60 mg Subcutaneous Q24H  . furosemide  80 mg Intravenous BID  . hydrALAZINE  37.5 mg Oral BID  . isosorbide mononitrate  30 mg Oral Daily  . multivitamin with minerals  1 tablet Oral Daily  . pantoprazole  40 mg Oral Daily  . potassium chloride SA  40 mEq Oral TID  . sodium chloride  3 mL Intravenous Q12H  . spironolactone  25 mg Oral Daily   Continuous Infusions:   Principal Problem:  Acute on chronic combined systolic and diastolic congestive heart failure Active Problems:   Hypokalemia   H/O noncompliance with medical treatment, presenting hazards to health   Non-ischemic cardiomyopathy - EF 30-35%   OSA (obstructive sleep apnea) - supposed to be on CPAP   Accelerated essential hypertension   Anemia   CKD (chronic kidney disease), stage III   Nausea and vomiting   Cardiorenal syndrome with renal failure    Time spent: 25 minutes    [facility]  [facility] Pager [phone].   If 8PM-8AM, please contact night-coverage at [email or web address], password [facility] 12/28/2012, 2:51 PM  LOS: 2 days

## 2012-12-28 NOTE — Progress Notes (Signed)
Pt places self on and off home cpap with a full face mask. RT made pt aware that if he had any questions or problems to please call.

## 2012-12-28 NOTE — Progress Notes (Addendum)
[facility]  Subjective: Pt notes significant improvement in breathing - but still volume overloaded.   Nausea has improved with prn zofran.   Objective: Vital signs in last 24 hours: Temp:  [97.3 F (36.3 C)-98.2 F (36.8 C)] 97.3 F (36.3 C) (09/05 0548) Pulse Rate:  [75-156] 75 (09/05 0548) Resp:  [14-18] 18 (09/05 0548) BP: (99-127)/(70-83) 127/82 mmHg (09/05 0735) SpO2:  [94 %-100 %] 97 % (09/05 0548) Weight:  [286 lb 13.1 oz (130.1 kg)-294 lb 8.6 oz (133.6 kg)] 286 lb 13.1 oz (130.1 kg) (09/05 0548) Last BM Date: 12/28/12  Intake/Output from previous day: 09/04 0701 - 09/05 0700 In: 1260 [P.O.:960] Out: 2700 [Urine:2700] Intake/Output this shift: Total I/O In: 240 [P.O.:240] Out: 1000 [Urine:1000]  Medications Current Facility-Administered Medications  Medication Dose Route Frequency Provider Last Rate Last Dose  . acetaminophen (TYLENOL) tablet 650 mg  650 mg Oral Q4H PRN Elease Etienne, MD      . albuterol (PROVENTIL) (5 MG/ML) 0.5% nebulizer solution 2.5 mg  2.5 mg Nebulization Q2H PRN Elease Etienne, MD      . amLODipine (NORVASC) tablet 10 mg  10 mg Oral Daily Brittainy Simmons, PA-C   10 mg at 12/28/12 1001  . aspirin EC tablet 81 mg  81 mg Oral Daily Elease Etienne, MD   81 mg at 12/28/12 1001  . carvedilol (COREG) tablet 25 mg  25 mg Oral BID WC Brittainy Simmons, PA-C   25 mg at 12/28/12 0735  . enoxaparin (LOVENOX) injection 40 mg  40 mg Subcutaneous Q24H Elease Etienne, MD   40 mg at 12/27/12 2109  . furosemide (LASIX) injection 80 mg  80 mg Intravenous BID Brittainy Simmons, PA-C   80 mg at 12/28/12 0735  . hydrALAZINE (APRESOLINE) tablet 37.5 mg  37.5 mg Oral BID Brittainy Simmons, PA-C   37.5 mg at 12/28/12 1000  . isosorbide mononitrate (IMDUR) 24 hr tablet 30 mg  30 mg Oral Daily Elease Etienne, MD   30 mg at 12/28/12 1223  . labetalol (NORMODYNE,TRANDATE) injection 10 mg  10 mg Intravenous Q2H PRN Elease Etienne, MD   10 mg at 12/26/12 1905  . multivitamin with minerals tablet 1 tablet  1 tablet Oral Daily Elease Etienne, MD   1 tablet at 12/28/12 1001  . ondansetron (ZOFRAN) injection 4 mg  4 mg Intravenous Q6H PRN Elease Etienne, MD      . pantoprazole (PROTONIX) injection 40 mg  40 mg Intravenous Q24H Elease Etienne, MD   40 mg at 12/27/12 2109  . potassium chloride SA (K-DUR,KLOR-CON) CR tablet 40 mEq  40 mEq Oral TID Marykay Lex, MD      . sodium chloride 0.9 % injection 3 mL  3 mL Intravenous Q12H Elease Etienne, MD   3 mL at 12/28/12 1000  . sodium chloride 0.9 % injection 3 mL  3 mL Intravenous PRN Elease Etienne, MD      . spironolactone (ALDACTONE) tablet 25 mg  25 mg Oral Daily Brittainy Simmons, PA-C   25 mg at 12/28/12 1001    PE: General appearance: alert, cooperative and no distress Lungs: + bilateral expiratory wheezing, no rales Heart: regular rate and rhythm, Nl S1/S2 - distant, no M/R/G heard. Abd: obese, soft/NT -- edema; due to body habitus, I cannot tell if he haas ascites. Extremities: 1+ bilateral pretibial edema Pulses: 2+ and symmetric Skin: warm and dry Neurologic: Grossly normal  Lab Results:   Recent Labs  12/26/12 1200 12/27/12 0333 12/28/12 0355  WBC 5.9 4.8 4.7  HGB 9.7* 9.1* 9.1*  HCT 32.6* 31.0* 29.9*  PLT 270 228 255   BMET  Recent Labs  12/26/12 1200 12/27/12 0333 12/27/12 1406 12/28/12 0355  NA 142 142  --  137  K 2.8* 3.0* 3.4* 2.7*  CL 106 105  --  100  CO2 25 30  --  30  GLUCOSE 143* 116*  --  114*  BUN 17 19  --  19  CREATININE 2.09* 2.12*  --  2.22*  CALCIUM 8.4 8.3*  --  8.4   Cardiac Panel (last 3 results)  Recent Labs  12/26/12 1833 12/27/12 0035 12/27/12 0630  TROPONINI <0.30 <0.30 <0.30   BNP (last 3 results)  Recent Labs  07/30/12 0825 12/26/12 1200 12/27/12 0333  PROBNP 1559.0* 3437.0* 3497.0*   Filed Weights   12/27/12 0400 12/27/12 1511 12/28/12 0548  Weight: 291 lb 0.1 oz (132 kg)  294 lb 8.6 oz (133.6 kg) 286 lb 13.1 oz (130.1 kg)    Assessment/Plan   Principal Problem:   Acute on chronic combined systolic and diastolic congestive heart failure Active Problems:   H/O noncompliance with medical treatment, presenting hazards to health   Non-ischemic cardiomyopathy - EF 30-35%   Accelerated essential hypertension   Hypokalemia   OSA (obstructive sleep apnea) - supposed to be on CPAP   Cardiorenal syndrome with renal failure   Anemia   CKD (chronic kidney disease), stage III   Nausea and vomiting    LOS: 2 days   Brisk diuresis~8L out in total -- should correlate with more significant wgt loss -- ~17 lb (8L x 2.2 lb = ~17.4 lb) Further discussion would suggest that his fluid gain has been over ~1 month which correlates with ~30 lb above prior dry wgt.  Will need persistent diuresis - with IV lasix. Continue with current IV dosing for now. (His dry wgt may actually be closer to ~240 lb, since he claims to have lost wgt down to ~237lb. Would like to get to ~260 lb.  Will need to closely monitor K+ & Mag (K is better,but still low this AM) -- labs recheck ordered for this PM. On Spironolactone but with renal function ~Cr of 2.2, cannot increase spironolactone.  May actually do better with lasix gtt, but will keep boluses for now.  With such difficulty keepin K > 3.5-4.0, I am reluctant to push his diuresis any further -- with low EF he is already @ increased risk of arrythmia -- HypoKalemia could be dangerous.  K 2.7 today -- have increased PO supplement to 40 mEq tid (trying to hold off on further IV repletion)  BP is much improved!! -- can switch back to PO Nitrate today; will keep current Coreg dosing @ 25 mg bid.  Nausea improved -- for now will not increase Hydralazine -- using Hydralazine/Nitrate in lieu of ARB/ACE-I due to CKD IIIb / cardiorenal syndrome  Renal function is holding steady at what seems to be his Basline. -- but need to consider high likelihood  of CardioRenal Syndrome (high renal afterload) along with HTN as a cause for his renal dysfunction (Cr was ~2.0 in April as well).  Not sure how helpful proBNP will be - can recheck once he is closer to d/c weight.  On PPI & antiemetics. Needs to be on CPAP qhs.  MD Time with pt: 15 min  Joakim Huesman W, M.D., M.S.  [facility] [address] [place], [place]  [address]  [phone] Pager # [phone] 12/28/2012 12:37 PM

## 2012-12-28 NOTE — Progress Notes (Signed)
CRITICAL VALUE ALERT  Critical value received:  K 2.7  Date of notification:  12/28/2012  Time of notification:  0506 AM  Critical value read back:yes  Nurse who received alert:  Silvestre Gunner RN  MD notified (1st page):  Toya Smothers  Time of first page:  0508 AM  MD notified (2nd page):  Time of second page:  Responding MD:  Toya Smothers  Time MD responded:  [phone] AM

## 2012-12-29 DIAGNOSIS — R0989 Other specified symptoms and signs involving the circulatory and respiratory systems: Secondary | ICD-10-CM

## 2012-12-29 DIAGNOSIS — Z9889 Other specified postprocedural states: Secondary | ICD-10-CM

## 2012-12-29 DIAGNOSIS — G4733 Obstructive sleep apnea (adult) (pediatric): Secondary | ICD-10-CM

## 2012-12-29 DIAGNOSIS — N289 Disorder of kidney and ureter, unspecified: Secondary | ICD-10-CM

## 2012-12-29 DIAGNOSIS — N189 Chronic kidney disease, unspecified: Secondary | ICD-10-CM

## 2012-12-29 DIAGNOSIS — R0609 Other forms of dyspnea: Secondary | ICD-10-CM

## 2012-12-29 LAB — BASIC METABOLIC PANEL
BUN: 20 mg/dL (ref 6–23)
Calcium: 8.6 mg/dL (ref 8.4–10.5)
GFR calc Af Amer: 39 mL/min — ABNORMAL LOW (ref >90)
GFR calc non Af Amer: 34 mL/min — ABNORMAL LOW (ref >90)
Glucose, Bld: 110 mg/dL — ABNORMAL HIGH (ref 70–99)
Sodium: 137 mEq/L (ref 135–145)

## 2012-12-29 NOTE — Progress Notes (Signed)
Spoke with pt in regards to nighttime CPAP, pt has unit from home at bedside-- prefers to self administer when ready. Verbalizes understanding RT available for any additional assistance needed.

## 2012-12-29 NOTE — Progress Notes (Signed)
Subjective: Up to BR, SOB during the night, neb helped. Admits to dietary indiscretion.  Objective: Vital signs in last 24 hours: Temp:  [97.5 F (36.4 C)-98.1 F (36.7 C)] 98.1 F (36.7 C) (09/06 0507) Pulse Rate:  [82-88] 82 (09/06 0522) Resp:  [16-20] 18 (09/06 0522) BP: (92-119)/(65-84) 117/84 mmHg (09/06 0507) SpO2:  [98 %-100 %] 98 % (09/06 0522) Weight:  [285 lb 7.9 oz (129.5 kg)] 285 lb 7.9 oz (129.5 kg) (09/06 0500) Weight change: -9 lb 0.6 oz (-4.1 kg) Last BM Date: 12/28/12 Intake/Output from previous day: -2295 (since admit -9462)   Wt 285.7 down from pk 294,  09/05 0701 - 09/06 0700 In: 480 [P.O.:480] Out: 2775 [Urine:2775] Intake/Output this shift: Total I/O In: 240 [P.O.:240] Out: 500 [Urine:500]  PE: General:Pleasant affect, NAD Skin:Warm and dry, brisk capillary refill HEENT:normocephalic, sclera clear, mucus membranes moist Neck:supple, no JVD, no bruits  Heart:S1S2 RRR without murmur, gallup, rub or click Lungs:clear without rales, rhonchi, or wheezes XBM:WUXL, non tender, + BS, do not palpate liver spleen or masses Ext:+ 2 lower ext edema, Neuro:alert and oriented, MAE, follows commands, + facial symmetry   Lab Results:  Recent Labs  12/27/12 0333 12/28/12 0355  WBC 4.8 4.7  HGB 9.1* 9.1*  HCT 31.0* 29.9*  PLT 228 255   BMET  Recent Labs  12/28/12 0355 12/29/12 0415  NA 137 137  K 2.7* 3.3*  CL 100 101  CO2 30 29  GLUCOSE 114* 110*  BUN 19 20  CREATININE 2.22* 2.24*  CALCIUM 8.4 8.6    Recent Labs  12/27/12 0035 12/27/12 0630  TROPONINI <0.30 <0.30    Lab Results  Component Value Date   CHOL 134 03/10/2012   HDL 33* 03/10/2012   LDLCALC 84 03/10/2012   TRIG 83 03/10/2012   CHOLHDL 4.1 03/10/2012   Lab Results  Component Value Date   HGBA1C 6.0* 03/09/2012     Lab Results  Component Value Date   TSH 0.673 07/24/2012      Studies/Results: No results found.  Medications: I have reviewed the patient's  current medications. Scheduled Meds: . amLODipine  10 mg Oral Daily  . aspirin EC  81 mg Oral Daily  . carvedilol  25 mg Oral BID WC  . enoxaparin (LOVENOX) injection  60 mg Subcutaneous Q24H  . furosemide  80 mg Intravenous BID  . hydrALAZINE  37.5 mg Oral BID  . isosorbide mononitrate  30 mg Oral Daily  . multivitamin with minerals  1 tablet Oral Daily  . pantoprazole  40 mg Oral Daily  . potassium chloride SA  40 mEq Oral TID  . sodium chloride  3 mL Intravenous Q12H  . spironolactone  25 mg Oral Daily   Continuous Infusions:  PRN Meds:.acetaminophen, albuterol, labetalol, ondansetron (ZOFRAN) IV, sodium chloride, zolpidem  Assessment/Plan: Principal Problem:   Acute on chronic combined systolic and diastolic congestive heart failure Active Problems:   Hypokalemia   H/O noncompliance with medical treatment, presenting hazards to health   Non-ischemic cardiomyopathy - EF 30-35%   OSA (obstructive sleep apnea) - supposed to be on CPAP   Accelerated essential hypertension   Anemia   CKD (chronic kidney disease), stage III   Nausea and vomiting   Cardiorenal syndrome with renal failure  Plan: continues to diuresis, Hypokalemia continues on supplementation and spironolactone.  Mag level 1.6 Replaced with IV mag Recheck Pro bnp tomorrow and Mg+.   Hx of cath 2012 with no CAD.  LOS: 3 days   Time spent with pt. :15 minutes. [facility] R  Nurse Practitioner Certified Pager [phone] 12/29/2012, 12:08 PM  Pt admitted with volume overload. Diuresing with IV lasix.Almost 10 liters neg.  Getting K repleted. Still has 2+ pitting edema. Admits to dietary indiscretion. Still has further to go before D/C home.  Agree with note written by Nada Boozer RNP Runell Gess 12/29/2012 12:48 PM

## 2012-12-29 NOTE — Progress Notes (Signed)
[facility] PROGRESS NOTE  Devon Ortiz BJY:[id number] DOB: 11/12/1965 DOA: 12/26/2012 PCP: Dorrene German, MD Primary Cardiologist: Dr. Bryan Lemma.  HPI/Brief narrative 47 y.o. male with PMH of morbid obesity, OSA on nightly CPAP, HTN, stage III chronic kidney disease with baseline creatinine of around 1.75, chronic combined CHF, recent echo on 07/25/12 showed EF 30-35% & grade 2 diastolic dysfunction, last cardiac cath 2012 demonstrated normal coronaries, and nonischemic cardiomyopathy presented to the ED on 12/26/12 with complaints of worsening body swelling, dyspnea, nausea and vomiting. Workup in ED reveals BNP 3437, chest x-ray suggestive of pulmonary edema and small right-sided effusion, POC troponin positive at 0.10 but subsequent lab troponin negative, EKG without acute changes, potassium 2.8, creatinine 2.09 and hemoglobin of 9.7. His initial BP was 173/117 mmHg. Cardiology consulted and patient was admitted to step down unit for management of decompensated CHF and accelerated hypertension.   Assessment/Plan:  Acute on chronic combined CHF/anasarca/nonischemic cardiomyopathy - Likely precipitated by inability to keep oral medications down from nausea and vomiting, accelerated hypertension & dietary indiscretion. - Echo 07/25/12 showed EF 30-35% & grade 2 diastolic dysfunction. - Last cardiac cath 2012 demonstrated normal coronaries. - Cardiology consulted and are following - Patient was started on IV Lasix and has started to improve. Continue IV Lasix. Diuresed 9.7 liters since admission. - No ACE inhibitor/ARB secondary to renal insufficiency. - Troponins negative and EKG without acute changes. - Slowly improving. Continue IV diuresis for now.  Accelerated HTN/hypertensive urgency - Likely secondary to inability to keep oral medications down from nausea and vomiting. - Continue carvedilol, hydralazine at reduced dose, Aldactone, Lasix & amlodipine. -  Controlled.  Hypokalemia - Secondary to diuresis. - Improving. Potassium increased to 40 meq 3 times a day yesterday.  Nausea and vomiting - Secondary to hydralazine versus congestive gastropathy. - Seems to have resolved. - Continue hydralazine at reduced dose and IV PPI-change to by mouth.  Stage III chronic kidney disease - Creatinine slightly increased compared to admission. Monitor closely while on IV diuresis. - ? Cardiorenal syndrome.  Microcytic anemia - Stable.  OSA - Continue nightly CPAP.   DVT prophylaxis: Lovenox Lines/catheters: PIV Nutrition: Heart healthy diet  Activity:  Up with assistance Code Status: Full Family Communication: None Disposition Plan: Home when medically stable.   Consultants:  Cardiology  Procedures:  None  Antibiotics:  None   Subjective: Still has intermittent dyspnea-? Orthopnea last night. Leg swellings slowly decreasing.  Objective: Filed Vitals:   12/28/12 2111 12/29/12 0500 12/29/12 0507 12/29/12 0522  BP: 119/79  117/84   Pulse: 88  86 82  Temp: 98.1 F (36.7 C)  98.1 F (36.7 C)   TempSrc: Oral  Oral   Resp: 16  18 18   Height:      Weight:  129.5 kg (285 lb 7.9 oz)    SpO2: 99%  100% 98%    Intake/Output Summary (Last 24 hours) at 12/29/12 1017 Last data filed at 12/29/12 0925  Gross per 24 hour  Intake    480 ml  Output   2275 ml  Net  -1795 ml   Filed Weights   12/27/12 1511 12/28/12 0548 12/29/12 0500  Weight: 133.6 kg (294 lb 8.6 oz) 130.1 kg (286 lb 13.1 oz) 129.5 kg (285 lb 7.9 oz)     Exam:  General exam: Moderately built and morbidly obese male patient lying comfortably in bed. Respiratory system: Seems clear to auscultation. No increased work of breathing. Cardiovascular system: S1 & S2 heard, RRR. No  JVD, murmurs, gallops, clicks. 3+ pitting bilateral leg edema extending to anterior abdominal wall and sacral edema-slowly decreasing. Telemetry: Sinus rhythm. Gastrointestinal system:  Abdomen is distended, soft and nontender. Pitting anterior abdominal wall edema-decreasing. Normal bowel sounds heard. Central nervous system: Alert and oriented. No focal neurological deficits. Extremities: Symmetric 5 x 5 power.   Data Reviewed: Basic Metabolic Panel:  Recent Labs Lab 12/26/12 1200 12/27/12 0333 12/27/12 1406 12/28/12 0355 12/29/12 0415  NA 142 142  --  137 137  K 2.8* 3.0* 3.4* 2.7* 3.3*  CL 106 105  --  100 101  CO2 25 30  --  30 29  GLUCOSE 143* 116*  --  114* 110*  BUN 17 19  --  19 20  CREATININE 2.09* 2.12*  --  2.22* 2.24*  CALCIUM 8.4 8.3*  --  8.4 8.6  MG 1.8  --  1.6  --   --    Liver Function Tests: No results found for this basename: AST, ALT, ALKPHOS, BILITOT, PROT, ALBUMIN,  in the last 168 hours No results found for this basename: LIPASE, AMYLASE,  in the last 168 hours No results found for this basename: AMMONIA,  in the last 168 hours CBC:  Recent Labs Lab 12/26/12 1200 12/27/12 0333 12/28/12 0355  WBC 5.9 4.8 4.7  HGB 9.7* 9.1* 9.1*  HCT 32.6* 31.0* 29.9*  MCV 73.3* 74.2* 72.9*  PLT 270 228 255   Cardiac Enzymes:  Recent Labs Lab 12/26/12 1200 12/26/12 1833 12/27/12 0035 12/27/12 0630  TROPONINI <0.30 <0.30 <0.30 <0.30   BNP (last 3 results)  Recent Labs  07/30/12 0825 12/26/12 1200 12/27/12 0333  PROBNP 1559.0* 3437.0* 3497.0*   CBG: No results found for this basename: GLUCAP,  in the last 168 hours  Recent Results (from the past 240 hour(s))  MRSA PCR SCREENING     Status: None   Collection Time    12/26/12  7:00 PM      Result Value Range Status   MRSA by PCR NEGATIVE  NEGATIVE Final   Comment:            The GeneXpert MRSA Assay (FDA     approved for NASAL specimens     only), is one component of a     comprehensive MRSA colonization     surveillance program. It is not     intended to diagnose MRSA     infection nor to guide or     monitor treatment for     MRSA infections.      Additional  labs: 1. None     Studies: No results found.      Scheduled Meds: . amLODipine  10 mg Oral Daily  . aspirin EC  81 mg Oral Daily  . carvedilol  25 mg Oral BID WC  . enoxaparin (LOVENOX) injection  60 mg Subcutaneous Q24H  . furosemide  80 mg Intravenous BID  . hydrALAZINE  37.5 mg Oral BID  . isosorbide mononitrate  30 mg Oral Daily  . multivitamin with minerals  1 tablet Oral Daily  . pantoprazole  40 mg Oral Daily  . potassium chloride SA  40 mEq Oral TID  . sodium chloride  3 mL Intravenous Q12H  . spironolactone  25 mg Oral Daily   Continuous Infusions:   Principal Problem:   Acute on chronic combined systolic and diastolic congestive heart failure Active Problems:   Hypokalemia   H/O noncompliance with medical treatment, presenting hazards to health  Non-ischemic cardiomyopathy - EF 30-35%   OSA (obstructive sleep apnea) - supposed to be on CPAP   Accelerated essential hypertension   Anemia   CKD (chronic kidney disease), stage III   Nausea and vomiting   Cardiorenal syndrome with renal failure    Time spent: 15 minutes    [facility]  [facility] Pager [phone].   If 8PM-8AM, please contact night-coverage at [email or web address], password [facility] 12/29/2012, 10:17 AM  LOS: 3 days

## 2012-12-30 LAB — BASIC METABOLIC PANEL
BUN: 20 mg/dL (ref 6–23)
CO2: 26 mEq/L (ref 19–32)
Calcium: 8.4 mg/dL (ref 8.4–10.5)
Glucose, Bld: 113 mg/dL — ABNORMAL HIGH (ref 70–99)
Sodium: 140 mEq/L (ref 135–145)

## 2012-12-30 LAB — MAGNESIUM: Magnesium: 1.8 mg/dL (ref 1.5–2.5)

## 2012-12-30 MED ORDER — METOLAZONE 2.5 MG PO TABS
2.5000 mg | ORAL_TABLET | Freq: Every day | ORAL | Status: DC
  Administered 2012-12-31 – 2013-01-01 (×2): 2.5 mg via ORAL
  Filled 2012-12-30 (×2): qty 1

## 2012-12-30 NOTE — Progress Notes (Signed)
[facility] PROGRESS NOTE  Devon Ortiz ZOX:[id number] DOB: 21-Jul-1965 DOA: 12/26/2012 PCP: Dorrene German, MD Primary Cardiologist: Dr. Bryan Lemma.  HPI/Brief narrative 47 y.o. male with PMH of morbid obesity, OSA on nightly CPAP, HTN, stage III chronic kidney disease with baseline creatinine of around 1.75, chronic combined CHF, recent echo on 07/25/12 showed EF 30-35% & grade 2 diastolic dysfunction, last cardiac cath 2012 demonstrated normal coronaries, and nonischemic cardiomyopathy presented to the ED on 12/26/12 with complaints of worsening body swelling, dyspnea, nausea and vomiting. Workup in ED reveals BNP 3437, chest x-ray suggestive of pulmonary edema and small right-sided effusion, POC troponin positive at 0.10 but subsequent lab troponin negative, EKG without acute changes, potassium 2.8, creatinine 2.09 and hemoglobin of 9.7. His initial BP was 173/117 mmHg. Cardiology consulted and patient was admitted to step down unit for management of decompensated CHF and accelerated hypertension. He was started on oral antihypertensive medications with some changes with good BP control. He was started on IV Lasix with good diuresis. Transferred to telemetry after 24 hours and continues to diurese and cardiology is following.   Assessment/Plan:  Acute on chronic combined CHF/anasarca/nonischemic cardiomyopathy - Likely precipitated by inability to keep oral medications down from nausea and vomiting, accelerated hypertension & dietary indiscretion. - Echo 07/25/12 showed EF 30-35% & grade 2 diastolic dysfunction. - Last cardiac cath 2012 demonstrated normal coronaries. - Cardiology consulted and are following - Patient was started on IV Lasix and has started to improve. Continue IV Lasix. Diuresed 9.7 liters since admission. - No ACE inhibitor/ARB secondary to renal insufficiency. - Troponins negative and EKG without acute changes. - Urine output seems to have reduced in the last 24  hours. - Discussed with cardiology to consider increasing Lasix dose or adding another agent i.e. Zaroxolyn.  Accelerated HTN/hypertensive urgency - Likely secondary to inability to keep oral medications down from nausea and vomiting. - Continue carvedilol, hydralazine at reduced dose, Aldactone, Lasix & amlodipine. - Controlled.  Hypokalemia - Secondary to diuresis. - Improving. Potassium increased to 40 meq 3 times a day yesterday.  - Finally corrected. Monitor closely.  Nausea and vomiting - Secondary to hydralazine versus congestive gastropathy. - resolved. - Continue hydralazine at reduced dose and IV PPI-change to by mouth.  Stage III chronic kidney disease - Creatinine slightly increased compared to admission. Monitor closely while on IV diuresis. - ? Cardiorenal syndrome. - Creatinine stable.  Microcytic anemia - Stable.  OSA - Continue nightly CPAP.   DVT prophylaxis: Lovenox Lines/catheters: PIV Nutrition: Heart healthy diet  Activity:  Up with assistance Code Status: Full Family Communication: None Disposition Plan: Home when medically stable.   Consultants:  Cardiology  Procedures:  None  Antibiotics:  None   Subjective: States that urine output has decreased. Weight has only decreased from 290 pounds on admission to 285 pounds? Accuracy. Still had some dyspnea/orthopnea last night.  Objective: Filed Vitals:   12/29/12 0522 12/29/12 1346 12/29/12 2200 12/30/12 0600  BP:  123/83 120/88 133/87  Pulse: 82 83 80 83  Temp:  98.5 F (36.9 C) 98.2 F (36.8 C) 98 F (36.7 C)  TempSrc:  Oral Oral Oral  Resp: 18 20 18 18   Height:      Weight:    129.5 kg (285 lb 7.9 oz)  SpO2: 98% 100% 100% 100%    Intake/Output Summary (Last 24 hours) at 12/30/12 1105 Last data filed at 12/30/12 1017  Gross per 24 hour  Intake   2043 ml  Output  2226 ml  Net   -183 ml   Filed Weights   12/28/12 0548 12/29/12 0500 12/30/12 0600  Weight: 130.1 kg (286  lb 13.1 oz) 129.5 kg (285 lb 7.9 oz) 129.5 kg (285 lb 7.9 oz)     Exam:  General exam: Moderately built and morbidly obese male patient lying comfortably in bed. Respiratory system: Seems clear to auscultation. No increased work of breathing. Cardiovascular system: S1 & S2 heard, RRR. No JVD, murmurs, gallops, clicks. 3+ pitting bilateral leg edema extending to anterior abdominal wall and sacral edema-slowly decreasing. Telemetry: Sinus rhythm. Gastrointestinal system: Abdomen is distended, soft and nontender. Pitting anterior abdominal wall edema-decreasing. Normal bowel sounds heard. Central nervous system: Alert and oriented. No focal neurological deficits. Extremities: Symmetric 5 x 5 power.   Data Reviewed: Basic Metabolic Panel:  Recent Labs Lab 12/26/12 1200 12/27/12 0333 12/27/12 1406 12/28/12 0355 12/29/12 0415 12/30/12 0550  NA 142 142  --  137 137 140  K 2.8* 3.0* 3.4* 2.7* 3.3* 3.9  CL 106 105  --  100 101 105  CO2 25 30  --  30 29 26   GLUCOSE 143* 116*  --  114* 110* 113*  BUN 17 19  --  19 20 20   CREATININE 2.09* 2.12*  --  2.22* 2.24* 2.20*  CALCIUM 8.4 8.3*  --  8.4 8.6 8.4  MG 1.8  --  1.6  --   --  1.8   Liver Function Tests: No results found for this basename: AST, ALT, ALKPHOS, BILITOT, PROT, ALBUMIN,  in the last 168 hours No results found for this basename: LIPASE, AMYLASE,  in the last 168 hours No results found for this basename: AMMONIA,  in the last 168 hours CBC:  Recent Labs Lab 12/26/12 1200 12/27/12 0333 12/28/12 0355  WBC 5.9 4.8 4.7  HGB 9.7* 9.1* 9.1*  HCT 32.6* 31.0* 29.9*  MCV 73.3* 74.2* 72.9*  PLT 270 228 255   Cardiac Enzymes:  Recent Labs Lab 12/26/12 1200 12/26/12 1833 12/27/12 0035 12/27/12 0630  TROPONINI <0.30 <0.30 <0.30 <0.30   BNP (last 3 results)  Recent Labs  12/26/12 1200 12/27/12 0333 12/30/12 0550  PROBNP 3437.0* 3497.0* 1682.0*   CBG: No results found for this basename: GLUCAP,  in the last 168  hours  Recent Results (from the past 240 hour(s))  MRSA PCR SCREENING     Status: None   Collection Time    12/26/12  7:00 PM      Result Value Range Status   MRSA by PCR NEGATIVE  NEGATIVE Final   Comment:            The GeneXpert MRSA Assay (FDA     approved for NASAL specimens     only), is one component of a     comprehensive MRSA colonization     surveillance program. It is not     intended to diagnose MRSA     infection nor to guide or     monitor treatment for     MRSA infections.      Additional labs: 1. None     Studies: No results found.      Scheduled Meds: . amLODipine  10 mg Oral Daily  . aspirin EC  81 mg Oral Daily  . carvedilol  25 mg Oral BID WC  . enoxaparin (LOVENOX) injection  60 mg Subcutaneous Q24H  . furosemide  80 mg Intravenous BID  . hydrALAZINE  37.5 mg Oral BID  .  isosorbide mononitrate  30 mg Oral Daily  . multivitamin with minerals  1 tablet Oral Daily  . pantoprazole  40 mg Oral Daily  . potassium chloride SA  40 mEq Oral TID  . sodium chloride  3 mL Intravenous Q12H  . spironolactone  25 mg Oral Daily   Continuous Infusions:   Principal Problem:   Acute on chronic combined systolic and diastolic congestive heart failure Active Problems:   Hypokalemia   H/O noncompliance with medical treatment, presenting hazards to health   Non-ischemic cardiomyopathy - EF 30-35%   OSA (obstructive sleep apnea) - supposed to be on CPAP   Accelerated essential hypertension   Anemia   CKD (chronic kidney disease), stage III   Nausea and vomiting   Cardiorenal syndrome with renal failure    Time spent: 15 minutes    [facility]  [facility] Pager [phone].   If 8PM-8AM, please contact night-coverage at [email or web address], password [facility] 12/30/2012, 11:05 AM  LOS: 4 days

## 2012-12-30 NOTE — Progress Notes (Signed)
Pt. Stated he was having trouble with his home CPAP and wanted to speak with RRT.  I contacted RRT, and Cordelia Pen told me that pt would need to contact his home health agency that provided him with his CPAP machine.  I will advise patient.  Cordelia Pen did say that he could use a hospital CPAP machine if he needs to tonight.

## 2012-12-30 NOTE — Progress Notes (Signed)
Patient has home CPAP unit at bedside. He prefers self placement. He is aware that RT is available if he should need assistance or have complications with his machine. He voices complaints that his mask does not keep a seal due to old straps. He states he is not able to get another head gear until he sees this primary MD. He also declines the use of a hospital provided machine/mask at this time.

## 2012-12-30 NOTE — Progress Notes (Signed)
[facility]  Subjective: Breathing better each day. Has concerns re: needing replacement equipment for CPAP machine  Nausea has improved with prn zofran.   Objective: Vital signs in last 24 hours: Temp:  [97.7 F (36.5 C)-98.2 F (36.8 C)] 97.7 F (36.5 C) (09/07 1500) Pulse Rate:  [78-83] 78 (09/07 1500) Resp:  [18] 18 (09/07 1500) BP: (120-133)/(82-88) 130/82 mmHg (09/07 1500) SpO2:  [100 %] 100 % (09/07 1500) Weight:  [285 lb 7.9 oz (129.5 kg)] 285 lb 7.9 oz (129.5 kg) (09/07 0600) Last BM Date: 12/29/12  Intake/Output from previous day: Overall Output is equal to previous day, but Intake was considerably higher, therefor less Net Out.   -- Currently net Neg 10.7 L, despite scales not showing change in wgt.   09/06 0701 - 09/07 0700 In: 1920 [P.O.:1920] Out: 2726 [Urine:2725; Stool:1] Intake/Output this shift: Total I/O In: 963 [P.O.:960; I.V.:3] Out: 1400 [Urine:1400]  Medications Current Facility-Administered Medications  Medication Dose Route Frequency Provider Last Rate Last Dose  . acetaminophen (TYLENOL) tablet 650 mg  650 mg Oral Q4H PRN Elease Etienne, MD      . albuterol (PROVENTIL) (5 MG/ML) 0.5% nebulizer solution 2.5 mg  2.5 mg Nebulization Q2H PRN Elease Etienne, MD   2.5 mg at 12/30/12 1610  . amLODipine (NORVASC) tablet 10 mg  10 mg Oral Daily Brittainy Simmons, PA-C   10 mg at 12/30/12 1019  . aspirin EC tablet 81 mg  81 mg Oral Daily Elease Etienne, MD   81 mg at 12/30/12 1019  . carvedilol (COREG) tablet 25 mg  25 mg Oral BID WC Brittainy Simmons, PA-C   25 mg at 12/30/12 1646  . enoxaparin (LOVENOX) injection 60 mg  60 mg Subcutaneous Q24H Elease Etienne, MD   60 mg at 12/29/12 2217  . furosemide (LASIX) injection 80 mg  80 mg Intravenous BID Brittainy Simmons, PA-C   80 mg at 12/30/12 1753  . hydrALAZINE (APRESOLINE) tablet 37.5 mg  37.5 mg Oral BID Brittainy Simmons, PA-C   37.5 mg at 12/30/12 1018  . isosorbide  mononitrate (IMDUR) 24 hr tablet 30 mg  30 mg Oral Daily Elease Etienne, MD   30 mg at 12/30/12 1019  . labetalol (NORMODYNE,TRANDATE) injection 10 mg  10 mg Intravenous Q2H PRN Elease Etienne, MD   10 mg at 12/26/12 1905  . [START ON 12/31/2012] metolazone (ZAROXOLYN) tablet 2.5 mg  2.5 mg Oral Daily Marykay Lex, MD      . multivitamin with minerals tablet 1 tablet  1 tablet Oral Daily Elease Etienne, MD   1 tablet at 12/30/12 1019  . ondansetron (ZOFRAN) injection 4 mg  4 mg Intravenous Q6H PRN Elease Etienne, MD      . pantoprazole (PROTONIX) EC tablet 40 mg  40 mg Oral Daily Winfield Rast, RPH   40 mg at 12/30/12 1019  . potassium chloride SA (K-DUR,KLOR-CON) CR tablet 40 mEq  40 mEq Oral TID Marykay Lex, MD   40 mEq at 12/30/12 1646  . sodium chloride 0.9 % injection 3 mL  3 mL Intravenous Q12H Elease Etienne, MD   3 mL at 12/30/12 1017  . sodium chloride 0.9 % injection 3 mL  3 mL Intravenous PRN Elease Etienne, MD      . spironolactone (ALDACTONE) tablet 25 mg  25 mg Oral Daily Brittainy Simmons, PA-C   25 mg at 12/30/12 1019  .  zolpidem (AMBIEN) tablet 10 mg  10 mg Oral QHS PRN Rolan Lipa, NP   10 mg at 12/29/12 2217    PE: General appearance: alert, cooperative and no distress Lungs: CTAB, non-labored. Heart: regular rate and rhythm, Nl S1/S2 - distant, no M/R/G heard. Abd: obese, soft/NT -- edema; due to body habitus, ? +/- ascites. Extremities: 1-2+ bilateral pretibial edema Pulses: 2+ and symmetric Skin: warm and dry Neurologic: Grossly normal  Lab Results:   Recent Labs  12/28/12 0355  WBC 4.7  HGB 9.1*  HCT 29.9*  PLT 255   BMET  Recent Labs  12/28/12 0355 12/29/12 0415 12/30/12 0550  NA 137 137 140  K 2.7* 3.3* 3.9  CL 100 101 105  CO2 30 29 26   GLUCOSE 114* 110* 113*  BUN 19 20 20   CREATININE 2.22* 2.24* 2.20*  CALCIUM 8.4 8.6 8.4   Cardiac Panel (last 3 results) No results found for this basename: CKTOTAL, CKMB,  TROPONINI, RELINDX,  in the last 72 hours BNP (last 3 results)  Recent Labs  12/26/12 1200 12/27/12 0333 12/30/12 0550  PROBNP 3437.0* 3497.0* 1682.0*   Filed Weights   12/28/12 0548 12/29/12 0500 12/30/12 0600  Weight: 286 lb 13.1 oz (130.1 kg) 285 lb 7.9 oz (129.5 kg) 285 lb 7.9 oz (129.5 kg)    Assessment/Plan   Principal Problem:   Acute on chronic combined systolic and diastolic congestive heart failure Active Problems:   H/O noncompliance with medical treatment, presenting hazards to health   Non-ischemic cardiomyopathy - EF 30-35%   Accelerated essential hypertension   Hypokalemia   OSA (obstructive sleep apnea) - supposed to be on CPAP   Cardiorenal syndrome with renal failure   Anemia   CKD (chronic kidney disease), stage III   Nausea and vomiting    LOS: 4 days   Brisk diuresis~10.7L out in total ; he began ~30 lb above prior dry wgt.  Will need persistent diuresis - with IV lasix. Continue with current IV dosing for now. (His dry wgt may actually be closer to ~240 lb, since he claims to have lost wgt down to ~237lb. Would like to get to ~260 lb.  Ordered fluid restriction & adding Metolazone to AM lasix dose.  K & Mag more stable.   On Spironolactone but with renal function ~Cr of 2.2, cannot increase spironolactone.  With K more stable - adding metolazone for added diuresis.  K+ supplementation as standing order.  BP is much improved!! -- can switch back to PO Nitrate today; will keep current Coreg dosing @ 25 mg bid.  Nausea improved -- for now will not increase Hydralazine -- using Hydralazine/Nitrate in lieu of ARB/ACE-I due to CKD IIIb / cardiorenal syndrome  Renal function is holding steady at what seems to be his Basline.  Baseline - CardioRenal Syndrome (high renal afterload) along with HTN as a cause for his renal dysfunction (Cr was ~2.0 in April as well).  Not sure how helpful proBNP will be - can recheck once he is closer to d/c  weight.  On PPI & antiemetics. Needs to be on CPAP qhs. -- needs to eb set up to See PCCM (Dr. Vassie Loll to get new equipment arranged).  We will continue to follow.  MD Time with pt: 15 min  HARDING,DAVID W, M.D., M.S. [facility] [address] [place], [place]  [address]  [phone] Pager # [phone] 12/30/2012 6:58 PM

## 2012-12-30 NOTE — Progress Notes (Signed)
Pt's IV will be out of date at midnight. Tried to re-start and was unsuccessful. IV team paged at 1805. Current IV will not flush.

## 2012-12-31 LAB — BASIC METABOLIC PANEL
CO2: 29 mEq/L (ref 19–32)
Chloride: 103 mEq/L (ref 96–112)
Creatinine, Ser: 2.24 mg/dL — ABNORMAL HIGH (ref 0.50–1.35)
Sodium: 139 mEq/L (ref 135–145)

## 2012-12-31 LAB — PRO B NATRIURETIC PEPTIDE: Pro B Natriuretic peptide (BNP): 1543 pg/mL — ABNORMAL HIGH (ref 0–125)

## 2012-12-31 NOTE — Progress Notes (Signed)
Subjective: No acute episode of SOB last pm like he had the previous 2 nights.  No further nausea  Objective: Vital signs in last 24 hours: Temp:  [97.7 F (36.5 C)-98.7 F (37.1 C)] 98.5 F (36.9 C) (09/08 0500) Pulse Rate:  [78-81] 78 (09/08 0500) Resp:  [18-20] 18 (09/08 0500) BP: (123-130)/(82-88) 123/88 mmHg (09/08 0500) SpO2:  [100 %] 100 % (09/08 0500) Weight:  [283 lb 4.7 oz (128.5 kg)] 283 lb 4.7 oz (128.5 kg) (09/08 0500) Weight change: -2 lb 3.3 oz (-1 kg) Last BM Date: 12/29/12 Intake/Output from previous day: -2247 yesterday ( total for the stay -12,515)  Wt 283 down from pk of 294.  09/07 0701 - 09/08 0700 In: 1203 [P.O.:1200; I.V.:3] Out: 3450 [Urine:3450] Intake/Output this shift: Total I/O In: 0  Out: 1600 [Urine:1600]  PE: General:Pleasant affect, NAD Skin:Warm and dry, brisk capillary refill HEENT:normocephalic, sclera clear, mucus membranes moist Heart:S1S2 RRR without murmur, gallup, rub or click Lungs:clear  Diminished in the basses, without rales, rhonchi, or wheezes AVW:UJWJX, soft, non tender, + BS, do not palpate liver spleen or masses Ext:2+ lower ext edema,up into thighs Neuro:alert and oriented, MAE, follows commands, + facial symmetry   Lab Results: No results found for this basename: WBC, HGB, HCT, PLT,  in the last 72 hours BMET  Recent Labs  12/30/12 0550 12/31/12 0448  NA 140 139  K 3.9 3.8  CL 105 103  CO2 26 29  GLUCOSE 113* 121*  BUN 20 21  CREATININE 2.20* 2.24*  CALCIUM 8.4 8.7   No results found for this basename: TROPONINI, CK, MB,  in the last 72 hours  Lab Results  Component Value Date   CHOL 134 03/10/2012   HDL 33* 03/10/2012   LDLCALC 84 03/10/2012   TRIG 83 03/10/2012   CHOLHDL 4.1 03/10/2012   Lab Results  Component Value Date   HGBA1C 6.0* 03/09/2012     Lab Results  Component Value Date   TSH 0.673 07/24/2012     BNP (last 3 results)  Recent Labs  12/27/12 0333 12/30/12 0550  12/31/12 0448  PROBNP 3497.0* 1682.0* 1543.0*    EKG: Orders placed during the hospital encounter of 12/26/12  . ED EKG    Studies/Results: No results found.  Medications: I have reviewed the patient's current medications. Scheduled Meds: . amLODipine  10 mg Oral Daily  . aspirin EC  81 mg Oral Daily  . carvedilol  25 mg Oral BID WC  . enoxaparin (LOVENOX) injection  60 mg Subcutaneous Q24H  . furosemide  80 mg Intravenous BID  . hydrALAZINE  37.5 mg Oral BID  . isosorbide mononitrate  30 mg Oral Daily  . metolazone  2.5 mg Oral Daily  . multivitamin with minerals  1 tablet Oral Daily  . pantoprazole  40 mg Oral Daily  . potassium chloride SA  40 mEq Oral TID  . sodium chloride  3 mL Intravenous Q12H  . spironolactone  25 mg Oral Daily   Continuous Infusions:  PRN Meds:.acetaminophen, albuterol, labetalol, ondansetron (ZOFRAN) IV, sodium chloride, zolpidem  Assessment/Plan: Principal Problem:   Acute on chronic combined systolic and diastolic congestive heart failure Active Problems:   Hypokalemia   H/O noncompliance with medical treatment, presenting hazards to health   Non-ischemic cardiomyopathy - EF 30-35%   OSA (obstructive sleep apnea) - supposed to be on CPAP   Accelerated essential hypertension   Anemia   CKD (chronic kidney disease), stage III  Nausea and vomiting   Cardiorenal syndrome with renal failure  PLAN: improved diuresis with addition of zaroxolyn. K+ stable along with Mg+ , Pro BNP continues to decrease.  Still with edema into thighs, continue to diuresis.  Cr. Only a little above baseline.  Discussed if he had understanding of diet, did he need to see dietician, he states he has seen several and understands what to do.     We have assumed care of the pt.  LOS: 5 days   Time spent with pt. :15 minutes. [facility] R  Nurse Practitioner Certified Pager [phone] 12/31/2012, 12:19 PM  Continues to diurese. BNP slowly decreasing and SCr stable.  Zaroxolyn added. Follow K/Mg. We still have a way to go.  Agree with note written by Nada Boozer RNP Runell Gess 12/31/2012 5:56 PM

## 2012-12-31 NOTE — Care Management Note (Addendum)
    Page 1 of 2   01/07/2013     2:19:30 PM   CARE MANAGEMENT NOTE 01/07/2013  Patient:  Devon Ortiz, Devon Ortiz   Account Number:  [id number]  Date Initiated:  12/27/2012  Documentation initiated by:  DAVIS,RHONDA  Subjective/Objective Assessment:   pt with hx of chronic and acute renal failure and chf now presents with increased pulmonary dyspnea and increased swelling of the extremities.     Action/Plan:   home with failure program and possibility of P.T. once evaluated   Anticipated DC Date:  12/30/2012   Anticipated DC Plan:  HOME W HOME HEALTH SERVICES  In-house referral  Financial Counselor      DC Planning Services  CM consult  MATCH Program  Medication Assistance      Choice offered to / List presented to:  C-1 Patient   DME arranged  [facility]      DME agency  [facility]     HH arranged  [facility]      HH agency  [facility]   Status of service:  In process, will continue to follow Medicare Important Message given?  [facility] - LOS <3 / Initial given by admissions (If response is "NO", the following Medicare IM given date fields will be blank) Date Medicare IM given:   Date Additional Medicare IM given:    Discharge Disposition:  HOME/SELF CARE  Per UR Regulation:  Reviewed for med. necessity/level of care/duration of stay  If discussed at Long Length of Stay Meetings, dates discussed:    Comments:   01-07-13 [address] [place], [place] [phone] Pt d/ home and in need of medication assistance. Pt states dose not have enough money to cover full price of meds. MATCH program utilized at this time. Pt working part time. Match Lettr provided to pt. No further needs from CM at this time.   01-04-13 [address] Devon Bamberger, RN,BSN [phone] Tx from [facility] for new CVA. PT to consult for disposition needs. CM will conitnue to f/u.  01/02/13 MMcGibboney, RN, BSN Pt cco Slurred speech secondary to new CVA last pm. MRI confirmed CVA, possibly embolic. ? Does he need a TEE. Pt transferred to  [facility].   12/31/2012 Devon Ortiz [phone] CM spoke with patient. Pt states he currently lives in [place]. Currently does not have health insurance. Works part-time. PCP -Dr Wendie Simmer, cardiologist-Dr Herbie Baltimore with [facility] cardiologist. Gets meds at [facility] on [address] [place]. Pt is ambulatory. Pt is ambulatory. States he does not use RW or cane. States he does have a friend that [facility] offer support if neded . Financial counselor notified of self pay status.    [id number] Devon Plater, RN, BSN, [facility] [phone] Chart Reviewed for discharge and hospital needs. Discharge needs at time of review:  Request for home heart failure program noted and list given to patient.  Did mark which program do have FH programs for his review and decision. Review of patient progress due on [id number].

## 2012-12-31 NOTE — Progress Notes (Signed)
[facility] PROGRESS NOTE  Devon Ortiz:[id number] DOB: 1966/02/19 DOA: 12/26/2012 PCP: Dorrene German, MD Primary Cardiologist: Dr. Bryan Lemma.  HPI/Brief narrative 47 y.o. male with PMH of morbid obesity, OSA on nightly CPAP, HTN, stage III chronic kidney disease with baseline creatinine of around 1.75, chronic combined CHF, recent echo on 07/25/12 showed EF 30-35% & grade 2 diastolic dysfunction, last cardiac cath 2012 demonstrated normal coronaries, and nonischemic cardiomyopathy presented to the ED on 12/26/12 with complaints of worsening body swelling, dyspnea, nausea and vomiting. Workup in ED reveals BNP 3437, chest x-ray suggestive of pulmonary edema and small right-sided effusion, POC troponin positive at 0.10 but subsequent lab troponin negative, EKG without acute changes, potassium 2.8, creatinine 2.09 and hemoglobin of 9.7. His initial BP was 173/117 mmHg. Cardiology consulted and patient was admitted to step down unit for management of decompensated CHF and accelerated hypertension. He was started on oral antihypertensive medications with some changes with good BP control. He was started on IV Lasix with good diuresis. Transferred to telemetry after 24 hours and continues to diurese and cardiology is following.   Assessment/Plan:  Acute on chronic combined CHF/anasarca/nonischemic cardiomyopathy - Likely precipitated by inability to keep oral medications down from nausea and vomiting, accelerated hypertension & dietary indiscretion. - Echo 07/25/12 showed EF 30-35% & grade 2 diastolic dysfunction. - Last cardiac cath 2012 demonstrated normal coronaries. - Cardiology consulted and are following - Patient was started on IV Lasix and has started to improve. Continue IV Lasix. Diuresed ~13 liters since admission. - No ACE inhibitor/ARB secondary to renal insufficiency. - Troponins negative and EKG without acute changes. - Cardiology started [facility] on 9/8. ? Lasix infusion  if inadequate response. - Slowly improving. Strict fluid restriction implemented 9/7. - Discussed with primary cardiologist Dr. Herbie Baltimore on 12/31/12 and requested that they take over primary care of this patient who at this time has primarily cardiac issues that will need a few more days of agressive inpatient hospital care. Dr. Herbie Baltimore has agreed and will let his team know.  Accelerated HTN/hypertensive urgency - Likely secondary to inability to keep oral medications down from nausea and vomiting. - Continue carvedilol, hydralazine at reduced dose, Aldactone, Lasix & amlodipine. - Controlled.  Hypokalemia - Secondary to diuresis. - Improving. Potassium increased to 40 meq 3 times a day yesterday.  - Finally corrected. Monitor closely.  Nausea and vomiting - Secondary to hydralazine versus congestive gastropathy. - resolved. - Continue hydralazine at reduced dose and IV PPI-changed to by mouth.  Stage III chronic kidney disease - Creatinine slightly increased compared to admission. Monitor closely while on IV diuresis. - ? Cardiorenal syndrome. - Creatinine stable.  Microcytic anemia - Stable.  OSA - Continue nightly CPAP.   DVT prophylaxis: Lovenox Lines/catheters: PIV Nutrition: Heart healthy diet  Activity:  Up with assistance Code Status: Full Family Communication: None Disposition Plan: Home when medically stable-not ready yet. Transfer to cardiology service.   Consultants:  Cardiology  Procedures:  None  Antibiotics:  None   Subjective: DOE. No significant change in urine output yesterday-started Zaroxolyn this morning. Body swelling slowed to decrease.  Objective: Filed Vitals:   12/30/12 0600 12/30/12 1500 12/30/12 2200 12/31/12 0500  BP: 133/87 130/82 124/86 123/88  Pulse: 83 78 81 78  Temp: 98 F (36.7 C) 97.7 F (36.5 C) 98.7 F (37.1 C) 98.5 F (36.9 C)  TempSrc: Oral Axillary Oral Oral  Resp: 18 18 20 18   Height:      Weight: 129.5 kg (285  lb 7.9 oz)   128.5 kg (283 lb 4.7 oz)  SpO2: 100% 100% 100% 100%    Intake/Output Summary (Last 24 hours) at 12/31/12 1107 Last data filed at 12/31/12 1047  Gross per 24 hour  Intake    840 ml  Output   3650 ml  Net  -2810 ml   Filed Weights   12/29/12 0500 12/30/12 0600 12/31/12 0500  Weight: 129.5 kg (285 lb 7.9 oz) 129.5 kg (285 lb 7.9 oz) 128.5 kg (283 lb 4.7 oz)     Exam:  General exam: Moderately built and morbidly obese male ambulating in room. Appears comfortable. Respiratory system: Occasional basal crackles but otherwise clear to auscultation. No increased work of breathing. Cardiovascular system: S1 & S2 heard, RRR. No JVD, murmurs, gallops, clicks. 3+ pitting bilateral leg edema extending to anterior abdominal wall and sacral edema-slowly decreasing. Telemetry: Sinus rhythm. Gastrointestinal system: Abdomen is distended, soft and nontender. Pitting anterior abdominal wall edema-decreasing. Normal bowel sounds heard. Central nervous system: Alert and oriented. No focal neurological deficits. Extremities: Symmetric 5 x 5 power.   Data Reviewed: Basic Metabolic Panel:  Recent Labs Lab 12/26/12 1200 12/27/12 0333 12/27/12 1406 12/28/12 0355 12/29/12 0415 12/30/12 0550 12/31/12 0448  NA 142 142  --  137 137 140 139  K 2.8* 3.0* 3.4* 2.7* 3.3* 3.9 3.8  CL 106 105  --  100 101 105 103  CO2 25 30  --  30 29 26 29   GLUCOSE 143* 116*  --  114* 110* 113* 121*  BUN 17 19  --  19 20 20 21   CREATININE 2.09* 2.12*  --  2.22* 2.24* 2.20* 2.24*  CALCIUM 8.4 8.3*  --  8.4 8.6 8.4 8.7  MG 1.8  --  1.6  --   --  1.8  --    Liver Function Tests: No results found for this basename: AST, ALT, ALKPHOS, BILITOT, PROT, ALBUMIN,  in the last 168 hours No results found for this basename: LIPASE, AMYLASE,  in the last 168 hours No results found for this basename: AMMONIA,  in the last 168 hours CBC:  Recent Labs Lab 12/26/12 1200 12/27/12 0333 12/28/12 0355  WBC 5.9 4.8 4.7   HGB 9.7* 9.1* 9.1*  HCT 32.6* 31.0* 29.9*  MCV 73.3* 74.2* 72.9*  PLT 270 228 255   Cardiac Enzymes:  Recent Labs Lab 12/26/12 1200 12/26/12 1833 12/27/12 0035 12/27/12 0630  TROPONINI <0.30 <0.30 <0.30 <0.30   BNP (last 3 results)  Recent Labs  12/27/12 0333 12/30/12 0550 12/31/12 0448  PROBNP 3497.0* 1682.0* 1543.0*   CBG: No results found for this basename: GLUCAP,  in the last 168 hours  Recent Results (from the past 240 hour(s))  MRSA PCR SCREENING     Status: None   Collection Time    12/26/12  7:00 PM      Result Value Range Status   MRSA by PCR NEGATIVE  NEGATIVE Final   Comment:            The GeneXpert MRSA Assay (FDA     approved for NASAL specimens     only), is one component of a     comprehensive MRSA colonization     surveillance program. It is not     intended to diagnose MRSA     infection nor to guide or     monitor treatment for     MRSA infections.      Additional labs: 1. None  Studies: No results found.      Scheduled Meds: . amLODipine  10 mg Oral Daily  . aspirin EC  81 mg Oral Daily  . carvedilol  25 mg Oral BID WC  . enoxaparin (LOVENOX) injection  60 mg Subcutaneous Q24H  . furosemide  80 mg Intravenous BID  . hydrALAZINE  37.5 mg Oral BID  . isosorbide mononitrate  30 mg Oral Daily  . metolazone  2.5 mg Oral Daily  . multivitamin with minerals  1 tablet Oral Daily  . pantoprazole  40 mg Oral Daily  . potassium chloride SA  40 mEq Oral TID  . sodium chloride  3 mL Intravenous Q12H  . spironolactone  25 mg Oral Daily   Continuous Infusions:   Principal Problem:   Acute on chronic combined systolic and diastolic congestive heart failure Active Problems:   Hypokalemia   H/O noncompliance with medical treatment, presenting hazards to health   Non-ischemic cardiomyopathy - EF 30-35%   OSA (obstructive sleep apnea) - supposed to be on CPAP   Accelerated essential hypertension   Anemia   CKD (chronic kidney  disease), stage III   Nausea and vomiting   Cardiorenal syndrome with renal failure    Time spent: 15 minutes    [facility]  [facility] Pager [phone].   If 8PM-8AM, please contact night-coverage at [email or web address], password [facility] 12/31/2012, 11:07 AM  LOS: 5 days

## 2012-12-31 NOTE — Progress Notes (Signed)
CM spoke with patient . Pt states he currently lives in [place]. Currently does not have health insurance. Works part-time . PCP-Dr Wendie Simmer, cardiologist-Dr Susette Racer with [facility] cardiologist . Gets meds at [facility] on [address] in [place]. Pt is ambulatory. States he does not use RW or cane.  Has friend that will offer support. Financial couselor notified of self pay status.

## 2013-01-01 ENCOUNTER — Inpatient Hospital Stay (HOSPITAL_COMMUNITY): Payer: Medicaid Other

## 2013-01-01 DIAGNOSIS — I5189 Other ill-defined heart diseases: Secondary | ICD-10-CM | POA: Diagnosis present

## 2013-01-01 DIAGNOSIS — I639 Cerebral infarction, unspecified: Secondary | ICD-10-CM | POA: Diagnosis present

## 2013-01-01 LAB — BASIC METABOLIC PANEL
CO2: 31 mEq/L (ref 19–32)
Chloride: 99 mEq/L (ref 96–112)
Sodium: 136 mEq/L (ref 135–145)

## 2013-01-01 MED ORDER — GUAIFENESIN ER 600 MG PO TB12
1200.0000 mg | ORAL_TABLET | Freq: Two times a day (BID) | ORAL | Status: DC | PRN
  Filled 2013-01-01: qty 2

## 2013-01-01 MED ORDER — INFLUENZA VAC SPLIT QUAD 0.5 ML IM SUSP
0.5000 mL | INTRAMUSCULAR | Status: AC
  Administered 2013-01-02: 0.5 mL via INTRAMUSCULAR
  Filled 2013-01-01 (×2): qty 0.5

## 2013-01-01 NOTE — Progress Notes (Signed)
Patient has a home CPAP machine that he has been using. Patient likes to place his own mask himself when he is ready to go to bed. Patient has also been informed that if he does ever need assistance with the machine, RT is here to help as needed. RT will continue to assist as needed.

## 2013-01-01 NOTE — Progress Notes (Signed)
[facility]  Subjective: Breathing better each day. Feels a bit congested  Objective: Vital signs in last 24 hours: Temp:  [98 F (36.7 C)-98.6 F (37 C)] 98 F (36.7 C) (09/09 1405) Pulse Rate:  [74-82] 79 (09/09 1635) Resp:  [18-20] 18 (09/09 1405) BP: (108-134)/(67-91) 117/70 mmHg (09/09 1635) SpO2:  [96 %-100 %] 96 % (09/09 1405) Weight:  [282 lb 3 oz (128 kg)] 282 lb 3 oz (128 kg) (09/09 0500) Last BM Date: 01/01/13  Intake/Output from previous day: Overall Output is equal to previous day, but Intake was considerably higher, therefor less Net Out.   -- Currently net Neg 10.7 L, despite scales not showing change in wgt.   09/08 0701 - 09/09 0700 In: 540 [P.O.:540] Out: 4310 [Urine:4310] Intake/Output this shift: Total I/O In: 760 [P.O.:760] Out: 1525 [Urine:1525]  Medications -- reviewed   PE: General appearance: alert, cooperative and no distress Lungs: CTAB, non-labored. Heart: regular rate and rhythm, Nl S1/S2 - distant, no M/R/G heard.; cannot really assess JVP with thick neck. Abd: obese, soft/NT -- edema; due to body habitus,- much less tense! Extremities: 1+ bilateral pretibial edema Pulses: 2+ and symmetric Skin: warm and dry Neurologic: Grossly normal  Lab Results:  No results found for this basename: WBC, HGB, HCT, PLT,  in the last 72 hours BMET  Recent Labs  12/30/12 0550 12/31/12 0448 01/01/13 0504  NA 140 139 136  K 3.9 3.8 3.9  CL 105 103 99  CO2 26 29 31   GLUCOSE 113* 121* 115*  BUN 20 21 26*  CREATININE 2.20* 2.24* 2.34*  CALCIUM 8.4 8.7 9.1   Cardiac Panel (last 3 results) No results found for this basename: CKTOTAL, CKMB, TROPONINI, RELINDX,  in the last 72 hours BNP (last 3 results)  Recent Labs  12/27/12 0333 12/30/12 0550 12/31/12 0448  PROBNP 3497.0* 1682.0* 1543.0*   Filed Weights   12/30/12 0600 12/31/12 0500 01/01/13 0500  Weight: 285 lb 7.9 oz (129.5 kg) 283 lb 4.7 oz (128.5 kg) 282  lb 3 oz (128 kg)    Assessment/Plan   Principal Problem:   Acute on chronic combined systolic and diastolic congestive heart failure Active Problems:   H/O noncompliance with medical treatment, presenting hazards to health   Non-ischemic cardiomyopathy - EF 30-35% 2D April 2014   Accelerated essential hypertension   Hypokalemia   OSA (obstructive sleep apnea) - supposed to be on CPAP   Cardiorenal syndrome with renal failure   SOB (shortness of breath)   Acute on chronic renal insufficiency   S/P cardiac catheterization, 01/21/11 with normal coronary arteries   Anemia   CKD (chronic kidney disease), stage III   Diastolic dysfunction- grade 2 by echo April 2014    LOS: 6 days    Brisk diuresis continues -- more out with metolazone (but Cr up a bit today)   Ordered fluid restriction & will hold Metolazone & continue lasix IV @ x 2 more days then reassess  K & Mag more stable.   On Spironolactone but with renal function ~Cr of 2.2, cannot increase spironolactone.  K+ supplementation as standing order.  BP is much improved!! -- can switch back to PO Nitrate today; will keep current Coreg dosing @ 25 mg bid.  Nausea improved -- for now will not increase Hydralazine -- using Hydralazine/Nitrate in lieu of ARB/ACE-I due to CKD IIIb / cardiorenal syndrome  Renal function is holding steady at what seems to be his Baseline -  but is up a bit.   Baseline - CardioRenal Syndrome (high renal afterload) along with HTN as a cause for his renal dysfunction (Cr was ~2.0 in April as well).  Not sure how helpful proBNP will be - can recheck once he is closer to d/c weight.  On PPI & antiemetics - much improved Nausea Needs to be on CPAP qhs. -- needs to be set up to See PCCM (Dr. Vassie Loll to get new equipment arranged).  He needs to be OOB & ambulate.  Anticipate ~2 more days IV diuresis then convert to PO (will probably add back metolazone for d/c)  MD Time with pt: 15  min  HARDING,DAVID W, M.D., M.S. [facility] [address] [place], [place]  [address]  [phone] Pager # [phone] 01/01/2013 5:12 PM

## 2013-01-01 NOTE — Consult Note (Signed)
Referring Physician: Dr. Herbie Baltimore    Chief Complaint: New-onset right facial weakness and slurred speech.  HPI: Devon Ortiz is an 47 y.o. male with a history of systolic and diastolic heart failure, hypertension, obesity, obstructive sleep apnea and chronic renal insufficiency, admitted on 12/26/2012 for exacerbation of heart failure. Patient developed acute onset of right facial weakness and slurred speech at around 5 PM today. He did not inform nursing staff until about 10 PM tonight. There is no previous history of stroke nor TIA. Patient has been on aspirin daily for antiplatelet therapy. CT scan of his head showed an old small left basal ganglia lacunar type infarction. No acute changes were noted. NIH stroke score was 3.  LSN: 5 PM on 01/01/2013 tPA Given: No: Mild focal deficits; beyond time window for treatment consideration. MRankin: 0  Past Medical History  Diagnosis Date  . Non-ischemic cardiomyopathy 12/2010    2D echo - EF 45-50, Grade 1 D Dysfxn; CATH with no CAD  . Abnormal echocardiogram 07/2012    EF 30-35%, mod Conc LVH; Grade 2 D Dysfunction (Pseudonormal), Mod MR/TR, PAP ~58 mmHg; Mod LA dilation  . S/P cardiac catheterization [phone]    Myoview with ? Inferior Ischemia: -- CATH- Large, draping coronary arteries, No CAD.  Marland Kitchen Chronic combined systolic and diastolic HF (heart failure), NYHA class 2 01/21/11  . Hypertension with target organ involvement     Cardiomyopathy, CKD III  . Morbid obesity with BMI of 40.0-44.9, adult   . OSA (obstructive sleep apnea)     on C-pap, sleep study was performed 03/10/09, REM sleep 86 minutes  . 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 History of hypokalemia   . H/O noncompliance with medical treatment, presenting hazards to health 12/26/2012    Family History  Problem Relation Age of Onset  . Migraines Mother   . Diabetes Maternal Grandmother   . Kidney disease Maternal Grandmother      Medications: I  have reviewed the patient's current medications.  ROS: History obtained from the patient  General ROS: negative for - chills, fatigue, fever, night sweats, weight gain or weight loss Psychological ROS: negative for - behavioral disorder, hallucinations, memory difficulties, mood swings or suicidal ideation Ophthalmic ROS: negative for - blurry vision, double vision, eye pain or loss of vision ENT ROS: negative for - epistaxis, nasal discharge, oral lesions, sore throat, tinnitus or vertigo Allergy and Immunology ROS: negative for - hives or itchy/watery eyes Hematological and Lymphatic ROS: negative for - bleeding problems, bruising or swollen lymph nodes Endocrine ROS: negative for - galactorrhea, hair pattern changes, polydipsia/polyuria or temperature intolerance Respiratory ROS: negative for - cough, hemoptysis, shortness of breath or wheezing Cardiovascular ROS: Positive for shortness of breath and edema of lower extremities associated with heart failure Gastrointestinal ROS: negative for - abdominal pain, diarrhea, hematemesis, nausea/vomiting or stool incontinence Genito-Urinary ROS: negative for - dysuria, hematuria, incontinence or urinary frequency/urgency Musculoskeletal ROS: negative for - joint swelling or muscular weakness Neurological ROS: as noted in HPI Dermatological ROS: negative for rash and skin lesion changes  Physical Examination: Blood pressure 118/69, pulse 78, temperature 98.5 F (36.9 C), temperature source Oral, resp. rate 18, height 5\' 8"  (1.727 m), weight 128 kg (282 lb 3 oz), SpO2 100.00%.  Neurologic Examination: Mental Status: Alert, oriented, thought content appropriate.  Speech slightly slurred without evidence of aphasia. Able to follow commands without difficulty. Cranial Nerves: II-Visual fields were normal. III/IV/VI-Pupils were equal and reacted. Extraocular movements were  full and conjugate.    V/VII-no facial numbness and no facial  weakness. VIII-normal. X-mild dysarthria; symmetrical palatal movement. Motor: 5/5 bilaterally with normal tone and bulk Sensory: Normal throughout. Deep Tendon Reflexes: 2+ and symmetric. Plantars: Flexor bilaterally Cerebellar: Normal finger-to-nose testing.  Ct Head Wo Contrast  01/01/2013   *RADIOLOGY REPORT*  Clinical Data: Right facial droop with new onset slurred speech.  CT HEAD WITHOUT CONTRAST  Technique:  Contiguous axial images were obtained from the base of the skull through the vertex without contrast.  Comparison: None.  Findings: There is a small area of focal low attenuation change in the left thalamus suggesting lacunar infarct, age indeterminate. The ventricles and sulci are symmetrical.  No mass effect or midline shift.  No abnormal extra-axial fluid collections.  Wallace Cullens- white matter junctions are distinct.  Basal cisterns are not effaced.  No evidence of acute intracranial hemorrhage.  No depressed skull fractures.  Visualized paranasal sinuses and mastoid air cells are not opacified.  IMPRESSION: Small age indeterminate lacunar infarct in the left thalamus.  No acute intracranial hemorrhage.   Original Report Authenticated By: Burman Nieves, M.D.    Assessment: 47 y.o. male with acute onset of right facial weakness and slurred speech, most likely due to small left subcortical MCA ischemic stroke.  Stroke Risk Factors - hyperlipidemia and hypertension  Plan: 1. HgbA1c, fasting lipid panel 2. MRI, MRA  of the brain without contrast 3. Speech consult 4. Echocardiogram 5. Carotid dopplers 6. Prophylactic therapy-Antiplatelet med: Plavix 75 mg per day 7. Risk factor modification 8. Telemetry monitoring   C.R. Roseanne Reno, MD [facility]  01/01/2013, 11:43 PM

## 2013-01-01 NOTE — Plan of Care (Cosign Needed)
CTSP around 10pm re: new right facial drooping with slurred speech. Pt apparently had noticed these sx's when his cardiologist ws in the room around 6 pm but did not inform him of these sx's-finally told RN jjst before 10pm. Upon examination pt with obvious right facial drooping and tongue deviated to left, speech slurred. Also subtle right arm extensor resistance reduced 5-/5. A AND O x 3. Sent for stat CT Head- only showed Small age indeterminate lacunar infarct in the left thalamus. No acute intracranial hemorrhage. Dr. Roseanne Reno with Neurology consulted. MRI/MRA ordered. No AF and just had ECHO in April 2014- does have moderate to severe systolic dysfunction. Will order carotid dopplers. Already on low dose ASA-not on statin - check FLP  Junious Silk, ANP

## 2013-01-02 ENCOUNTER — Inpatient Hospital Stay (HOSPITAL_COMMUNITY): Payer: Medicaid Other

## 2013-01-02 DIAGNOSIS — I369 Nonrheumatic tricuspid valve disorder, unspecified: Secondary | ICD-10-CM

## 2013-01-02 DIAGNOSIS — I6350 Cerebral infarction due to unspecified occlusion or stenosis of unspecified cerebral artery: Secondary | ICD-10-CM

## 2013-01-02 LAB — CBC
HCT: 32.2 % — ABNORMAL LOW (ref 39.0–52.0)
Hemoglobin: 9.6 g/dL — ABNORMAL LOW (ref 13.0–17.0)
MCH: 21.5 pg — ABNORMAL LOW (ref 26.0–34.0)
MCHC: 29.8 g/dL — ABNORMAL LOW (ref 30.0–36.0)
MCV: 72.2 fL — ABNORMAL LOW (ref 78.0–100.0)
Platelets: 266 10*3/uL (ref 150–400)
RBC: 4.46 MIL/uL (ref 4.22–5.81)
RDW: 18 % — ABNORMAL HIGH (ref 11.5–15.5)
WBC: 4.4 10*3/uL (ref 4.0–10.5)

## 2013-01-02 LAB — CREATININE, SERUM
Creatinine, Ser: 2.59 mg/dL — ABNORMAL HIGH (ref 0.50–1.35)
GFR calc Af Amer: 33 mL/min — ABNORMAL LOW
GFR calc non Af Amer: 28 mL/min — ABNORMAL LOW

## 2013-01-02 LAB — LIPID PANEL
Cholesterol: 101 mg/dL (ref 0–200)
HDL: 39 mg/dL — ABNORMAL LOW
LDL Cholesterol: 54 mg/dL (ref 0–99)
Total CHOL/HDL Ratio: 2.6 ratio
Triglycerides: 38 mg/dL
VLDL: 8 mg/dL (ref 0–40)

## 2013-01-02 LAB — IRON AND TIBC
Iron: 25 ug/dL — ABNORMAL LOW (ref 42–135)
Saturation Ratios: 6 % — ABNORMAL LOW (ref 20–55)
TIBC: 415 ug/dL (ref 215–435)
UIBC: 390 ug/dL (ref 125–400)

## 2013-01-02 MED ORDER — CLOPIDOGREL BISULFATE 75 MG PO TABS
75.0000 mg | ORAL_TABLET | Freq: Every day | ORAL | Status: DC
  Administered 2013-01-02 – 2013-01-07 (×6): 75 mg via ORAL
  Filled 2013-01-02 (×6): qty 1

## 2013-01-02 NOTE — Progress Notes (Signed)
Bilateral carotid artery duplex:  1-39% ICA stenosis.  Vertebral artery flow is antegrade.     

## 2013-01-02 NOTE — Progress Notes (Signed)
TEE scheduled for Friday 9am. Dr Herbie Baltimore.  Corine Shelter PA-C 01/02/2013 3:20 PM

## 2013-01-02 NOTE — Progress Notes (Signed)
Agree - no clear source.

## 2013-01-02 NOTE — Progress Notes (Signed)
During rounds, pt told writer that he was worried that he had developed slurred speech.  Pt stated that this slurred speech started around 6 pm tonight when Dr. Herbie Baltimore was seeing him.  Pt stated that he did not tell Dr. Herbie Baltimore because he just started the slurred speech and thought it would "go away".  Writer assessed pt and noted pt to have right droop to mouth with some drooling to right side of mouth.  Pt stated that he felt "fine" and was in no pain.  Pt had slight weakness to right arm; no other facial drooping, right leg strength same to left.  Pt stated he could see fine, had no headache or cognitive difficulties, and was ambulating to BR. Writer paged on call NP, who ordered CT and came to assess pt. Monitoring to continue.

## 2013-01-02 NOTE — Progress Notes (Addendum)
Subjective:  No SOB. Slurred speech secondary to new CVA last pm.  Objective:  Vital Signs in the last 24 hours: Temp:  [98 F (36.7 C)-98.6 F (37 C)] 98.1 F (36.7 C) (09/10 0910) Pulse Rate:  [74-89] 89 (09/10 0910) Resp:  [18] 18 (09/10 0910) BP: (108-131)/(67-91) 131/91 mmHg (09/10 0910) SpO2:  [96 %-100 %] 100 % (09/10 0910) Weight:  [282 lb 6.6 oz (128.1 kg)] 282 lb 6.6 oz (128.1 kg) (09/10 0509)  Intake/Output from previous day:  Intake/Output Summary (Last 24 hours) at 01/02/13 1308 Last data filed at 01/02/13 1241  Gross per 24 hour  Intake   1350 ml  Output   3625 ml  Net  -2275 ml    Physical Exam: General appearance: alert, cooperative and no distress Lungs: clear to auscultation bilaterally Heart: regular rate and rhythm Extremities: 1+ edema   Rate: 88  Rhythm: normal sinus rhythm  Lab Results:  Recent Labs  01/02/13 0400  WBC 4.4  HGB 9.6*  PLT 266    Recent Labs  12/31/12 0448 01/01/13 0504 01/02/13 0400  NA 139 136  --   K 3.8 3.9  --   CL 103 99  --   CO2 29 31  --   GLUCOSE 121* 115*  --   BUN 21 26*  --   CREATININE 2.24* 2.34* 2.59*   No results found for this basename: TROPONINI, CK, MB,  in the last 72 hours No results found for this basename: INR,  in the last 72 hours  Imaging: Imaging results have been reviewed  Cardiac Studies:  Assessment/Plan:   Principal Problem:   Acute on chronic combined systolic and diastolic congestive heart failure Active Problems:   Acute on chronic renal insufficiency   CVA (cerebral infarction)- symptoms 01/01/13- MRI pending   Non-ischemic cardiomyopathy - EF 30-35% 2D April 2014   Anemia   CKD (chronic kidney disease), stage III   Cardiorenal syndrome with renal failure   Diastolic dysfunction- grade 2 by echo April 2014   S/P cardiac catheterization, 01/21/11 with normal coronary arteries   Hypokalemia   H/O noncompliance with medical treatment, presenting hazards to health   OSA  (obstructive sleep apnea) - supposed to be on CPAP   Accelerated essential hypertension  PLAN: MRI confirmed CVA, possibly embolic. ? Does he need a TEE, (a TTE has been ordered).  His SCR continues to drift up, he is off Lasix and Zaroxolyn. Watch for hyperkalemia as he is on Aldactone and K+ supplement.  Will add Plavix per Neuro recommendations. Dr Herbie Baltimore to see and will comment on diuretics.   Corine Shelter PA-C Beeper [phone] 01/02/2013, 1:08 PM  I have seen and evaluated the patient this PM along with Corine Shelter, PA. I agree with his findings, examination as well as impression recommendations.  I saw him @ ~5:30 last PM & he had just woken up.  Was a bit groggy, but as we talked more, & he woke up, he did not seem to have any speech abnormalitiy & definitely no facial droop.  He says that ~1 hr after my visit, he was on the phone with his daughter, who noticed his speech was slurred. Unfortunately, he did not mention it to the RN @ that time.  By the time he did, it was past 9pm.   Thankfully, he was seen by [facility] & the [facility] St[facility] Team, but we we not notified.  I did not hear about this until after 12pm today.  I saw him in conjunction with Dr. Amada Jupiter from Neurology - essentially only the facial droop & slurred speech on neuro exam.    Echo with EF ~30-35% & Grade III diastolic dysfunction, paradoxical septal motion.  Dilated Artriae. Elevated PA/RVP.  TEE currently scheduled for Friday AM - Dr. Rennis Golden. =-- to look for cardio-embolic source of what appears to have been a "shower of emboli".  It would appear that warfarin may be the Rx of choice given his low EF -- will await final recommendations from Neurology.  Lipid profile looks pretty good, but I have no problem with statin for risk reduction  From a CHF standpoint - we may have reached an end point for diuresis.  Have stopped IV Lasix & Metolazone since renal function has taken a turn for the worse.  Will allow for today&  tomorrow AM to equilibrate.  Would consider converting to PO Lasix for tomorrow PM.  BP stable - will not further increase doses of meds to allow for CVA perfusion.  Will need to follow CHF diuresis - Daily weights are clearly not consistent with amount of diuresis - we are @ 19L out.  In setting of CVA, am not sure how helpful ProBNP will be.  Plan is to transfer to [facility] for continued care - will need to go over for TEE anyway.  MD Time with pt: 25 min  HARDING,DAVID W, M.D., M.S. [facility] [address] [place], [place]  [address]  [phone] Pager # [phone] 01/02/2013 5:12 PM

## 2013-01-02 NOTE — Progress Notes (Signed)
*  PRELIMINARY RESULTS* Echocardiogram 2D Echocardiogram has been performed.  Devon Ortiz 01/02/2013, 2:34 PM

## 2013-01-02 NOTE — Progress Notes (Addendum)
NEURO HOSPITALIST PROGRESS NOTE   SUBJECTIVE:                                                                                                                        Patient continues to have dysarthria but no events over night. Awaiting MRI.  OBJECTIVE:                                                                                                                           Vital signs in last 24 hours: Temp:  [98 F (36.7 C)-98.6 F (37 C)] 98.6 F (37 C) (09/10 0509) Pulse Rate:  [74-81] 81 (09/10 0509) Resp:  [18] 18 (09/10 0509) BP: (108-126)/(67-90) 113/69 mmHg (09/10 0509) SpO2:  [96 %-100 %] 100 % (09/10 0509) Weight:  [128.1 kg (282 lb 6.6 oz)] 128.1 kg (282 lb 6.6 oz) (09/10 0509)  Intake/Output from previous day: 09/09 0701 - 09/10 0700 In: 1000 [P.O.:1000] Out: 3600 [Urine:3600] Intake/Output this shift:   Nutritional status: Cardiac  Past Medical History  Diagnosis Date  . Non-ischemic cardiomyopathy 12/2010    2D echo - EF 45-50, Grade 1 D Dysfxn; CATH with no CAD  . Abnormal echocardiogram 07/2012    EF 30-35%, mod Conc LVH; Grade 2 D Dysfunction (Pseudonormal), Mod MR/TR, PAP ~58 mmHg; Mod LA dilation  . S/P cardiac catheterization [phone]    Myoview with ? Inferior Ischemia: -- CATH- Large, draping coronary arteries, No CAD.  Marland Kitchen Chronic combined systolic and diastolic HF (heart failure), NYHA class 2 01/21/11  . Hypertension with target organ involvement     Cardiomyopathy, CKD III  . Morbid obesity with BMI of 40.0-44.9, adult   . OSA (obstructive sleep apnea)     on C-pap, sleep study was performed 03/10/09, REM sleep 86 minutes  . 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 History of hypokalemia   . H/O noncompliance with medical treatment, presenting hazards to health 12/26/2012     Neurologic Exam:   Mental Status: Alert, oriented, thought content appropriate.  Speech dysarthric without  evidence of aphasia.  Able to follow 3 step commands without difficulty. Cranial Nerves: II: Discs flat bilaterally; Visual fields grossly normal, pupils equal, round, reactive to light and  accommodation III,IV, VI: ptosis not present, extra-ocular motions intact bilaterally V,VII: face asymmetric on the right, facial light touch sensation normal bilaterally VIII: hearing normal bilaterally IX,X: gag reflex present XI: bilateral shoulder shrug XII: midline tongue extension Motor: Right : Upper extremity   5/5    Left:     Upper extremity   5/5  Lower extremity   5/5     Lower extremity   5/5 Tone and bulk:normal tone throughout; no atrophy noted Sensory: Pinprick and light touch intact throughout, bilaterally Deep Tendon Reflexes:  Right: Upper Extremity   Left: Upper extremity   biceps (C-5 to C-6) 2/4   biceps (C-5 to C-6) 2/4 tricep (C7) 2/4    triceps (C7) 2/4 Brachioradialis (C6) 2/4  Brachioradialis (C6) 2/4  Lower Extremity Lower Extremity  quadriceps (L-2 to L-4) 2/4   quadriceps (L-2 to L-4) 2/4 Achilles (S1) 2/4   Achilles (S1) 2/4  Plantars: Right: downgoing   Left: downgoing Cerebellar: normal finger-to-nose,  normal heel-to-shin test CV: pulses palpable throughout   Lab Results: Lab Results  Component Value Date/Time   CHOL 101 01/02/2013  4:00 AM   Lipid Panel  Recent Labs  01/02/13 0400  CHOL 101  TRIG 38  HDL 39*  CHOLHDL 2.6  VLDL 8  LDLCALC 54    Studies/Results: Ct Head Wo Contrast  01/01/2013   *RADIOLOGY REPORT*  Clinical Data: Right facial droop with new onset slurred speech.  CT HEAD WITHOUT CONTRAST  Technique:  Contiguous axial images were obtained from the base of the skull through the vertex without contrast.  Comparison: None.  Findings: There is a small area of focal low attenuation change in the left thalamus suggesting lacunar infarct, age indeterminate. The ventricles and sulci are symmetrical.  No mass effect or midline shift.  No  abnormal extra-axial fluid collections.  Wallace Cullens- white matter junctions are distinct.  Basal cisterns are not effaced.  No evidence of acute intracranial hemorrhage.  No depressed skull fractures.  Visualized paranasal sinuses and mastoid air cells are not opacified.  IMPRESSION: Small age indeterminate lacunar infarct in the left thalamus.  No acute intracranial hemorrhage.   Original Report Authenticated By: Burman Nieves, M.D.   Carotid doppler: Bilateral carotid artery duplex: 1-39% ICA stenosis. Vertebral artery flow is antegrade  MEDICATIONS                                                                                                                        Scheduled: . amLODipine  10 mg Oral Daily  . aspirin EC  81 mg Oral Daily  . carvedilol  25 mg Oral BID WC  . enoxaparin (LOVENOX) injection  60 mg Subcutaneous Q24H  . hydrALAZINE  37.5 mg Oral BID  . influenza vac split quadrivalent PF  0.5 mL Intramuscular Tomorrow-1000  . isosorbide mononitrate  30 mg Oral Daily  . multivitamin with minerals  1 tablet Oral Daily  . pantoprazole  40 mg Oral Daily  . potassium  chloride SA  40 mEq Oral TID  . sodium chloride  3 mL Intravenous Q12H  . spironolactone  25 mg Oral Daily    ASSESSMENT/PLAN:                                                                                                             47 y.o. male with acute onset of right facial weakness and slurred speech, most likely due to small left subcortical MCA ischemic stroke. LDL WNL, carotid dopplers WNL.  2 D echo and A1c pending.   Recommend:  1. MRI, MRA of the brain without contrast  2. Speech consult 3. Prophylactic therapy-Antiplatelet med: Plavix 75 mg per day  4. Risk factor modification  5. Telemetry monitoring  Assessment and plan discussed with with attending physician and they are in agreement.    Felicie Morn PA-C [facility] [phone]  01/02/2013, 9:42 AM   Bilateral likely cardioembolic  strokes. He has a history of cardiomyopathy and likely will need long term anticoagulation.    I have seen and evaluated the patient. I have reviewed the above note and made appropriate changes.   Ritta Slot, MD [facility] [phone]  If 7pm- 7am, please page neurology on call at [phone].

## 2013-01-02 NOTE — Progress Notes (Signed)
Pt has home machine in room. Pt encouraged to call RT if needing assistance placing mask on. RN aware. Pt in no distress.

## 2013-01-03 MED ORDER — HYDRALAZINE HCL 25 MG PO TABS
37.5000 mg | ORAL_TABLET | Freq: Three times a day (TID) | ORAL | Status: DC
  Administered 2013-01-03: 37.5 mg via ORAL
  Filled 2013-01-03 (×2): qty 1.5

## 2013-01-03 MED ORDER — SPIRONOLACTONE 12.5 MG HALF TABLET
12.5000 mg | ORAL_TABLET | Freq: Every day | ORAL | Status: DC
  Administered 2013-01-04 – 2013-01-07 (×4): 12.5 mg via ORAL
  Filled 2013-01-03 (×4): qty 1

## 2013-01-03 NOTE — Progress Notes (Signed)
Stroke Team Progress Note  HISTORY Devon Ortiz is an 47 y.o. male with a history of systolic and diastolic heart failure, hypertension, obesity, obstructive sleep apnea and chronic renal insufficiency, admitted on 12/26/2012 for exacerbation of heart failure. Patient developed acute onset of right facial weakness and slurred speech at around 5 PM today. He did not inform nursing staff until about 10 PM tonight. There is no previous history of stroke nor TIA. Patient has been on aspirin daily for antiplatelet therapy. CT scan of his head showed an old small left basal ganglia lacunar type infarction. No acute changes were noted. NIH stroke score was 3.   LSN: 5 PM on 01/01/2013  tPA Given: No: Mild focal deficits; beyond time window for treatment consideration.  MRankin: 0   He was admitted for further evaluation and treatment.   SUBJECTIVE  Patient feels like his speech is much better. Family concurs.    OBJECTIVE Most recent Vital Signs: Filed Vitals:   01/02/13 1842 01/02/13 2232 01/02/13 2343 01/03/13 0508  BP: 121/88 124/84 102/60 102/65  Pulse: 86 74 78 79  Temp: 98.5 F (36.9 C) 98.1 F (36.7 C) 99.2 F (37.3 C) 99.5 F (37.5 C)  TempSrc: Oral Oral Oral Oral  Resp:  18 18 18   Height: 5\' 8"  (1.727 m)     Weight: 116.393 kg (256 lb 9.6 oz)   115.894 kg (255 lb 8 oz)  SpO2: 97% 100% 94% 90%   CBG (last 3)  No results found for this basename: GLUCAP,  in the last 72 hours  IV Fluid Intake:     MEDICATIONS  . amLODipine  10 mg Oral Daily  . aspirin EC  81 mg Oral Daily  . carvedilol  25 mg Oral BID WC  . clopidogrel  75 mg Oral Q breakfast  . enoxaparin (LOVENOX) injection  60 mg Subcutaneous Q24H  . hydrALAZINE  37.5 mg Oral BID  . isosorbide mononitrate  30 mg Oral Daily  . multivitamin with minerals  1 tablet Oral Daily  . pantoprazole  40 mg Oral Daily  . potassium chloride SA  40 mEq Oral TID  . sodium chloride  3 mL Intravenous Q12H  . spironolactone  25  mg Oral Daily   PRN:  acetaminophen, albuterol, guaiFENesin, labetalol, ondansetron (ZOFRAN) IV, sodium chloride, zolpidem  Diet:  Cardiac thin liquids Activity:  Up as tolerated DVT Prophylaxis:  lovenox  CLINICALLY SIGNIFICANT STUDIES Basic Metabolic Panel:  Recent Labs Lab 12/27/12 1406  12/30/12 0550 12/31/12 0448 01/01/13 0504 01/02/13 0400  NA  --   < > 140 139 136  --   K 3.4*  < > 3.9 3.8 3.9  --   CL  --   < > 105 103 99  --   CO2  --   < > 26 29 31   --   GLUCOSE  --   < > 113* 121* 115*  --   BUN  --   < > 20 21 26*  --   CREATININE  --   < > 2.20* 2.24* 2.34* 2.59*  CALCIUM  --   < > 8.4 8.7 9.1  --   MG 1.6  --  1.8  --   --   --   < > = values in this interval not displayed. Liver Function Tests: No results found for this basename: AST, ALT, ALKPHOS, BILITOT, PROT, ALBUMIN,  in the last 168 hours CBC:  Recent Labs Lab 12/28/12 0355 01/02/13 0400  WBC 4.7 4.4  HGB 9.1* 9.6*  HCT 29.9* 32.2*  MCV 72.9* 72.2*  PLT 255 266   Coagulation: No results found for this basename: LABPROT, INR,  in the last 168 hours Cardiac Enzymes: No results found for this basename: CKTOTAL, CKMB, CKMBINDEX, TROPONINI,  in the last 168 hours Urinalysis: No results found for this basename: COLORURINE, APPERANCEUR, LABSPEC, PHURINE, GLUCOSEU, HGBUR, BILIRUBINUR, KETONESUR, PROTEINUR, UROBILINOGEN, NITRITE, LEUKOCYTESUR,  in the last 168 hours Lipid Panel    Component Value Date/Time   CHOL 101 01/02/2013 0400   TRIG 38 01/02/2013 0400   HDL 39* 01/02/2013 0400   CHOLHDL 2.6 01/02/2013 0400   VLDL 8 01/02/2013 0400   LDLCALC 54 01/02/2013 0400   HgbA1C  Lab Results  Component Value Date   HGBA1C 6.5* 01/02/2013    Urine Drug Screen:   No results found for this basename: labopia, cocainscrnur, labbenz, amphetmu, thcu, labbarb    Alcohol Level: No results found for this basename: ETH,  in the last 168 hours  Ct Head Wo Contrast 01/01/2013 Small age indeterminate lacunar infarct in  the left thalamus.  No acute intracranial hemorrhage.     Mr Devon Ortiz Head Wo Contrast 01/02/2013  Anterior circulation without evidence of large or medium sized vessel significant stenosis or occlusion.  Ectatic supraclinoid internal carotid artery greater on the right.  Middle cerebral artery and A2 segment anterior cerebral artery branch vessel irregularity and narrowing.  Right vertebral artery is dominant.  Ectatic vertebral arteries and the basilar artery.  Nonvisualization left PICA.  Mild narrowing proximal and distal right PICA.  Duplicated left AICA with mild narrowing of the AICA branches bilaterally.  No high-grade stenosis of the basilar artery.  Superior cerebellar artery mid to distal narrowing irregularity more notable on the right.  Mild irregularity distal posterior cerebral artery branches.  No aneurysm detected.  Mr Brain Wo Contrast 01/02/2013   Scattered acute small non hemorrhagic infarcts frontal lobes, right anterior sub insular region and left parietal lobe with largest infarct mid left corona radiata measuring up to 1 cm.  Question embolic disease?  Moderate white matter type changes which in the present clinical setting is most consistent with result of small vessel disease type changes.  Remote small left thalamic infarct.    2D Echocardiogram  EF 30%, LA mild-mod dilated. No ASD or PFO.  Carotid Doppler  Bilateral carotid artery duplex: 1-39% ICA stenosis. Vertebral artery flow is antegrade.   CXR  12/26/2012 Cardiomegaly with pulmonary edema, small right-sided effusion  and bibasilar atelectasis. Improved aeration of the left lower lung.    EKG  normal sinus rhythm.   Therapy Recommendations ORDERED  Physical Exam   Patient not examined  As not in room x 2 when attempted to see x 2 ASSESSMENT Mr. Devon Ortiz is a 47 y.o. male presenting with acute on chronic systolic/diastolic heart failure on 12/26/2012. On 01/01/2013 ~6pm patient began with right facial droop and  slurred speech but did not alert the nurse until 10 pm per records. AT that point, hospitalist team evaluated team, consulted neurology. Imaging confirms scattered acute small non hemorrhagic infarcts frontal lobes, right anterior sub insular region and left parietal lobe with largest  infarct mid left corona radiata measuring up to 1 cm. Also noted is a small left thalamic infarct. Infarct felt to be embolic secondary to unknown source.  On aspirin 81 mg orally every day prior to admission. Now on aspirin 81 mg orally every day and clopidogrel 75  mg orally every day for secondary stroke prevention. Patient with resultant dysarthria. Work up underway.   Non-ischemic cardiomyopathy, EF 30% (no stents, normal coronary arteries 12/2010)  Acute on chronic renal insufficiency  OSA  Accelerated hypertension  CKD, III  Anemia   Hospital day # 8  TREATMENT/PLAN  Continue aspirin 81 mg orally every day and clopidogrel 75 mg orally every day for secondary stroke prevention at this point until review of TEE results. No coronary stents, normal cors 07/2010.  TEE am. IF PFO, will need LE Dopplers to rule out DVT source.  Risk factor modification  Gwendolyn Lima. Manson Passey, [facility], MBA, MHA [facility] Pager: [phone] 01/03/2013 4:40 PM  I have personally obtained a history, examined the patient, evaluated imaging results, and formulated the assessment and plan of care. I agree with the above. Delia Heady, MD

## 2013-01-03 NOTE — Progress Notes (Signed)
     Please see other note this note not done.

## 2013-01-03 NOTE — Progress Notes (Signed)
Went to check on pt CPAP, pt already off CPAP this morning. NO distress noted.

## 2013-01-03 NOTE — Progress Notes (Signed)
Subjective: Mild slurred speech, but no complaints.   Objective: Vital signs in last 24 hours: Temp:  [98.1 F (36.7 C)-99.5 F (37.5 C)] 99.5 F (37.5 C) (09/11 0508) Pulse Rate:  [73-86] 79 (09/11 0508) Resp:  [18] 18 (09/11 0508) BP: (102-132)/(60-88) 102/65 mmHg (09/11 0508) SpO2:  [90 %-100 %] 90 % (09/11 0508) Weight:  [255 lb 8 oz (115.894 kg)-256 lb 9.6 oz (116.393 kg)] 255 lb 8 oz (115.894 kg) (09/11 0508) Weight change: -25 lb 13 oz (-11.707 kg) Last BM Date: 01/02/13 Intake/Output from previous day: wt 255.8 down from 294 at peak - 1135 (since admit -20,020) 09/10 0701 - 09/11 0700 In: 1190 [P.O.:1190] Out: 2325 [Urine:2325] Intake/Output this shift:    PE: General:Pleasant affect, NAD Skin:Warm and dry, brisk capillary refill HEENT:normocephalic, sclera clear, mucus membranes moist Heart:S1S2 RRR without murmur, gallup, rub or click Lungs:clear without rales, rhonchi, or wheezes ZOX:WRUEA, soft, non tender, + BS, do not palpate liver spleen or masses Ext:no lower ext edema though legs mildly tight on exam, but much improved Neuro:alert and oriented, MAE, follows commands, mild rt lip droop with smile,  + facial symmetry with frown.  Equal upper ext. Grips, push, and equal strength of lower ext.  Mild slurred speech.   Lab Results:  Recent Labs  01/02/13 0400  WBC 4.4  HGB 9.6*  HCT 32.2*  PLT 266   BMET  Recent Labs  01/01/13 0504 01/02/13 0400  NA 136  --   K 3.9  --   CL 99  --   CO2 31  --   GLUCOSE 115*  --   BUN 26*  --   CREATININE 2.34* 2.59*  CALCIUM 9.1  --    No results found for this basename: TROPONINI, CK, MB,  in the last 72 hours  Lab Results  Component Value Date   CHOL 101 01/02/2013   HDL 39* 01/02/2013   LDLCALC 54 01/02/2013   TRIG 38 01/02/2013   CHOLHDL 2.6 01/02/2013   Lab Results  Component Value Date   HGBA1C 6.5* 01/02/2013     Lab Results  Component Value Date   TSH 0.673 07/24/2012    Hepatic Function  Panel No results found for this basename: PROT, ALBUMIN, AST, ALT, ALKPHOS, BILITOT, BILIDIR, IBILI,  in the last 72 hours  Recent Labs  01/02/13 0400  CHOL 101   No results found for this basename: PROTIME,  in the last 72 hours  BNP (last 3 results)  Recent Labs  12/27/12 0333 12/30/12 0550 12/31/12 0448  PROBNP 3497.0* 1682.0* 1543.0*      EKG: Orders placed during the hospital encounter of 12/26/12  . ED EKG    Studies/Results: Ct Head Wo Contrast  01/01/2013   *RADIOLOGY REPORT*  Clinical Data: Right facial droop with new onset slurred speech.  CT HEAD WITHOUT CONTRAST  Technique:  Contiguous axial images were obtained from the base of the skull through the vertex without contrast.  Comparison: None.  Findings: There is a small area of focal low attenuation change in the left thalamus suggesting lacunar infarct, age indeterminate. The ventricles and sulci are symmetrical.  No mass effect or midline shift.  No abnormal extra-axial fluid collections.  Wallace Cullens- white matter junctions are distinct.  Basal cisterns are not effaced.  No evidence of acute intracranial hemorrhage.  No depressed skull fractures.  Visualized paranasal sinuses and mastoid air cells are not opacified.  IMPRESSION: Small age indeterminate lacunar infarct in  the left thalamus.  No acute intracranial hemorrhage.   Original Report Authenticated By: Burman Nieves, M.D.   Mr [facility] Wo Contrast  01/02/2013   *RADIOLOGY REPORT*  Clinical Data:  Slurred speech.  Right facial droop.  Hypertension. Morbid obesity.  MRI BRAIN WITHOUT CONTRAST MRA HEAD WITHOUT CONTRAST  Technique: Multiplanar, multiecho pulse sequences of the brain and surrounding structures were obtained according to standard protocol without intravenous contrast.  Angiographic images of the head were obtained using MRA technique without contrast.  Comparison: 01/01/2013 CT.  No comparison MR.  MRI HEAD  Findings:  Scattered acute small non hemorrhagic  infarcts frontal lobes, right anterior sub insular region and left parietal lobe with largest infarct mid left corona radiata measuring up to 1 cm. Question embolic disease?  Tiny regions of blood breakdown products right temporal lobe probably represent result of prior hemorrhagic ischemia.  Moderate white matter type changes which in the present clinical setting is most consistent with result of small vessel disease type changes.  Remote small left thalamic infarct.  No hydrocephalus.  No intracranial mass lesion detected on this unenhanced exam.  Ectatic vertebral artery and basilar artery with superior displacement of the hypothalamic region.  Exophthalmos.  Symmetric enlarged parotid glands.  Transverse ligament hypertrophy.  IMPRESSION: Scattered acute small non hemorrhagic infarcts frontal lobes, right anterior sub insular region and left parietal lobe with largest infarct mid left corona radiata measuring up to 1 cm.  Question embolic disease?  Moderate white matter type changes which in the present clinical setting is most consistent with result of small vessel disease type changes.  Remote small left thalamic infarct.  Please see above.  MRA HEAD  Findings: Anterior circulation without evidence of large or medium sized vessel significant stenosis or occlusion.  Ectatic supraclinoid internal carotid artery greater on the right.  Middle cerebral artery and A2 segment anterior cerebral artery branch vessel irregularity and narrowing.  Right vertebral artery is dominant.  Ectatic vertebral arteries and the basilar artery.  Nonvisualization left PICA.  Mild narrowing proximal and distal right PICA.  Duplicated left AICA with mild narrowing of the AICA branches bilaterally.  No high-grade stenosis of the basilar artery.  Superior cerebellar artery mid to distal narrowing irregularity more notable on the right.  Mild irregularity distal posterior cerebral artery branches.  No aneurysm detected.  IMPRESSION:  Ectatic intracranial vasculature with branch vessel irregularity as detailed above.  This has been made a PRA call report utilizing dashboard call feature.   Original Report Authenticated By: Lacy Duverney, M.D.   Mr Brain Wo Contrast  01/02/2013   *RADIOLOGY REPORT*  Clinical Data:  Slurred speech.  Right facial droop.  Hypertension. Morbid obesity.  MRI BRAIN WITHOUT CONTRAST MRA HEAD WITHOUT CONTRAST  Technique: Multiplanar, multiecho pulse sequences of the brain and surrounding structures were obtained according to standard protocol without intravenous contrast.  Angiographic images of the head were obtained using MRA technique without contrast.  Comparison: 01/01/2013 CT.  No comparison MR.  MRI HEAD  Findings:  Scattered acute small non hemorrhagic infarcts frontal lobes, right anterior sub insular region and left parietal lobe with largest infarct mid left corona radiata measuring up to 1 cm. Question embolic disease?  Tiny regions of blood breakdown products right temporal lobe probably represent result of prior hemorrhagic ischemia.  Moderate white matter type changes which in the present clinical setting is most consistent with result of small vessel disease type changes.  Remote small left thalamic infarct.  No  hydrocephalus.  No intracranial mass lesion detected on this unenhanced exam.  Ectatic vertebral artery and basilar artery with superior displacement of the hypothalamic region.  Exophthalmos.  Symmetric enlarged parotid glands.  Transverse ligament hypertrophy.  IMPRESSION: Scattered acute small non hemorrhagic infarcts frontal lobes, right anterior sub insular region and left parietal lobe with largest infarct mid left corona radiata measuring up to 1 cm.  Question embolic disease?  Moderate white matter type changes which in the present clinical setting is most consistent with result of small vessel disease type changes.  Remote small left thalamic infarct.  Please see above.  MRA HEAD   Findings: Anterior circulation without evidence of large or medium sized vessel significant stenosis or occlusion.  Ectatic supraclinoid internal carotid artery greater on the right.  Middle cerebral artery and A2 segment anterior cerebral artery branch vessel irregularity and narrowing.  Right vertebral artery is dominant.  Ectatic vertebral arteries and the basilar artery.  Nonvisualization left PICA.  Mild narrowing proximal and distal right PICA.  Duplicated left AICA with mild narrowing of the AICA branches bilaterally.  No high-grade stenosis of the basilar artery.  Superior cerebellar artery mid to distal narrowing irregularity more notable on the right.  Mild irregularity distal posterior cerebral artery branches.  No aneurysm detected.  IMPRESSION: Ectatic intracranial vasculature with branch vessel irregularity as detailed above.  This has been made a PRA call report utilizing dashboard call feature.   Original Report Authenticated By: Lacy Duverney, M.D.    Medications: I have reviewed the patient's current medications. Scheduled Meds: . amLODipine  10 mg Oral Daily  . aspirin EC  81 mg Oral Daily  . carvedilol  25 mg Oral BID WC  . clopidogrel  75 mg Oral Q breakfast  . enoxaparin (LOVENOX) injection  60 mg Subcutaneous Q24H  . hydrALAZINE  37.5 mg Oral BID  . isosorbide mononitrate  30 mg Oral Daily  . multivitamin with minerals  1 tablet Oral Daily  . pantoprazole  40 mg Oral Daily  . potassium chloride SA  40 mEq Oral TID  . sodium chloride  3 mL Intravenous Q12H  . spironolactone  25 mg Oral Daily   Continuous Infusions:  PRN Meds:.acetaminophen, albuterol, guaiFENesin, labetalol, ondansetron (ZOFRAN) IV, sodium chloride, zolpidem  Assessment/Plan: Principal Problem:   Acute on chronic combined systolic and diastolic congestive heart failure Active Problems:   CVA (cerebral infarction)- symptoms 01/01/13- scattered sm. embolic infarcts   Non-ischemic cardiomyopathy - EF 30-35%  2D April 2014   Acute on chronic renal insufficiency   S/P cardiac catheterization, 01/21/11 with normal coronary arteries   Hypokalemia   H/O noncompliance with medical treatment, presenting hazards to health   OSA (obstructive sleep apnea) - supposed to be on CPAP   Accelerated essential hypertension   Anemia   CKD (chronic kidney disease), stage III   Cardiorenal syndrome with renal failure   Diastolic dysfunction- grade 2 by echo April 2014  PLAN: MRI with Scattered acute small non hemorrhagic infarcts frontal lobes, right anterior sub insular region and left parietal lobe with largest infarct mid left corona radiata measuring up to 1 cm. Question embolic disease? For TEE tomorrow with Dr. Rennis Golden at 0800.  Continues with diuresing, diuretics stopped yesterday.  CKD III,  wth cr up to 2.59 from 2.09 on admit.   Mild anemia.  NOTE:  Pt has lifevest from April, though he has not been wearing.  EF continues to 30-35%.  This admit was not on meds  due to vomiting.  ? Proceed ICD vs. Begin wearing Life vest again.    LOS: 8 days   Time spent with pt. :20 minutes. [facility] R  Nurse Practitioner Certified Pager [phone] 01/03/2013, 10:01 AM     Patient seen and examined. Agree with assessment and plan. Feeling better, with mild residual speech slurring.  CVA most ikely secondary to embolic shower from LVD vs transient arrythmia..  EF 30 -35% on echo. With increasing Cr will decrease spironolactone and dc KCl supplement. F/U renal function. Will titrate hydralazine to tid.  Suspect will need warfarin, but defer initiation until after TEE and per neuro.   Lennette Bihari, MD, [facility] 01/03/2013 4:11 PM

## 2013-01-04 ENCOUNTER — Encounter (HOSPITAL_COMMUNITY)
Admission: EM | Disposition: A | Discharge status: Home Health | Payer: Self-pay | Source: Home / Self Care | Attending: Cardiology

## 2013-01-04 HISTORY — PX: TEE WITHOUT CARDIOVERSION: SHX5443

## 2013-01-04 LAB — BASIC METABOLIC PANEL
CO2: 26 mEq/L (ref 19–32)
Calcium: 9.2 mg/dL (ref 8.4–10.5)
Chloride: 98 mEq/L (ref 96–112)
GFR calc Af Amer: 41 mL/min — ABNORMAL LOW (ref >90)
Sodium: 136 mEq/L (ref 135–145)

## 2013-01-04 LAB — PRO B NATRIURETIC PEPTIDE: Pro B Natriuretic peptide (BNP): 884.6 pg/mL — ABNORMAL HIGH (ref 0–125)

## 2013-01-04 SURGERY — ECHOCARDIOGRAM, TRANSESOPHAGEAL
Anesthesia: Moderate Sedation

## 2013-01-04 SURGERY — ECHOCARDIOGRAM, TRANSESOPHAGEAL
Anesthesia: Monitor Anesthesia Care

## 2013-01-04 MED ORDER — BUTAMBEN-TETRACAINE-BENZOCAINE 2-2-14 % EX AERO
INHALATION_SPRAY | CUTANEOUS | Status: DC | PRN
  Administered 2013-01-04: 2 via TOPICAL

## 2013-01-04 MED ORDER — FENTANYL CITRATE 0.05 MG/ML IJ SOLN
INTRAMUSCULAR | Status: DC | PRN
  Administered 2013-01-04 (×3): 25 ug via INTRAVENOUS

## 2013-01-04 MED ORDER — HYDRALAZINE HCL 25 MG PO TABS
37.5000 mg | ORAL_TABLET | Freq: Two times a day (BID) | ORAL | Status: DC
  Administered 2013-01-04 – 2013-01-07 (×7): 37.5 mg via ORAL
  Filled 2013-01-04 (×9): qty 1.5

## 2013-01-04 MED ORDER — MIDAZOLAM HCL 10 MG/2ML IJ SOLN
INTRAMUSCULAR | Status: DC | PRN
  Administered 2013-01-04 (×4): 1 mg via INTRAVENOUS

## 2013-01-04 MED ORDER — SODIUM CHLORIDE 0.9 % IV SOLN: INTRAVENOUS | Status: DC

## 2013-01-04 NOTE — Progress Notes (Addendum)
Stroke Team Progress Note  HISTORY Devon Ortiz is an 47 y.o. male with a history of systolic and diastolic heart failure, hypertension, obesity, obstructive sleep apnea and chronic renal insufficiency, admitted on 12/26/2012 for exacerbation of heart failure. Patient developed acute onset of right facial weakness and slurred speech at around 5 PM today. He did not inform nursing staff until about 10 PM tonight. There is no previous history of stroke nor TIA. Patient has been on aspirin daily for antiplatelet therapy. CT scan of his head showed an old small left basal ganglia lacunar type infarction. No acute changes were noted. NIH stroke score was 3.   LSN: 5 PM on 01/01/2013  tPA Given: No: Mild focal deficits; beyond time window for treatment consideration.  MRankin: 0   He was admitted for further evaluation and treatment.   SUBJECTIVE Just coming back into room from TEE. Awake able to answer questions.  OBJECTIVE Most recent Vital Signs: Filed Vitals:   01/03/13 1535 01/03/13 2153 01/04/13 0551 01/04/13 0729  BP: 124/77 111/77 115/76 136/88  Pulse: 78 74 76 79  Temp: 98.3 F (36.8 C) 98.4 F (36.9 C) 98.2 F (36.8 C) 98.3 F (36.8 C)  TempSrc: Oral Oral Axillary Oral  Resp: 18   16  Height:      Weight:   114.941 kg (253 lb 6.4 oz)   SpO2: 96% 100% 96% 100%   CBG (last 3)  No results found for this basename: GLUCAP,  in the last 72 hours  IV Fluid Intake:     MEDICATIONS  . amLODipine  10 mg Oral Daily  . aspirin EC  81 mg Oral Daily  . carvedilol  25 mg Oral BID WC  . clopidogrel  75 mg Oral Q breakfast  . enoxaparin (LOVENOX) injection  60 mg Subcutaneous Q24H  . hydrALAZINE  37.5 mg Oral BID  . isosorbide mononitrate  30 mg Oral Daily  . multivitamin with minerals  1 tablet Oral Daily  . pantoprazole  40 mg Oral Daily  . sodium chloride  3 mL Intravenous Q12H  . spironolactone  12.5 mg Oral Daily   PRN:  acetaminophen, albuterol,  butamben-tetracaine-benzocaine, fentaNYL, guaiFENesin, labetalol, ondansetron (ZOFRAN) IV, sodium chloride, zolpidem  Diet:  NPO for procedure (thin liquids) Activity:  Up as tolerated DVT Prophylaxis:  lovenox  CLINICALLY SIGNIFICANT STUDIES Basic Metabolic Panel:   Recent Labs Lab 12/30/12 0550 12/31/12 0448 01/01/13 0504 01/02/13 0400  NA 140 139 136  --   K 3.9 3.8 3.9  --   CL 105 103 99  --   CO2 26 29 31   --   GLUCOSE 113* 121* 115*  --   BUN 20 21 26*  --   CREATININE 2.20* 2.24* 2.34* 2.59*  CALCIUM 8.4 8.7 9.1  --   MG 1.8  --   --   --    Liver Function Tests: No results found for this basename: AST, ALT, ALKPHOS, BILITOT, PROT, ALBUMIN,  in the last 168 hours CBC:   Recent Labs Lab 01/02/13 0400  WBC 4.4  HGB 9.6*  HCT 32.2*  MCV 72.2*  PLT 266   Coagulation: No results found for this basename: LABPROT, INR,  in the last 168 hours Cardiac Enzymes: No results found for this basename: CKTOTAL, CKMB, CKMBINDEX, TROPONINI,  in the last 168 hours Urinalysis: No results found for this basename: COLORURINE, APPERANCEUR, LABSPEC, PHURINE, GLUCOSEU, HGBUR, BILIRUBINUR, KETONESUR, PROTEINUR, UROBILINOGEN, NITRITE, LEUKOCYTESUR,  in the last 168 hours Lipid  Panel    Component Value Date/Time   CHOL 101 01/02/2013 0400   TRIG 38 01/02/2013 0400   HDL 39* 01/02/2013 0400   CHOLHDL 2.6 01/02/2013 0400   VLDL 8 01/02/2013 0400   LDLCALC 54 01/02/2013 0400   HgbA1C  Lab Results  Component Value Date   HGBA1C 6.5* 01/02/2013    Urine Drug Screen:   No results found for this basename: labopia,  cocainscrnur,  labbenz,  amphetmu,  thcu,  labbarb    Alcohol Level: No results found for this basename: ETH,  in the last 168 hours  Ct Head Wo Contrast 01/01/2013 Small age indeterminate lacunar infarct in the left thalamus.  No acute intracranial hemorrhage.     Devon Ortiz Head Wo Contrast 01/02/2013  Anterior circulation without evidence of large or medium sized vessel  significant stenosis or occlusion.  Ectatic supraclinoid internal carotid artery greater on the right.  Middle cerebral artery and A2 segment anterior cerebral artery branch vessel irregularity and narrowing.  Right vertebral artery is dominant.  Ectatic vertebral arteries and the basilar artery.  Nonvisualization left PICA.  Mild narrowing proximal and distal right PICA.  Duplicated left AICA with mild narrowing of the AICA branches bilaterally.  No high-grade stenosis of the basilar artery.  Superior cerebellar artery mid to distal narrowing irregularity more notable on the right.  Mild irregularity distal posterior cerebral artery branches.  No aneurysm detected.  Devon Brain Wo Contrast 01/02/2013   Scattered acute small non hemorrhagic infarcts frontal lobes, right anterior sub insular region and left parietal lobe with largest infarct mid left corona radiata measuring up to 1 cm.  Question embolic disease?  Moderate white matter type changes which in the present clinical setting is most consistent with result of small vessel disease type changes.  Remote small left thalamic infarct.    2D Echocardiogram  EF 30%, LA mild-mod dilated. No ASD or PFO.  Carotid Doppler  Bilateral carotid artery duplex: 1-39% ICA stenosis. Vertebral artery flow is antegrade.   CXR  12/26/2012 Cardiomegaly with pulmonary edema, small right-sided effusion  and bibasilar atelectasis. Improved aeration of the left lower lung.    EKG  normal sinus rhythm.   Therapy Recommendations  Physical Exam   Obese middle aged african [place] male not in distress.Awake alert. Afebrile. Head is nontraumatic. Neck is supple without bruit. Hearing is normal. Cardiac exam no murmur or gallop. Lungs are clear to auscultation. Distal pulses are well felt. Neurological Exam : Awake alert oriented x 3 normal speech and language. Mild left lower face asymmetry. Tongue midline. No drift. Mild diminished fine finger movements on left. Orbits  right over left upper extremity. Mild left grip weak.. Normal sensation . Normal coordination.  ASSESSMENT Devon Ortiz is a 47 y.o. male presenting with acute on chronic systolic/diastolic heart failure on 12/26/2012. On 01/01/2013 ~6pm patient began with right facial droop and slurred speech but did not alert the nurse until 10 pm per records. AT that point, hospitalist team evaluated team, consulted neurology. Imaging confirms scattered acute small non hemorrhagic infarcts frontal lobes, right anterior sub insular region and left parietal lobe with largest infarct mid left corona radiata measuring up to 1 cm. Also noted is a small left thalamic infarct. Infarct felt to be embolic secondary to unknown source.  On aspirin 81 mg orally every day prior to admission. Now on aspirin 81 mg orally every day and clopidogrel 75 mg orally every day for secondary stroke prevention. Patient with resultant  dysarthria. Work up underway.   Frontal lob, right anterior sub insular, left parietal, left thalamic infarcts (TEE no PFO)  Non-ischemic cardiomyopathy, EF 30% (no stents, normal coronary arteries 12/2010)  Acute on chronic renal insufficiency  OSA  Accelerated hypertension  CKD, III  Anemia  LDL 60, goal < 100, patient at goal therefore statin not indicated at this time.   Hospital day # 9  TREATMENT/PLAN  Change to plavix for secondary stroke prevention.  TEE reviewed. No PFO.  Risk factor modification  Have patient follow up with Dr. Pearlean Brownie in stroke clinic in 2 months.  Patient will need PT/OT evaluation prior to discharge to assure appropriate environment for discharge: return to prior venue versus CIR, home therapy. I did consult these teams for their recommendations.   Gwendolyn Lima. Manson Passey, [facility], MBA, MHA [facility] Pager: [phone] 01/04/2013 8:14 AM  I have personally obtained a history, examined the patient, evaluated imaging results, and formulated the  assessment and plan of care. I agree with the above.  Delia Heady, MD

## 2013-01-04 NOTE — Evaluation (Signed)
Physical Therapy One Time Evaluation Patient Details Name: Devon Ortiz MRN: [id number] DOB: 05-09-1965 Today's Date: 01/04/2013 Time: 1191-4782 PT Time Calculation (min): 10 min  PT Assessment / Plan / Recommendation History of Present Illness  47 y.o. male with a history of systolic and diastolic heart failure, hypertension, obesity, obstructive sleep apnea and chronic renal insufficiency, admitted on 12/26/2012 for exacerbation of heart failure. Patient developed acute onset of right facial weakness and slurred speech at around 5 PM today. He did not inform nursing staff until about 10 PM tonight. There is no previous history of stroke nor TIA. Patient has been on aspirin daily for antiplatelet therapy. CT scan of his head showed an old small left basal ganglia lacunar type infarction. No acute changes were noted. NIH stroke score was 3.    Clinical Impression  Patient evaluated by Physical Therapy with no further acute PT needs identified. All education has been completed and the patient has no further questions. No follow-up Physial Therapy or equipment needs. PT is signing off. Thank you for this referral.  Pt only presents with facial droop and speech deficits at this time however no extremities weakness, sensation changes, or balance and coordinations deficits identifed.  Screened for OT and no acute needs identified.       PT Assessment  Patent does not need any further PT services    Follow Up Recommendations  No PT follow up    Does the patient have the potential to tolerate intense rehabilitation      Barriers to Discharge        Equipment Recommendations  None recommended by PT    Recommendations for Other Services     Frequency      Precautions / Restrictions Precautions Precautions: None   Pertinent Vitals/Pain n/a      Mobility  Bed Mobility Bed Mobility: Supine to Sit Supine to Sit: 7: Independent Transfers Transfers: Sit to Stand;Stand to Sit Sit to  Stand: 6: Modified independent (Device/Increase time) Stand to Sit: 6: Modified independent (Device/Increase time) Ambulation/Gait Ambulation/Gait Assistance: 6: Modified independent (Device/Increase time) Ambulation Distance (Feet): 300 Feet Assistive device: None Ambulation/Gait Assistance Details: no unsteady gait or LOB observed Gait Pattern: Within Functional Limits Gait velocity: WFL General Gait Details: able to look around environment and turn around quickly without any deficits Modified Rankin (Stroke Patients Only) Pre-Morbid Rankin Score: No symptoms Modified Rankin: No significant disability (facial droop, speech difficulty)    Exercises     PT Diagnosis:    PT Problem List:   PT Treatment Interventions:       PT Goals(Current goals can be found in the care plan section) Acute Rehab PT Goals PT Goal Formulation: No goals set, d/c therapy  Visit Information  Last PT Received On: 01/04/13 Assistance Needed: +1 History of Present Illness: 47 y.o. male with a history of systolic and diastolic heart failure, hypertension, obesity, obstructive sleep apnea and chronic renal insufficiency, admitted on 12/26/2012 for exacerbation of heart failure. Patient developed acute onset of right facial weakness and slurred speech at around 5 PM today. He did not inform nursing staff until about 10 PM tonight. There is no previous history of stroke nor TIA. Patient has been on aspirin daily for antiplatelet therapy. CT scan of his head showed an old small left basal ganglia lacunar type infarction. No acute changes were noted. NIH stroke score was 3.         Prior Functioning  Home Living Family/patient expects to be discharged  to:: Private residence Living Arrangements: Alone Type of Home: House Home Access: Stairs to enter Secretary/administrator of Steps: 12 Entrance Stairs-Rails: Right Home Layout: Two level Alternate Level Stairs-Number of Steps: flight Home Equipment:  None Prior Function Level of Independence: Independent Communication Communication: No difficulties    Cognition  Cognition Arousal/Alertness: Awake/alert Behavior During Therapy: WFL for tasks assessed/performed Overall Cognitive Status: Within Functional Limits for tasks assessed    Extremity/Trunk Assessment Upper Extremity Assessment Upper Extremity Assessment: Overall WFL for tasks assessed Lower Extremity Assessment Lower Extremity Assessment: Overall WFL for tasks assessed   Balance    End of Session PT - End of Session Activity Tolerance: Patient tolerated treatment well Patient left: in bed;with call bell/phone within reach  GP     Jordi Kamm,KATHrine E 01/04/2013, 1:52 PM Zenovia Jarred, PT, DPT 01/04/2013 Pager: [phone]
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
## 2013-01-04 NOTE — Progress Notes (Signed)
  Echocardiogram Echocardiogram Transesophageal has been performed.  Georgian Co 01/04/2013, 9:47 AM

## 2013-01-04 NOTE — Progress Notes (Signed)
OT Cancellation Note  Patient Details Name: Devon Ortiz MRN: [id number] DOB: [birth year]   Cancelled Treatment:     Attempted to see for eval; however, pt working with SLP.  Will reattempt  Adalea Handler M 01/04/2013, 8:03 PM

## 2013-01-04 NOTE — CV Procedure (Signed)
   TRANSESOPHAGEAL ECHOCARDIOGRAM (TEE) NOTE  INDICATIONS: stroke, multi-infarct territory distribution, evaluate for source of embolus  PROCEDURE:   Informed consent was obtained prior to the procedure. The risks, benefits and alternatives for the procedure were discussed and the patient comprehended these risks.  Risks include, but are not limited to, cough, sore throat, vomiting, nausea, somnolence, esophageal and stomach trauma or perforation, bleeding, low blood pressure, aspiration, pneumonia, infection, trauma to the teeth and death.    After a procedural time-out, the patient was given 4 mg versed and 75 mcg fentanyl for moderate sedation.  The oropharynx was anesthetized 2 sprays of cetacaine.  The transesophageal probe was inserted in the esophagus and stomach without difficulty and multiple views were obtained.  The patient was kept under observation until the patient left the procedure room.  The patient left the procedure room in stable condition.   Agitated microbubble saline contrast was administered.  COMPLICATIONS:    There were no immediate complications.  Findings:  1. LEFT VENTRICLE: The left ventricular wall thickness is normal.  LV apical false tendon noted.The left ventricular cavity is dilated in size. Wall motion is severely globally hypokinetic.  LVEF is ~30%.  2. RIGHT VENTRICLE:  The right ventricle is normal in structure and function without any thrombus or masses.    3. LEFT ATRIUM:  The left atrium is dilated in size without any thrombus or masses.  There is not spontaneous echo contrast ("smoke") in the left atrium consistent with a low flow state.  4. LEFT ATRIAL APPENDAGE:  The left atrial appendage is free of any thrombus or masses. The appendage has single lobes. Pulse doppler indicates moderate flow in the appendage.  5. ATRIAL SEPTUM:  The atrial septum appears intact and is free of thrombus and/or masses.  There is no evidence for interatrial  shunting by color doppler and saline microbubble.  6. RIGHT ATRIUM:  The right atrium is normal in size and function without any thrombus or masses.  7. MITRAL VALVE:  The mitral valve is normal in structure and function with Mild regurgitation.  There were no vegetations or stenosis.  8. AORTIC VALVE:  The aortic valve is normal in structure and function with no regurgitation.  There were no vegetations or stenosis  9. TRICUSPID VALVE:  The tricuspid valve is normal in structure and function with Mild regurgitation.  There were no vegetations or stenosis  10.  PULMONIC VALVE:  The pulmonic valve is normal in structure and function with no regurgitation.  There were no vegetations or stenosis.   11. AORTIC ARCH, ASCENDING AND DESCENDING AORTA:  There was no atherosclerosis of the ascending aorta, aortic arch, or proximal descending aorta.  IMPRESSION:   1. No cardiac source of embolus identified. 2. Severe global hypokinesis, dilated ventricle, EF 30%. 3. Normal LAA without thrombus and high velocities, arguing against recent or paroxysmal atrial fibrillation.  RECOMMENDATIONS:    1. Continue current medical therapy. Anticoagulation per neurology recommendations. No clear indication for warfarin, although the globally hypokinetic ventricle may increase the risk of LV thrombus formation.  Time Spent Directly with the Patient:  45 minutes   Chrystie Nose, MD, [facility] Attending Cardiologist [facility]  01/04/2013, 9:55 AM

## 2013-01-04 NOTE — H&P (Signed)
    INTERVAL PROCEDURE H&P  History and Physical Interval Note:  01/04/2013 8:08 AM  Devon Ortiz has presented today for their planned procedure. The various methods of treatment have been discussed with the patient and family. After consideration of risks, benefits and other options for treatment, the patient has consented to the procedure.  The patients' outpatient history has been reviewed, patient examined, and no change in status from most recent office note within the past 30 days. I have reviewed the patients' chart and labs and will proceed as planned. Questions were answered to the patient's satisfaction.   Chrystie Nose, MD, [facility] Attending Cardiologist [facility]  HILTY,Kenneth C 01/04/2013, 8:08 AM

## 2013-01-05 MED ORDER — POTASSIUM CHLORIDE CRYS ER 20 MEQ PO TBCR
20.0000 meq | EXTENDED_RELEASE_TABLET | Freq: Every day | ORAL | Status: DC
  Administered 2013-01-05 – 2013-01-07 (×3): 20 meq via ORAL
  Filled 2013-01-05 (×3): qty 1

## 2013-01-05 MED ORDER — FUROSEMIDE 80 MG PO TABS
80.0000 mg | ORAL_TABLET | Freq: Every day | ORAL | Status: DC
  Administered 2013-01-05 – 2013-01-07 (×3): 80 mg via ORAL
  Filled 2013-01-05 (×4): qty 1

## 2013-01-05 NOTE — Progress Notes (Signed)
SLP Cancellation Note  Patient Details Name: Devon Ortiz MRN: [id number] DOB: 10-May-1965  Received order for SLE.  Cognitive Linguistic evaluation completed on 01/04/13.   Moreen Fowler MS, CCC-SLP [phone] [facility] 01/05/2013, 4:31 PM

## 2013-01-05 NOTE — Progress Notes (Signed)
Occupational Therapy Evaluation Patient Details Name: Devon Ortiz MRN: [id number] DOB: 1966-01-23 Today's Date: 01/05/2013 Time: 5284-1324 OT Time Calculation (min): 21 min  OT Assessment / Plan / Recommendation History of present illness 47 y.o. male with a history of systolic and diastolic heart failure, hypertension, obesity, obstructive sleep apnea and chronic renal insufficiency, admitted on 12/26/2012 for exacerbation of heart failure. Patient developed acute onset of right facial weakness and slurred speech at around 5 PM today. He did not inform nursing staff until about 10 PM tonight. There is no previous history of stroke nor TIA. Patient has been on aspirin daily for antiplatelet therapy. CT scan of his head showed an old small left basal ganglia lacunar type infarction. No acute changes were noted. NIH stroke score was 3.     Clinical Impression   Patient presents with no ADL deficits at this time. No further OT needs, will sign off.    OT Assessment  Patient does not need any further OT services    Follow Up Recommendations  No OT follow up    Barriers to Discharge      Equipment Recommendations  None recommended by OT    Recommendations for Other Services    Frequency       Precautions / Restrictions Precautions Precautions: None   Pertinent Vitals/Pain No c/o pain    ADL  Eating/Feeding: Independent Where Assessed - Eating/Feeding: Bed level Grooming: Independent Where Assessed - Grooming: Unsupported sitting Upper Body Bathing: Simulated;Independent Where Assessed - Upper Body Bathing: Unsupported sitting Lower Body Bathing: Simulated;Independent Where Assessed - Lower Body Bathing: Unsupported sit to stand Upper Body Dressing: Simulated;Independent Where Assessed - Upper Body Dressing: Unsupported sitting Lower Body Dressing: Simulated;Independent Where Assessed - Lower Body Dressing: Unsupported sit to stand Toilet Transfer:  Simulated;Independent Toilet Transfer Method: Sit to Barista: Regular height toilet Toileting - Clothing Manipulation and Hygiene: Independent Where Assessed - Toileting Clothing Manipulation and Hygiene: Sit to stand from 3-in-1 or toilet Transfers/Ambulation Related to ADLs: I with transfers, mobility within room, no assistive device    OT Diagnosis:    OT Problem List:   OT Treatment Interventions:     OT Goals(Current goals can be found in the care plan section) Acute Rehab OT Goals Patient Stated Goal: to go back to work  Visit Information  Last OT Received On: 01/05/13 Assistance Needed: +1 History of Present Illness: 47 y.o. male with a history of systolic and diastolic heart failure, hypertension, obesity, obstructive sleep apnea and chronic renal insufficiency, admitted on 12/26/2012 for exacerbation of heart failure. Patient developed acute onset of right facial weakness and slurred speech at around 5 PM today. He did not inform nursing staff until about 10 PM tonight. There is no previous history of stroke nor TIA. Patient has been on aspirin daily for antiplatelet therapy. CT scan of his head showed an old small left basal ganglia lacunar type infarction. No acute changes were noted. NIH stroke score was 3.         Prior Functioning     Home Living Family/patient expects to be discharged to:: Private residence Living Arrangements: Alone Type of Home: House Home Access: Stairs to enter Secretary/administrator of Steps: 12 Entrance Stairs-Rails: Right Home Layout: Two level Alternate Level Stairs-Number of Steps: flight Home Equipment: None  Lives With: Alone Prior Function Level of Independence: Independent Comments: works as Museum/gallery conservator: No difficulties (dysarthria) Dominant Hand: Right  Vision/Perception Vision - History Baseline Vision: Wears glasses all the time Patient Visual Report: No  change from baseline Vision - Assessment Vision Assessment: Vision tested Ocular Range of Motion: Within Functional Limits Alignment/Gaze Preference: Within Defined Limits Tracking/Visual Pursuits: Able to track stimulus in all quads without difficulty Saccades: Within functional limits Visual Fields: No apparent deficits Perception Perception: Within Functional Limits Praxis Praxis: Intact   Cognition  Cognition Arousal/Alertness: Awake/alert Behavior During Therapy: WFL for tasks assessed/performed Overall Cognitive Status: Within Functional Limits for tasks assessed    Extremity/Trunk Assessment Upper Extremity Assessment Upper Extremity Assessment: Overall WFL for tasks assessed Lower Extremity Assessment Lower Extremity Assessment: Defer to PT evaluation     Mobility       Exercise     Balance     End of Session OT - End of Session Activity Tolerance: Patient tolerated treatment well Patient left: in bed;with call bell/phone within reach  GO     Skylar Priest A 01/05/2013, 2:58 PM

## 2013-01-05 NOTE — Progress Notes (Addendum)
[facility]  DAILY PROGRESS NOTE   Subjective:  Speech improving a little -slightly slurred, no true aphasia. No trouble swallowing. No difficulty with walking, exertional dyspnea at baseline, no dyspnea at rest. He notes ankle swelling. TEE results reviewed - no "smoking gun" source of embolism.  Objective:  Temp:  [97.6 F (36.4 C)-98.6 F (37 C)] 97.6 F (36.4 C) (09/13 0400) Pulse Rate:  [71-81] 71 (09/13 0400) Resp:  [18-20] 18 (09/13 0400) BP: (111-119)/(65-79) 118/74 mmHg (09/13 0400) SpO2:  [96 %-99 %] 99 % (09/13 0400) Weight:  [253 lb 8 oz (114.987 kg)] 253 lb 8 oz (114.987 kg) (09/13 0400) Weight change: 1.6 oz (0.045 kg)  Intake/Output from previous day: 09/12 0701 - 09/13 0700 In: 360 [P.O.:360] Out: 150 [Urine:150]  Intake/Output from this shift:    Medications: Current Facility-Administered Medications  Medication Dose Route Frequency Provider Last Rate Last Dose  . 0.9 %  sodium chloride infusion   Intravenous Continuous Chrystie Nose, MD      . acetaminophen (TYLENOL) tablet 650 mg  650 mg Oral Q4H PRN Elease Etienne, MD      . albuterol (PROVENTIL) (5 MG/ML) 0.5% nebulizer solution 2.5 mg  2.5 mg Nebulization Q2H PRN Elease Etienne, MD   2.5 mg at 12/30/12 1610  . amLODipine (NORVASC) tablet 10 mg  10 mg Oral Daily Brittainy Simmons, PA-C   10 mg at 01/04/13 1046  . aspirin EC tablet 81 mg  81 mg Oral Daily Elease Etienne, MD   81 mg at 01/04/13 1048  . carvedilol (COREG) tablet 25 mg  25 mg Oral BID WC Brittainy Simmons, PA-C   25 mg at 01/04/13 1900  . clopidogrel (PLAVIX) tablet 75 mg  75 mg Oral Q breakfast Abelino Derrick, PA-C   75 mg at 01/05/13 0845  . enoxaparin (LOVENOX) injection 60 mg  60 mg Subcutaneous Q24H Elease Etienne, MD   60 mg at 01/04/13 2114  . guaiFENesin (MUCINEX) 12 hr tablet 1,200 mg  1,200 mg Oral BID PRN Marykay Lex, MD      . hydrALAZINE (APRESOLINE) tablet 37.5 mg  37.5 mg Oral BID  Marykay Lex, MD   37.5 mg at 01/05/13 0845  . isosorbide mononitrate (IMDUR) 24 hr tablet 30 mg  30 mg Oral Daily Elease Etienne, MD   30 mg at 01/04/13 1048  . labetalol (NORMODYNE,TRANDATE) injection 10 mg  10 mg Intravenous Q2H PRN Elease Etienne, MD   10 mg at 12/26/12 1905  . multivitamin with minerals tablet 1 tablet  1 tablet Oral Daily Elease Etienne, MD   1 tablet at 01/04/13 1048  . ondansetron (ZOFRAN) injection 4 mg  4 mg Intravenous Q6H PRN Elease Etienne, MD      . pantoprazole (PROTONIX) EC tablet 40 mg  40 mg Oral Daily Winfield Rast, RPH   40 mg at 01/04/13 1050  . sodium chloride 0.9 % injection 3 mL  3 mL Intravenous Q12H Elease Etienne, MD   3 mL at 01/04/13 2116  . sodium chloride 0.9 % injection 3 mL  3 mL Intravenous PRN Elease Etienne, MD      . spironolactone (ALDACTONE) tablet 12.5 mg  12.5 mg Oral Daily Lennette Bihari, MD   12.5 mg at 01/04/13 1050  . zolpidem (AMBIEN) tablet 10 mg  10 mg Oral QHS PRN Rolan Lipa, NP   10 mg at  01/05/13 0034    Physical Exam: PE: General:Pleasant affect, NAD  Skin:Warm and dry, brisk capillary refill  HEENT:normocephalic, sclera clear, mucus membranes moist  Heart:S1S2 RRR without murmur, gallup, rub or click  Lungs:clear without rales, rhonchi, or wheezes  WUJ:WJXBJ, soft, non tender, + BS, do not palpate liver spleen or masses  Ext:no lower ext edema though legs mildly tight on exam, but much improved  Neuro:alert and oriented, MAE, follows commands, mild rt lip droop with smile, + facial symmetry with frown. Equal upper ext. Grips, push, and equal strength of lower ext. Mild slurred speech.   Lab Results: Results for orders placed during the hospital encounter of 12/26/12 (from the past 48 hour(s))  BASIC METABOLIC PANEL     Status: Abnormal   Collection Time    01/04/13  5:00 AM      Result Value Range   Sodium 136  135 - 145 mEq/L   Potassium 3.9  3.5 - 5.1 mEq/L   Chloride 98  96 - 112  mEq/L   CO2 26  19 - 32 mEq/L   Glucose, Bld 127 (*) 70 - 99 mg/dL   BUN 25 (*) 6 - 23 mg/dL   Creatinine, Ser 4.78 (*) 0.50 - 1.35 mg/dL   Calcium 9.2  8.4 - 29.5 mg/dL   GFR calc non Af Amer 35 (*) >90 mL/min   GFR calc Af Amer 41 (*) >90 mL/min   Comment: (NOTE)     The eGFR has been calculated using the CKD EPI equation.     This calculation has not been validated in all clinical situations.     eGFR's persistently <90 mL/min signify possible Chronic Kidney     Disease.  PRO B NATRIURETIC PEPTIDE     Status: Abnormal   Collection Time    01/04/13  5:00 AM      Result Value Range   Pro B Natriuretic peptide (BNP) 884.6 (*) 0 - 125 pg/mL    Imaging: No results found.  Assessment:  1. Principal Problem: 2.   Acute on chronic combined systolic and diastolic congestive heart failure 3. Active Problems: 4.   Acute on chronic renal insufficiency 5.   S/P cardiac catheterization, 01/21/11 with normal coronary arteries 6.   Hypokalemia 7.   H/O noncompliance with medical treatment, presenting hazards to health 8.   Non-ischemic cardiomyopathy - EF 30-35% 2D April 2014 9.   OSA (obstructive sleep apnea) - supposed to be on CPAP 10.   Accelerated essential hypertension 11.   Anemia 12.   CKD (chronic kidney disease), stage III 13.   Cardiorenal syndrome with renal failure 14.   Diastolic dysfunction- grade 2 by echo April 2014 15.   CVA (cerebral infarction)- symptoms 01/01/13- scattered sm. embolic infarcts 16.   Plan:  1. Per Neurology recommendations, secondary CVA prevention with clopidogrel. Although concern remains for LV thrombus with his cardiomyopathy, the risk/benefit ratio of full anticoagulation is uncertain. 2. Resume furosemide for worsening edema. The incredible broad swing in weight (lost 26 lb in 14 hours on Sept 10) makes it impossible to assess where he stands relative to his "dry weight". His serum creatinine is at baseline. 3. Reevaluate indication for AICD  when he recovers from stroke - discuss in office. Non compliance with life vest noted.  Time Spent Directly with Patient:  30 minutes  Length of Stay:  LOS: 10 days    Devon Ortiz 01/05/2013, 9:15 AM

## 2013-01-06 NOTE — Progress Notes (Signed)
Pt. Seen and examined. Agree with the NP/PA-C note as written.  No complaints. Lying flat without breathing difficulty. LifeVest is in the room. He is on telemetry. Recommend one more day of diuresis. Medications doses have been pushed up. He is on plavix and ASA. Anticipate probable d/c home tomorrow. Will need home speech therapy.  Chrystie Nose, MD, [facility] Attending Cardiologist [facility]

## 2013-01-06 NOTE — Progress Notes (Signed)
PT has a home CPAP unit and is aware to call RT if he needs help with the machine. RT will continue to assist as needed.

## 2013-01-06 NOTE — Progress Notes (Signed)
Subjective: Slurred speech but not swallowing or ambulation difficulties.  Breath well.  Objective: Vital signs in last 24 hours: Temp:  [98 F (36.7 C)-98.7 F (37.1 C)] 98.7 F (37.1 C) (09/14 0500) Pulse Rate:  [72-79] 72 (09/14 0839) Resp:  [18-20] 18 (09/14 0500) BP: (105-130)/(63-81) 130/80 mmHg (09/14 0839) SpO2:  [99 %-100 %] 100 % (09/14 0500) Last BM Date: 01/05/13  Intake/Output from previous day: 09/13 0701 - 09/14 0700 In: 840 [P.O.:840] Out: 1001 [Urine:1000; Stool:1] Intake/Output this shift:    Medications Current Facility-Administered Medications  Medication Dose Route Frequency Provider Last Rate Last Dose  . 0.9 %  sodium chloride infusion   Intravenous Continuous Chrystie Nose, MD      . acetaminophen (TYLENOL) tablet 650 mg  650 mg Oral Q4H PRN Elease Etienne, MD      . albuterol (PROVENTIL) (5 MG/ML) 0.5% nebulizer solution 2.5 mg  2.5 mg Nebulization Q2H PRN Elease Etienne, MD   2.5 mg at 12/30/12 5409  . amLODipine (NORVASC) tablet 10 mg  10 mg Oral Daily Brittainy Simmons, PA-C   10 mg at 01/05/13 1051  . aspirin EC tablet 81 mg  81 mg Oral Daily Elease Etienne, MD   81 mg at 01/05/13 1051  . carvedilol (COREG) tablet 25 mg  25 mg Oral BID WC Brittainy Simmons, PA-C   25 mg at 01/06/13 0839  . clopidogrel (PLAVIX) tablet 75 mg  75 mg Oral Q breakfast Abelino Derrick, PA-C   75 mg at 01/06/13 0840  . enoxaparin (LOVENOX) injection 60 mg  60 mg Subcutaneous Q24H Elease Etienne, MD   60 mg at 01/05/13 2141  . furosemide (LASIX) tablet 80 mg  80 mg Oral Daily Mihai Croitoru, MD   80 mg at 01/05/13 1707  . guaiFENesin (MUCINEX) 12 hr tablet 1,200 mg  1,200 mg Oral BID PRN Marykay Lex, MD      . hydrALAZINE (APRESOLINE) tablet 37.5 mg  37.5 mg Oral BID Marykay Lex, MD   37.5 mg at 01/06/13 0839  . isosorbide mononitrate (IMDUR) 24 hr tablet 30 mg  30 mg Oral Daily Elease Etienne, MD   30 mg at 01/05/13 1051  . labetalol  (NORMODYNE,TRANDATE) injection 10 mg  10 mg Intravenous Q2H PRN Elease Etienne, MD   10 mg at 12/26/12 1905  . multivitamin with minerals tablet 1 tablet  1 tablet Oral Daily Elease Etienne, MD   1 tablet at 01/05/13 1051  . ondansetron (ZOFRAN) injection 4 mg  4 mg Intravenous Q6H PRN Elease Etienne, MD      . pantoprazole (PROTONIX) EC tablet 40 mg  40 mg Oral Daily Winfield Rast, RPH   40 mg at 01/05/13 1052  . potassium chloride SA (K-DUR,KLOR-CON) CR tablet 20 mEq  20 mEq Oral Daily Mihai Croitoru, MD   20 mEq at 01/05/13 1659  . sodium chloride 0.9 % injection 3 mL  3 mL Intravenous Q12H Elease Etienne, MD   3 mL at 01/05/13 2138  . sodium chloride 0.9 % injection 3 mL  3 mL Intravenous PRN Elease Etienne, MD      . spironolactone (ALDACTONE) tablet 12.5 mg  12.5 mg Oral Daily Lennette Bihari, MD   12.5 mg at 01/05/13 1053  . zolpidem (AMBIEN) tablet 10 mg  10 mg Oral QHS PRN Rolan Lipa, NP   10 mg at 01/06/13 0030  PE: General appearance: alert, cooperative and no distress Neck: no JVD Lungs: clear to auscultation bilaterally Heart: regular rate and rhythm, S1, S2 normal, no murmur, click, rub or gallop Abdomen: soft, no distension, non-tender Extremities: Minimal LEE. Pulses: 2+ and symmetric Skin: Warm and dry Neurologic: Right facial droop slurred speech.  Lab Results:  No results found for this basename: WBC, HGB, HCT, PLT,  in the last 72 hours BMET  Recent Labs  01/04/13 0500  NA 136  K 3.9  CL 98  CO2 26  GLUCOSE 127*  BUN 25*  CREATININE 2.16*  CALCIUM 9.2    Assessment/Plan  Principal Problem:   Acute on chronic combined systolic and diastolic congestive heart failure Active Problems:   Acute on chronic renal insufficiency   S/P cardiac catheterization, 01/21/11 with normal coronary arteries   Hypokalemia   H/O noncompliance with medical treatment, presenting hazards to health   Non-ischemic cardiomyopathy - EF 30-35% 2D April  2014   OSA (obstructive sleep apnea) - supposed to be on CPAP   Accelerated essential hypertension   Anemia   CKD (chronic kidney disease), stage III   Cardiorenal syndrome with renal failure   Diastolic dysfunction- grade 2 by echo April 2014   CVA (cerebral infarction)- symptoms 01/01/13- scattered sm. embolic infarcts  Plan:  Net fluids:  -0.2L/-19.8L.  Lasix at 80mg  daily.  BP and HR controlled. He appears euvolemic or close to it.  Wt decreased from 291 to 253 and is stable now.  We discussed daily wts and sodium restriction.  ASA, plavix, amlodipine, coreg 25 bid, hydralazine 37.5 BID,  Imdur, Spironolactone 12.5mg , K+.     LOS: 11 days    Elania Crowl 01/06/2013 9:25 AM

## 2013-01-07 ENCOUNTER — Encounter (HOSPITAL_COMMUNITY): Payer: Self-pay | Admitting: Internal Medicine

## 2013-01-07 MED ORDER — HYDRALAZINE HCL 25 MG PO TABS: 37.5000 mg | ORAL_TABLET | Freq: Two times a day (BID) | ORAL | Status: DC

## 2013-01-07 MED ORDER — FUROSEMIDE 80 MG PO TABS: 80.0000 mg | ORAL_TABLET | Freq: Every day | ORAL | Status: DC

## 2013-01-07 MED ORDER — CARVEDILOL 25 MG PO TABS: 25.0000 mg | ORAL_TABLET | Freq: Two times a day (BID) | ORAL | Status: DC

## 2013-01-07 MED ORDER — PANTOPRAZOLE SODIUM 40 MG PO TBEC: 40.0000 mg | DELAYED_RELEASE_TABLET | Freq: Every day | ORAL | Status: DC

## 2013-01-07 MED ORDER — CLOPIDOGREL BISULFATE 75 MG PO TABS: 75.0000 mg | ORAL_TABLET | Freq: Every day | ORAL | Status: DC

## 2013-01-07 MED ORDER — SPIRONOLACTONE 25 MG PO TABS: 12.5000 mg | ORAL_TABLET | Freq: Every day | ORAL | Status: DC

## 2013-01-07 NOTE — Discharge Summary (Signed)
I saw & examined the patient today prior to d/c.  He was dramatically improved from a CHF standpoint & seems to have made a notable recover from his CVA.  See my last PN for details.  Marykay Lex, MD

## 2013-01-07 NOTE — Discharge Summary (Signed)
Physician Discharge Summary  Patient ID: Devon Ortiz MRN: [id number] DOB/AGE: 46/46/1967 46 y.o.  Admit date: 12/26/2012 Discharge date: 01/07/2013  Admission Diagnoses: Acute on Chronic Combined Systolic and Diastolic CHF  Discharge Diagnoses:  Principal Problem:   Acute on chronic combined systolic and diastolic congestive heart failure Active Problems:   Acute on chronic renal insufficiency   S/P cardiac catheterization, 01/21/11 with normal coronary arteries   Hypokalemia   H/O noncompliance with medical treatment, presenting hazards to health   Non-ischemic cardiomyopathy - EF 30-35% 2D April 2014   OSA (obstructive sleep apnea) - supposed to be on CPAP   Accelerated essential hypertension   Anemia   CKD (chronic kidney disease), stage III   Cardiorenal syndrome with renal failure   Diastolic dysfunction- grade 2 by echo April 2014   CVA (cerebral infarction)- symptoms 01/01/13- scattered sm. embolic infarcts   Discharged Condition: stable  Hospital Course: Devon Ortiz is a 47 y.o. male, with a history of obesity, hypertension, Stage IV chronic kidney disease, with a baseline creatinine around 1.75, LVH, and combined systolic and diastolic heart failure. He has a nonischemic cardiomyopathy with an EF of 30-35%. He was prescribed a LifeVest for prevention of SCD, but has failed to be compliant.   He initially presented to the [facility] ER on 12/26/12 with complaints of increasing SOB, orthopnea, LEE and weight gain. Initial work-up was suggestive of an acute on chronic exacerbation of his heart failure. BNP was elevated at 3,437. His CXR showed cardiomegaly with pulmonary edema, a small right-sided effusion  and bibasilar atelectasis. He was admitted and treated with IV diuretics. He had good diuresis and his symptoms and edema improved. A repeat 2 D echo was obtained and demonstrated no change in systolic function, compared to prior study in April 2014. His EF remained severely reduced at  30-35%. He was treated with the appropriate HF meds, excluding an ACE-I/ARB due to renal function. He was treated with a BB, Spironolactone and hydralazine/nitrate. During his hospitalization, he was noted to have right-sided facial droop and slurred speech. Neurology was consulted. A CT of the head demonstrated an indeterminate lacunar infarct in the left thalamus. There was no acute intercranial hemorrhage.  A follow-up MRI demonstrated scattered acute small non hemorrhagic infarcts in the frontal lobes, the right anterior sub insular region and the left parietal lobe with the largest infarct in the mid left corona radiata measuring up to 1 cm. There was a remote small left thalamic infarct. There was question of embolic disease. Subsequently, he underwent a TEE to rule out a thrombus. The procedure was performed by Dr. Rennis Ortiz. No cardiac source of embolus was identified.  Neurology's recommendation was for 75 mg of Plavix daily for secondary stroke prevention. The patient had residual dysarthria and underwent evaluation by speech therapy. By hospital day 12, he had reached euvolemic state. He diuresed a total of 20.4L and was down to a dry weight of 251 lbs. He was seen and examined by Dr. Herbie Ortiz, who determined that he was stable for discharge. He was discharged on 80 mg of PO Lasix daily, with dosing instructions to increase dose to BID if weight is > 3lb over his dry weight. He was also discharged on 25 mg of Coreg BID, 12.5 mg of Spironolactone, 37.5 mg of hydralazine BID and 30 mg of Imdur. He was also prescribed daily potassium supplements and Protonix for GI prophylaxis. He was ordered to resume speech therapy as an outpatient. He will follow-up with Devon Macho  Kilroy, PA-C on 9/19, then with Dr. Royann Ortiz on 10/2 for consideration for an ICD. He has been instructed to continue wearing his LifeVest until seen by Dr. Royann Ortiz. He will also follow-up with Dr. Pearlean Ortiz, of Neurology, in 2 months. He was ordered to  continue Plavix daily and to discontinue ASA.   Consults: neurology and speech therapy  Significant Diagnostic Studies:   2D echo 01/02/13 Study Conclusions  - Left ventricle: The cavity size was moderately dilated. Systolic function was moderately to severely reduced. The estimated ejection fraction was in the range of 30% to 35%. Doppler parameters are consistent with a reversible restrictive pattern, indicative of decreased left ventricular diastolic compliance and/or increased left atrial pressure (grade 3 diastolic dysfunction). Doppler parameters are consistent with elevated mean left atrial filling pressure. - Ventricular septum: Septal motion showed abnormal function, dyssynergy, and paradox. - Left atrium: The atrium was mildly to moderately dilated. - Right ventricle: The cavity size was mildly dilated. Systolic function was mildly reduced. - Right atrium: The atrium was mildly dilated. - Atrial septum: No defect or patent foramen ovale was identified. - Tricuspid valve: Moderate regurgitation.   CT of the head 01/01/13 IMPRESSION:  Small age indeterminate lacunar infarct in the left thalamus. No  acute intracranial hemorrhage.    MRI of head 01/02/13 IMPRESSION:  Scattered acute small non hemorrhagic infarcts frontal lobes, right  anterior sub insular region and left parietal lobe with largest  infarct mid left corona radiata measuring up to 1 cm. Question  embolic disease?  Moderate white matter type changes which in the present clinical  setting is most consistent with result of small vessel disease type  changes.  Remote small left thalamic infarct.    TEE 01/04/13 IMPRESSION:  1. No cardiac source of embolus identified. 2. Severe global hypokinesis, dilated ventricle, EF 30%. 3. Normal LAA without thrombus and high velocities, arguing against recent or paroxysmal atrial fibrillation.   Treatments: See Hospital Course  Discharge Exam: Blood pressure  131/83, pulse 81, temperature 97.5 F (36.4 C), temperature source Oral, resp. rate 18, height 5\' 8"  (1.727 m), weight 251 lb 14.4 oz (114.261 kg), SpO2 100.00%.   Disposition: 01-Home or Self Care  Discharge Orders   Future Appointments Provider Department Dept Phone   01/09/2013 8:00 AM Abelino Derrick, [place] [facility] [place] [phone]   01/11/2013 2:00 PM Abelino Derrick, PA-C [facility] [place] [phone]   01/24/2013 3:00 PM Thurmon Fair, MD [facility]  [phone]   Future Orders Complete By Expires   Ambulatory referral to Speech Therapy  As directed    Diet - low sodium heart healthy  As directed    Discharge instructions  As directed    Comments:     Continue to wear LifeVest until seen in clinic by Dr. Royann Ortiz  Sliding scale Lasix: Weigh yourself when you get home, then Daily in the Morning. Your dry weight will be what your scale says on the day you return home.  If you gain more than 3 pounds from dry weight: Increase the Lasix dosing to 80 mg in the morning and 80 mg in the afternoon until weight returns to baseline dry weight. If weight gain is greater than 5 pounds in 2 days: Increased to Lasix 80 mg twice a day and contact the office for further assistance if weight does not go down the next day. If the weight goes down more than 3 pounds from  dry weight: Hold Lasix until it returns to baseline dry weight   Increase activity slowly  As directed        Medication List    STOP taking these medications       aspirin EC 81 MG tablet     metoprolol 100 MG tablet  Commonly known as:  LOPRESSOR      TAKE these medications       amLODipine 10 MG tablet  Commonly known as:  NORVASC  Take 10 mg by mouth daily.     carvedilol 25 MG tablet  Commonly known as:  COREG  Take 1 tablet (25 mg total) by mouth 2 (two) times daily with a meal.     clopidogrel 75 MG tablet   Commonly known as:  PLAVIX  Take 1 tablet (75 mg total) by mouth daily with breakfast.     furosemide 80 MG tablet  Commonly known as:  LASIX  Take 1 tablet (80 mg total) by mouth daily. Take 1 tablet daily. Increase dose to 2 tablets if weight is greater than 3 lbs over dry weight.     furosemide 80 MG tablet  Commonly known as:  LASIX  Take 80 mg by mouth daily.     hydrALAZINE 25 MG tablet  Commonly known as:  APRESOLINE  Take 1.5 tablets (37.5 mg total) by mouth 2 (two) times daily.     isosorbide mononitrate 30 MG 24 hr tablet  Commonly known as:  IMDUR  Take 30 mg by mouth daily.     multivitamin with minerals Tabs tablet  Take 1 tablet by mouth daily.     pantoprazole 40 MG tablet  Commonly known as:  PROTONIX  Take 1 tablet (40 mg total) by mouth daily.     potassium chloride SA 20 MEQ tablet  Commonly known as:  K-DUR,KLOR-CON  Take 20 mEq by mouth daily.     spironolactone 25 MG tablet  Commonly known as:  ALDACTONE  Take 0.5 tablets (12.5 mg total) by mouth daily.           Follow-up Information   Follow up with Gates Rigg, MD In 2 months. (in stroke clinic)    Specialties:  Neurology, Radiology   Contact information:   [address] [place] [address] [phone]       Follow up with Abelino Derrick, PA-C On 01/11/2013. (2:00 pm)    Specialty:  Cardiology   Contact information:   [address] [place] [address] [phone]       Follow up with Thurmon Fair, MD On 01/24/2013. (3:00 pm for consideration of ICD )    Specialty:  Cardiology   Contact information:   [address] [place] [address] [phone]      TIME SPENT ON DISCHARGE INCLUDING PHYSICIAN TIME: > 30 MINUTES   Signed: Allayne Butcher, PA-C 01/07/2013, 11:02 AM

## 2013-01-07 NOTE — Progress Notes (Signed)
Subjective:  Breathing well   Objective:  Vital Signs in the last 24 hours: Temp:  [97.5 F (36.4 C)-98.7 F (37.1 C)] 97.5 F (36.4 C) (09/15 0824) Pulse Rate:  [70-81] 81 (09/15 0824) Resp:  [16-18] 18 (09/15 0824) BP: (118-131)/(70-86) 131/83 mmHg (09/15 0824) SpO2:  [99 %-100 %] 100 % (09/15 0824) Weight:  [251 lb 14.4 oz (114.261 kg)] 251 lb 14.4 oz (114.261 kg) (09/15 0500)  Intake/Output from previous day: 09/14 0701 - 09/15 0700 In: 240 [P.O.:240] Out: 825 [Urine:825] Intake/Output from this shift:   Net out 20.4L over the admission -- truly, the initial wgt of 294 lb & now 251lb is a reasonable estimation of expected wgt loss.  Will set ~250-252 as his baseline Dry wgt (will need to weigh himself @ home)  Physical Exam: General appearance: alert, cooperative, appears stated age, no distress and morbidly obese Neck: no adenopathy, no carotid bruit and no JVD Lungs: clear to auscultation bilaterally, normal percussion bilaterally and non-labored Heart: regular rate and rhythm, S1, S2 normal, no murmur, click, rub or gallop and normal apical impulse Abdomen: soft, non-tender; bowel sounds normal; no masses,  no organomegaly and abdominal striae noted. -- significant loss of distension Extremities: extremities normal, atraumatic, no cyanosis or edema Pulses: 2+ and symmetric Neurologic: Cranial nerves: VII: lower facial muscle function reduced - mild residual facial droop on the right with mild slurring of speech - notably improved.  Lab Results: reviewed  Imaging: Imaging results have been reviewed  Cardiac Studies:  Assessment/Plan:  Principal Problem:   Acute on chronic combined systolic and diastolic congestive heart failure Active Problems:   H/O noncompliance with medical treatment, presenting hazards to health   Non-ischemic cardiomyopathy - EF 30-35% 2D April 2014   Accelerated essential hypertension   Hypokalemia   OSA (obstructive sleep apnea) - supposed  to be on CPAP   Cardiorenal syndrome with renal failure   Acute on chronic renal insufficiency   S/P cardiac catheterization, 01/21/11 with normal coronary arteries   Anemia   CKD (chronic kidney disease), stage III   Diastolic dysfunction- grade 2 by echo April 2014   CVA (cerebral infarction)- symptoms 01/01/13- scattered sm. embolic infarcts   Has diuresed ~20.4 L this admission -- finally the weights seem to make sense.  ==> a dry wgt of ~251lb (this makes sense with the amount of diuresis,. Will need to see how this correlates with his home scale) -->   Currently on 80 mg po daily of Furosemide; discussed daily wgt measurement & SS Lasix (with additional dosing if wgt up > 3 lb from dry wgt.   Essentially back to baseline renal fxn - but with tendency to waver, will keep Spironolactone @ 12.5 mg daily  On target dose of Carvedilol (could potentially increase to as high as 50mg  bid, but BP & HR stable with current dose)  On stable dose of Hydralazine 37.5 mg bid + Nitrate (Imdur) -- used instead of ACE-I / or ARB due to CKDIII  Will need to have TCM f/u with PA (Mr. Diona Fanti) --> will also need ~1 month visit with Dr. Royann Shivers to discuss +/- ICD,  Until then, will need to continue LifeVest; he ensures me that he will wear it. --> will need continued counseling  HTN - was uncontrolled on admission - BB changed to Carvedilol & Hydralazine dosage / timing was adjusted --> has been very well controlled for ~ 1 week on Current dose of Carvedilol, Amlodipine, Hydralazine/Imdur, Spironolactone.  CVA - unsure of etiology, but cardio-embolic phenomenon is most likely with reduced EF LV thrombus.  I discussed the pros-cons of Plavix vs. Warfarin with Dr. Pearlean Brownie from the CVA Service (who has already signed off) -- He assures me that the Data does not support the use of warfarin over ASA or Plavix. His Sx seem to have improved since Friday.  Will d/c on Plavix alone (without ASA)  Will need OP Speech  therapy   Will f/u with Dr. Pearlean Brownie as scheduled.  Will need GI prophylaxis -- Pantoprazole while on Plavix.   Plan - d/c to day TCM f/u next week with Mr. Diona Fanti, [place]; then ~1 month with Dr. Royann Shivers to discuss ICD (will wear LifeVest until then); f/u with me after.    LOS: 12 days    HARDING,DAVID W 01/07/2013, 9:02 AM

## 2013-01-09 ENCOUNTER — Telehealth: Payer: Self-pay | Admitting: Cardiology

## 2013-01-09 ENCOUNTER — Ambulatory Visit: Payer: Self-pay | Admitting: Cardiology

## 2013-01-09 NOTE — Telephone Encounter (Signed)
TCM # 2- No answer, left message.  Corine Shelter PA-C 01/09/2013 10:48 AM

## 2013-01-09 NOTE — Telephone Encounter (Signed)
TCM phone call  10:00 am no answer, left message. Corine Shelter PA-C 01/09/2013 10:09 AM

## 2013-01-11 ENCOUNTER — Encounter: Payer: Self-pay | Admitting: Cardiology

## 2013-01-11 ENCOUNTER — Ambulatory Visit (INDEPENDENT_AMBULATORY_CARE_PROVIDER_SITE_OTHER): Payer: MEDICAID | Admitting: Cardiology

## 2013-01-11 VITALS — BP 134/90 | HR 83 | Ht 68.0 in | Wt 251.1 lb

## 2013-01-11 DIAGNOSIS — N183 Chronic kidney disease, stage 3 unspecified: Secondary | ICD-10-CM

## 2013-01-11 DIAGNOSIS — I428 Other cardiomyopathies: Secondary | ICD-10-CM

## 2013-01-11 DIAGNOSIS — I5043 Acute on chronic combined systolic (congestive) and diastolic (congestive) heart failure: Secondary | ICD-10-CM

## 2013-01-11 DIAGNOSIS — Z9889 Other specified postprocedural states: Secondary | ICD-10-CM

## 2013-01-11 DIAGNOSIS — G4733 Obstructive sleep apnea (adult) (pediatric): Secondary | ICD-10-CM

## 2013-01-11 DIAGNOSIS — I509 Heart failure, unspecified: Secondary | ICD-10-CM

## 2013-01-11 DIAGNOSIS — I639 Cerebral infarction, unspecified: Secondary | ICD-10-CM

## 2013-01-11 DIAGNOSIS — I5042 Chronic combined systolic (congestive) and diastolic (congestive) heart failure: Secondary | ICD-10-CM

## 2013-01-11 DIAGNOSIS — I6350 Cerebral infarction due to unspecified occlusion or stenosis of unspecified cerebral artery: Secondary | ICD-10-CM

## 2013-01-11 NOTE — Assessment & Plan Note (Signed)
He still has some residual speech deficet

## 2013-01-11 NOTE — Progress Notes (Signed)
01/11/2013 Devon Ortiz   18-Nov-1965  [id number]  Primary Physicia Dorrene German, MD Primary Cardiologist: Dr Herbie Baltimore  HPI:  47 y/o with a history of NICM and non compliance secondary to no insurance. He had a cath in 2012 showing normal coronaries. He had an improvement in his EF initially but later this deteriorated secondary to medical non compliance. He was admitted in April 2014 when he went off his medications and stayed several days being diuresed. He was admitted again Sept 3d with acute on chronic CHF. He says he had been compliant with his medication but he had been sick and was unable to keep his medications down. He was diuresed and his medications were adjusted. He was nearing discharge when he developed speech difficulty. He was seen by the neurology service. His MRI suggested embolic small infarcts. TEE done 01/04/13 showed no LVT.The Neurologist recommended Plavix.          I called the pt twice 48 hrs ago for TCM follow up but had to leave a message. He says he is living with his mother. He did come in for TCM office visit today but he is not wearing his Life Vest. He says it is at home and the battery is charging. He denies any increased SOB or edema. He says he has been compliant with his medications.   Current Outpatient Prescriptions  Medication Sig Dispense Refill  . amLODipine (NORVASC) 10 MG tablet Take 10 mg by mouth daily.      . carvedilol (COREG) 25 MG tablet Take 1 tablet (25 mg total) by mouth 2 (two) times daily with a meal.  60 tablet  5  . clopidogrel (PLAVIX) 75 MG tablet Take 1 tablet (75 mg total) by mouth daily with breakfast.  30 tablet  11  . furosemide (LASIX) 80 MG tablet Take 80 mg by mouth daily.      . furosemide (LASIX) 80 MG tablet Take 1 tablet (80 mg total) by mouth daily. Take 1 tablet daily. Increase dose to 2 tablets if weight is greater than 3 lbs over dry weight.  45 tablet  5  . hydrALAZINE (APRESOLINE) 25 MG tablet Take 1.5 tablets (37.5 mg  total) by mouth 2 (two) times daily.  90 tablet  5  . isosorbide mononitrate (IMDUR) 30 MG 24 hr tablet Take 30 mg by mouth daily.      . Multiple Vitamin (MULTIVITAMIN WITH MINERALS) TABS Take 1 tablet by mouth daily.      . pantoprazole (PROTONIX) 40 MG tablet Take 1 tablet (40 mg total) by mouth daily.  30 tablet  5  . potassium chloride SA (K-DUR,KLOR-CON) 20 MEQ tablet Take 20 mEq by mouth daily.      Marland Kitchen spironolactone (ALDACTONE) 25 MG tablet Take 0.5 tablets (12.5 mg total) by mouth daily.  30 tablet  1   No current facility-administered medications for this visit.    Allergies  Allergen Reactions  . Tomato Rash    History   Social History  . Marital Status: Married    Spouse Name: N/A    Number of Children: N/A  . Years of Education: N/A   Occupational History  . Not on file.   Social History Main Topics  . Smoking status: Never Smoker   . Smokeless tobacco: Never Used  . Alcohol Use: No  . Drug Use: No  . Sexual Activity: No   Other Topics Concern  . Not on file   Social History Narrative  .  No narrative on file     Review of Systems: General: negative for chills, fever, night sweats or weight changes.  Cardiovascular: negative for chest pain, dyspnea on exertion, edema, orthopnea, palpitations, paroxysmal nocturnal dyspnea or shortness of breath Dermatological: negative for rash Respiratory: negative for cough or wheezing Urologic: negative for hematuria Abdominal: negative for nausea, vomiting, diarrhea, bright red blood per rectum, melena, or hematemesis Neurologic: negative for visual changes, syncope, or dizziness All other systems reviewed and are otherwise negative except as noted above.    Blood pressure 134/90, pulse 83, height 5\' 8"  (1.727 m), weight 251 lb 1.6 oz (113.898 kg).  General appearance: alert, cooperative, no distress and moderately obese Lungs: clear to auscultation bilaterally Heart: regular rate and rhythm Extremities: Trace  edema bilat  EKG NSR without acute changes  ASSESSMENT AND PLAN:   Chronic combined systolic and diastolic HF (heart failure), NYHA class 2 Hospitalized in April 2014 (non compliance) and again in Sept 2014  CKD (chronic kidney disease), stage III SCr  2.16  Sept12  CVA (cerebral infarction)- symptoms 01/01/13- scattered sm. embolic infarcts He still has some residual speech deficet  Non-ischemic cardiomyopathy - EF 25-30% Sept 2014 by TEE, 30-35% by TTE Wearing a Life Vest but not always compliant with this  OSA (obstructive sleep apnea) - supposed to be on CPAP .  S/P cardiac catheterization, 01/21/11 with normal coronary arteries .   PLAN  His weight today is 251- same as his discharge weight from 01/07/13. I explained the importance of the Life Vest and that it may save his life but only if he wears it. He assures me he is OK with his medications. He will return in a month to see Dr Herbie Baltimore. At that time we can schedule him for a follow up TTE. I'll order a BMP today as he is on K+ and Aldactone.  Rozann Holts KPA-C 01/11/2013 2:24 PM

## 2013-01-11 NOTE — Assessment & Plan Note (Signed)
Hospitalized in April 2014 (non compliance) and again in Sept 2014

## 2013-01-11 NOTE — Assessment & Plan Note (Signed)
SCr  2.16  Sept12

## 2013-01-11 NOTE — Assessment & Plan Note (Addendum)
Wearing a Life Vest but not always compliant with this

## 2013-01-11 NOTE — Patient Instructions (Signed)
Your physician recommends that you schedule a follow-up appointment in: one month with Dr Herbie Baltimore Your physician recommends that you return for lab work today

## 2013-01-11 NOTE — Assessment & Plan Note (Deleted)
Hospitalized at [facility] in April 2014 secondary to non compliance and again Sept 2014

## 2013-01-12 LAB — BASIC METABOLIC PANEL
BUN: 17 mg/dL (ref 6–23)
CO2: 24 mEq/L (ref 19–32)
Calcium: 9.2 mg/dL (ref 8.4–10.5)
Chloride: 106 mEq/L (ref 96–112)
Creat: 1.87 mg/dL — ABNORMAL HIGH (ref 0.50–1.35)
Glucose, Bld: 140 mg/dL — ABNORMAL HIGH (ref 70–99)
Potassium: 3.7 mEq/L (ref 3.5–5.3)
Sodium: 141 mEq/L (ref 135–145)

## 2013-01-17 ENCOUNTER — Telehealth: Payer: Self-pay | Admitting: *Deleted

## 2013-01-17 NOTE — Telephone Encounter (Signed)
Spoke to patient. Result given . Verbalized understanding.  Keep appointment 01/24/13 with Dr Herbie Baltimore.

## 2013-01-17 NOTE — Telephone Encounter (Signed)
Message copied by Tobin Chad on Thu Jan 17, 2013  9:56 AM ------      Message from: Abelino Derrick      Created: Mon Jan 14, 2013 10:48 AM       Labs look good, same Rx. Keep follow up with Dr Arnette Felts PA-C      01/14/2013      10:48 AM             ------

## 2013-01-21 NOTE — Progress Notes (Signed)
[facility] pharmacy did not receive the electronic Rx for Hydralazine 25mg  1 1/2 bid #90 x 5 done from the hospital. Rx called in.

## 2013-01-24 ENCOUNTER — Ambulatory Visit (INDEPENDENT_AMBULATORY_CARE_PROVIDER_SITE_OTHER): Payer: MEDICAID | Admitting: Cardiovascular Disease

## 2013-01-24 ENCOUNTER — Telehealth: Payer: Self-pay | Admitting: Pulmonary Disease

## 2013-01-24 ENCOUNTER — Encounter: Payer: Self-pay | Admitting: Cardiovascular Disease

## 2013-01-24 VITALS — BP 142/72 | HR 88 | Ht 67.0 in | Wt 251.0 lb

## 2013-01-24 DIAGNOSIS — I639 Cerebral infarction, unspecified: Secondary | ICD-10-CM

## 2013-01-24 DIAGNOSIS — I5042 Chronic combined systolic (congestive) and diastolic (congestive) heart failure: Secondary | ICD-10-CM

## 2013-01-24 DIAGNOSIS — I428 Other cardiomyopathies: Secondary | ICD-10-CM

## 2013-01-24 DIAGNOSIS — I6350 Cerebral infarction due to unspecified occlusion or stenosis of unspecified cerebral artery: Secondary | ICD-10-CM

## 2013-01-24 MED ORDER — PANTOPRAZOLE SODIUM 40 MG PO TBEC: 40.0000 mg | DELAYED_RELEASE_TABLET | Freq: Every day | ORAL | Status: DC

## 2013-01-24 MED ORDER — POTASSIUM CHLORIDE CRYS ER 20 MEQ PO TBCR: 20.0000 meq | EXTENDED_RELEASE_TABLET | Freq: Every day | ORAL | Status: DC

## 2013-01-24 MED ORDER — CARVEDILOL 25 MG PO TABS: 25.0000 mg | ORAL_TABLET | Freq: Two times a day (BID) | ORAL | Status: DC

## 2013-01-24 MED ORDER — CLOPIDOGREL BISULFATE 75 MG PO TABS: 75.0000 mg | ORAL_TABLET | Freq: Every day | ORAL | Status: DC

## 2013-01-24 NOTE — Telephone Encounter (Signed)
lmtcb x1 for pt to scheduled HFU with TP.

## 2013-01-24 NOTE — Telephone Encounter (Signed)
Called and spoke with pt and he stated that he has another follow up appt with [facility] today at 2:45.   Pt stated that this was not put in his dc papers to follow up with RA but they did tell this pt this.  Pt stated that he has been in the hospital a couple of times this year.  He stated that [facility] will not renew his supplies until he is seen by RA.  RA schedule is full.  Please advise of an appt.  Thanks  Allergies  Allergen Reactions  . Tomato Rash

## 2013-01-24 NOTE — Telephone Encounter (Signed)
Make appt with TP pl
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
## 2013-01-24 NOTE — Patient Instructions (Signed)
Please contact our office to set up your Defibrillator Implant when your insurance is approved.  Carvedilol, Clopidogrel, Potassium and Protonix have been refilled with a 90 day supply.

## 2013-01-24 NOTE — Assessment & Plan Note (Signed)
He has recovered most of his neurological deficits. He still has facial asymmetry especially around his mouth and has minor speech difficulty. He is due to start speech therapy. I am not convinced that clopidogrel is the best secondary prevention for stroke in his case. Although we never identified a clear embolic source, it makes sense to presume that he had left ventricular apical thrombus and if so, warfarin would provide better protection.

## 2013-01-24 NOTE — Assessment & Plan Note (Addendum)
He meets criteria for primary prevention ICD implantation for non ischemic cardiomyopathy (left ventricular ejection fraction under 35%, heart failure NYHA class II-III, on comprehensive medical therapy , >90 days), but her current financial/insurance limitations to providing this treatment. We spent a long time today discussing the purpose of the defibrillator, its pros and cons, technical details regarding implantation and followup, potential complications of the initial procedure as well as long-term complications related to the generator and leads and unnecessary shocks. He seems to want to have the ICD placed, but wants to wait until his insurance issues are clarified. He sent back to life vest that was prescribed since he could not afford it.

## 2013-01-24 NOTE — Assessment & Plan Note (Signed)
He does not have shortness of breath but has fairly significant lower showed edema and I believe he is hypervolemic. It is possible that he has lost some true weight and that maintaining his "dry weight" around the 250 pound mark may actually be keeping him in heart failure. Aggressive treatment of hypervolemia needs to be balanced by the risk of worsening renal function. His creatinine clearance is around 35 mL per minute. He claims he is compliant with vasodilator therapy) thrice daily hydralazine/nitrate and amlodipine) and his carvedilol and Aldactone. Discuss adjustment of his diuretics with Dr. Herbie Baltimore who is managing his heart failure.

## 2013-01-25 NOTE — Telephone Encounter (Signed)
I spoke with pt and appt scheduled. Nothing further needed 

## 2013-01-28 ENCOUNTER — Encounter: Payer: Self-pay | Admitting: Adult Health

## 2013-01-28 ENCOUNTER — Ambulatory Visit (INDEPENDENT_AMBULATORY_CARE_PROVIDER_SITE_OTHER): Payer: Self-pay | Admitting: Adult Health

## 2013-01-28 VITALS — BP 136/74 | HR 88 | Temp 98.6°F | Ht 68.0 in | Wt 252.2 lb

## 2013-01-28 DIAGNOSIS — G4733 Obstructive sleep apnea (adult) (pediatric): Secondary | ICD-10-CM
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
## 2013-01-28 NOTE — Patient Instructions (Addendum)
Continue on CPAP At bedtime   Keep up good work.  Work on weight loss as tolerated.  Order for CPAP mask and tubing sent to DME  follow up Dr. Vassie Loll  In 6 months and As needed

## 2013-01-28 NOTE — Assessment & Plan Note (Signed)
Good compliance on download  Encouraged on wt loss   Plan  Continue on CPAP At bedtime   Keep up good work.  Work on weight loss as tolerated.  Order for CPAP mask and tubing sent to DME  follow up Dr. Vassie Loll  In 6 months and As needed

## 2013-02-14 ENCOUNTER — Encounter: Payer: Self-pay | Admitting: Cardiology

## 2013-02-14 ENCOUNTER — Ambulatory Visit (INDEPENDENT_AMBULATORY_CARE_PROVIDER_SITE_OTHER): Payer: MEDICAID | Admitting: Cardiology

## 2013-02-14 VITALS — BP 128/80 | HR 88 | Ht 68.0 in | Wt 244.6 lb

## 2013-02-14 DIAGNOSIS — I5042 Chronic combined systolic (congestive) and diastolic (congestive) heart failure: Secondary | ICD-10-CM

## 2013-02-14 DIAGNOSIS — I6350 Cerebral infarction due to unspecified occlusion or stenosis of unspecified cerebral artery: Secondary | ICD-10-CM

## 2013-02-14 DIAGNOSIS — I10 Essential (primary) hypertension: Secondary | ICD-10-CM

## 2013-02-14 DIAGNOSIS — I639 Cerebral infarction, unspecified: Secondary | ICD-10-CM

## 2013-02-14 DIAGNOSIS — N19 Unspecified kidney failure: Secondary | ICD-10-CM

## 2013-02-14 DIAGNOSIS — I428 Other cardiomyopathies: Secondary | ICD-10-CM

## 2013-02-14 DIAGNOSIS — I1310 Hypertensive heart and chronic kidney disease without heart failure, with stage 1 through stage 4 chronic kidney disease, or unspecified chronic kidney disease: Secondary | ICD-10-CM

## 2013-02-14 NOTE — Patient Instructions (Signed)
You look & seem to be feeling great!!!  Keep up the exercise & monitoring your weight.  Set your new "dry weight" based upon what it is today.  We are leaving your medications as they are.  I want you to follow up with Corine Shelter, PA in ~2 months.   Before we actually go forward with a Defibrillator - we need to recheck an echocardiogram if it is > 4-6 months from your hospital stay.  Marykay Lex, MD
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
## 2013-02-16 ENCOUNTER — Encounter: Payer: Self-pay | Admitting: Cardiology

## 2013-02-16 DIAGNOSIS — I639 Cerebral infarction, unspecified: Secondary | ICD-10-CM | POA: Insufficient documentation

## 2013-02-16 NOTE — Assessment & Plan Note (Signed)
He seems to have had a relatively strong recovery only minimal slurred speech and facial droop.  as per the recommendations from neurology, he is on Plavix for stroke prevention. I tend to be in agreement with Dr. Royann Shivers, think high likelihood of having had a cardioembolic stroke of LV thrombus. However no new thrombus is noted. We will continue with the recommendations from neurology.

## 2013-02-16 NOTE — Assessment & Plan Note (Signed)
He has met with Dr. Royann Shivers, currently class II heart failure, but easily within the class III. With his low EF, and no improvement over several years, he would meet criteria for ICD. Once his financial status is more stable. He would like to address this issue.

## 2013-02-16 NOTE — Assessment & Plan Note (Signed)
Dramatically improved blood pressure control with volume removal. For now, continue with current measurement. Would like to add back an ACE inhibitor or ARB once his renal function is totally stabilized.

## 2013-02-16 NOTE — Assessment & Plan Note (Signed)
Improvement of overall symptoms. We are now having to adjust his dry weight lower than his previous weight. We discussed importance of sliding scale Lasix based on weight measurements.  His current weight should be his new dry weight  He has done wonderfully to keep his weight off. He is on a stable dose of carvedilol and hydralazine/Imdur he is also now on spironolactone. He was previously on ACE inhibitor/ARB, this was held when he had acute renal insufficiency in the hospital last time. We may eventually switch back from Imdur hydralazine to an ACE inhibitor, but not yet.

## 2013-02-16 NOTE — Assessment & Plan Note (Signed)
By most recent check, his renal function had improved the creatinine back to 1.7. Closer to his baseline.

## 2013-06-10 ENCOUNTER — Encounter (HOSPITAL_COMMUNITY): Payer: Self-pay | Admitting: Emergency Medicine

## 2013-06-10 ENCOUNTER — Emergency Department (HOSPITAL_COMMUNITY): Payer: Medicaid Other

## 2013-06-10 ENCOUNTER — Inpatient Hospital Stay (HOSPITAL_COMMUNITY)
Admission: EM | Admit: 2013-06-10 | Discharge: 2013-06-15 | DRG: 292 | Disposition: A | Discharge status: Home Health | Payer: Medicaid Other | Attending: Internal Medicine | Admitting: Internal Medicine

## 2013-06-10 DIAGNOSIS — I119 Hypertensive heart disease without heart failure: Secondary | ICD-10-CM

## 2013-06-10 DIAGNOSIS — N189 Chronic kidney disease, unspecified: Secondary | ICD-10-CM

## 2013-06-10 DIAGNOSIS — Z79899 Other long term (current) drug therapy: Secondary | ICD-10-CM

## 2013-06-10 DIAGNOSIS — I5042 Chronic combined systolic (congestive) and diastolic (congestive) heart failure: Secondary | ICD-10-CM

## 2013-06-10 DIAGNOSIS — Z6836 Body mass index (BMI) 36.0-36.9, adult: Secondary | ICD-10-CM

## 2013-06-10 DIAGNOSIS — I509 Heart failure, unspecified: Secondary | ICD-10-CM

## 2013-06-10 DIAGNOSIS — N058 Unspecified nephritic syndrome with other morphologic changes: Secondary | ICD-10-CM | POA: Diagnosis present

## 2013-06-10 DIAGNOSIS — I1310 Hypertensive heart and chronic kidney disease without heart failure, with stage 1 through stage 4 chronic kidney disease, or unspecified chronic kidney disease: Secondary | ICD-10-CM

## 2013-06-10 DIAGNOSIS — IMO0002 Reserved for concepts with insufficient information to code with codable children: Secondary | ICD-10-CM

## 2013-06-10 DIAGNOSIS — Z7902 Long term (current) use of antithrombotics/antiplatelets: Secondary | ICD-10-CM

## 2013-06-10 DIAGNOSIS — E118 Type 2 diabetes mellitus with unspecified complications: Secondary | ICD-10-CM

## 2013-06-10 DIAGNOSIS — N039 Chronic nephritic syndrome with unspecified morphologic changes: Secondary | ICD-10-CM

## 2013-06-10 DIAGNOSIS — Z91199 Patient's noncompliance with other medical treatment and regimen due to unspecified reason: Secondary | ICD-10-CM

## 2013-06-10 DIAGNOSIS — Z9889 Other specified postprocedural states: Secondary | ICD-10-CM

## 2013-06-10 DIAGNOSIS — E1165 Type 2 diabetes mellitus with hyperglycemia: Secondary | ICD-10-CM | POA: Diagnosis present

## 2013-06-10 DIAGNOSIS — Z5987 Material hardship due to limited financial resources, not elsewhere classified: Secondary | ICD-10-CM

## 2013-06-10 DIAGNOSIS — N183 Chronic kidney disease, stage 3 unspecified: Secondary | ICD-10-CM | POA: Diagnosis present

## 2013-06-10 DIAGNOSIS — E785 Hyperlipidemia, unspecified: Secondary | ICD-10-CM | POA: Diagnosis present

## 2013-06-10 DIAGNOSIS — Z598 Other problems related to housing and economic circumstances: Secondary | ICD-10-CM

## 2013-06-10 DIAGNOSIS — I10 Essential (primary) hypertension: Secondary | ICD-10-CM

## 2013-06-10 DIAGNOSIS — E876 Hypokalemia: Secondary | ICD-10-CM | POA: Diagnosis present

## 2013-06-10 DIAGNOSIS — I5043 Acute on chronic combined systolic (congestive) and diastolic (congestive) heart failure: Principal | ICD-10-CM | POA: Diagnosis present

## 2013-06-10 DIAGNOSIS — R0600 Dyspnea, unspecified: Secondary | ICD-10-CM

## 2013-06-10 DIAGNOSIS — Z9119 Patient's noncompliance with other medical treatment and regimen: Secondary | ICD-10-CM

## 2013-06-10 DIAGNOSIS — N289 Disorder of kidney and ureter, unspecified: Secondary | ICD-10-CM

## 2013-06-10 DIAGNOSIS — Z8673 Personal history of transient ischemic attack (TIA), and cerebral infarction without residual deficits: Secondary | ICD-10-CM

## 2013-06-10 DIAGNOSIS — I428 Other cardiomyopathies: Secondary | ICD-10-CM | POA: Diagnosis present

## 2013-06-10 DIAGNOSIS — I130 Hypertensive heart and chronic kidney disease with heart failure and stage 1 through stage 4 chronic kidney disease, or unspecified chronic kidney disease: Secondary | ICD-10-CM | POA: Diagnosis present

## 2013-06-10 DIAGNOSIS — E1121 Type 2 diabetes mellitus with diabetic nephropathy: Secondary | ICD-10-CM | POA: Diagnosis present

## 2013-06-10 DIAGNOSIS — I639 Cerebral infarction, unspecified: Secondary | ICD-10-CM | POA: Diagnosis present

## 2013-06-10 DIAGNOSIS — D649 Anemia, unspecified: Secondary | ICD-10-CM | POA: Diagnosis present

## 2013-06-10 DIAGNOSIS — I5189 Other ill-defined heart diseases: Secondary | ICD-10-CM

## 2013-06-10 DIAGNOSIS — Z91018 Allergy to other foods: Secondary | ICD-10-CM

## 2013-06-10 DIAGNOSIS — R112 Nausea with vomiting, unspecified: Secondary | ICD-10-CM

## 2013-06-10 DIAGNOSIS — G4733 Obstructive sleep apnea (adult) (pediatric): Secondary | ICD-10-CM | POA: Diagnosis present

## 2013-06-10 DIAGNOSIS — Z833 Family history of diabetes mellitus: Secondary | ICD-10-CM

## 2013-06-10 DIAGNOSIS — N19 Unspecified kidney failure: Secondary | ICD-10-CM

## 2013-06-10 DIAGNOSIS — E1129 Type 2 diabetes mellitus with other diabetic kidney complication: Secondary | ICD-10-CM | POA: Diagnosis present

## 2013-06-10 LAB — COMPREHENSIVE METABOLIC PANEL
ALT: 21 U/L (ref 0–53)
AST: 25 U/L (ref 0–37)
Albumin: 3.3 g/dL — ABNORMAL LOW (ref 3.5–5.2)
Alkaline Phosphatase: 119 U/L — ABNORMAL HIGH (ref 39–117)
BUN: 21 mg/dL (ref 6–23)
CO2: 25 mEq/L (ref 19–32)
Calcium: 9 mg/dL (ref 8.4–10.5)
Chloride: 103 mEq/L (ref 96–112)
Creatinine, Ser: 2.11 mg/dL — ABNORMAL HIGH (ref 0.50–1.35)
GFR calc Af Amer: 42 mL/min — ABNORMAL LOW (ref >90)
GFR calc non Af Amer: 36 mL/min — ABNORMAL LOW (ref >90)
Glucose, Bld: 91 mg/dL (ref 70–99)
Potassium: 3.6 mEq/L — ABNORMAL LOW (ref 3.7–5.3)
Sodium: 142 mEq/L (ref 137–147)
Total Bilirubin: 2.6 mg/dL — ABNORMAL HIGH (ref 0.3–1.2)
Total Protein: 7 g/dL (ref 6.0–8.3)

## 2013-06-10 LAB — CBC
HCT: 33.8 % — ABNORMAL LOW (ref 39.0–52.0)
Hemoglobin: 9.9 g/dL — ABNORMAL LOW (ref 13.0–17.0)
MCH: 20 pg — ABNORMAL LOW (ref 26.0–34.0)
MCHC: 29.3 g/dL — ABNORMAL LOW (ref 30.0–36.0)
MCV: 68.3 fL — ABNORMAL LOW (ref 78.0–100.0)
Platelets: 213 10*3/uL (ref 150–400)
RBC: 4.95 MIL/uL (ref 4.22–5.81)
RDW: 20.5 % — ABNORMAL HIGH (ref 11.5–15.5)
WBC: 4.9 10*3/uL (ref 4.0–10.5)

## 2013-06-10 LAB — URINALYSIS, ROUTINE W REFLEX MICROSCOPIC
Glucose, UA: NEGATIVE mg/dL
Hgb urine dipstick: NEGATIVE
Ketones, ur: NEGATIVE mg/dL
Leukocytes, UA: NEGATIVE
Nitrite: NEGATIVE
Protein, ur: >300 mg/dL — AB
Specific Gravity, Urine: 1.022 (ref 1.005–1.030)
Urobilinogen, UA: 4 mg/dL — ABNORMAL HIGH (ref 0.0–1.0)
pH: 5.5 (ref 5.0–8.0)

## 2013-06-10 LAB — URINE MICROSCOPIC-ADD ON

## 2013-06-10 LAB — PRO B NATRIURETIC PEPTIDE: PRO B NATRI PEPTIDE: 3962 pg/mL — AB (ref 0–125)

## 2013-06-10 LAB — POCT I-STAT TROPONIN I: Troponin i, poc: 0.04 ng/mL (ref 0.00–0.08)

## 2013-06-10 MED ORDER — FUROSEMIDE 10 MG/ML IJ SOLN
80.0000 mg | Freq: Once | INTRAMUSCULAR | Status: AC
  Administered 2013-06-10: 80 mg via INTRAVENOUS
  Filled 2013-06-10 (×2): qty 8

## 2013-06-10 MED ORDER — CARVEDILOL PHOSPHATE ER 20 MG PO CP24
20.0000 mg | ORAL_CAPSULE | Freq: Once | ORAL | Status: AC
  Administered 2013-06-10: 20 mg via ORAL
  Filled 2013-06-10: qty 1

## 2013-06-10 MED ORDER — ISOSORBIDE MONONITRATE ER 30 MG PO TB24
30.0000 mg | ORAL_TABLET | Freq: Once | ORAL | Status: AC
  Administered 2013-06-10: 30 mg via ORAL
  Filled 2013-06-10: qty 1

## 2013-06-10 MED ORDER — ISOSORBIDE MONONITRATE ER 60 MG PO TB24
60.0000 mg | ORAL_TABLET | Freq: Every day | ORAL | Status: DC
  Administered 2013-06-11 – 2013-06-15 (×5): 60 mg via ORAL
  Filled 2013-06-10 (×5): qty 1

## 2013-06-10 MED ORDER — HYDRALAZINE HCL 25 MG PO TABS
37.5000 mg | ORAL_TABLET | Freq: Once | ORAL | Status: AC
  Administered 2013-06-10: 37.5 mg via ORAL
  Filled 2013-06-10: qty 1.5

## 2013-06-10 MED ORDER — ACETAMINOPHEN 650 MG RE SUPP: 650.0000 mg | Freq: Four times a day (QID) | RECTAL | Status: DC | PRN

## 2013-06-10 MED ORDER — POTASSIUM CHLORIDE CRYS ER 20 MEQ PO TBCR
40.0000 meq | EXTENDED_RELEASE_TABLET | Freq: Every day | ORAL | Status: DC
  Administered 2013-06-11: 40 meq via ORAL
  Filled 2013-06-10: qty 2

## 2013-06-10 MED ORDER — HYDRALAZINE HCL 50 MG PO TABS
50.0000 mg | ORAL_TABLET | Freq: Three times a day (TID) | ORAL | Status: DC
  Administered 2013-06-10 – 2013-06-11 (×2): 50 mg via ORAL
  Filled 2013-06-10 (×5): qty 1

## 2013-06-10 MED ORDER — FUROSEMIDE 10 MG/ML IJ SOLN
10.0000 mg/h | INTRAVENOUS | Status: AC
  Administered 2013-06-11: 10 mg/h via INTRAVENOUS
  Filled 2013-06-10: qty 25

## 2013-06-10 MED ORDER — ADULT MULTIVITAMIN W/MINERALS CH
1.0000 | ORAL_TABLET | Freq: Every day | ORAL | Status: DC
  Administered 2013-06-11 – 2013-06-15 (×5): 1 via ORAL
  Filled 2013-06-10 (×5): qty 1

## 2013-06-10 MED ORDER — PANTOPRAZOLE SODIUM 40 MG PO TBEC
40.0000 mg | DELAYED_RELEASE_TABLET | Freq: Every day | ORAL | Status: DC
  Administered 2013-06-11 – 2013-06-15 (×5): 40 mg via ORAL
  Filled 2013-06-10 (×5): qty 1

## 2013-06-10 MED ORDER — AMLODIPINE BESYLATE 10 MG PO TABS
10.0000 mg | ORAL_TABLET | Freq: Once | ORAL | Status: AC
  Administered 2013-06-10: 10 mg via ORAL
  Filled 2013-06-10: qty 1

## 2013-06-10 MED ORDER — SODIUM CHLORIDE 0.9 % IJ SOLN
3.0000 mL | Freq: Two times a day (BID) | INTRAMUSCULAR | Status: DC
Start: 2013-06-10 — End: 2013-06-15
  Administered 2013-06-12 – 2013-06-15 (×5): 3 mL via INTRAVENOUS

## 2013-06-10 MED ORDER — POTASSIUM CHLORIDE CRYS ER 20 MEQ PO TBCR
20.0000 meq | EXTENDED_RELEASE_TABLET | Freq: Once | ORAL | Status: AC
  Administered 2013-06-10: 20 meq via ORAL
  Filled 2013-06-10: qty 1

## 2013-06-10 MED ORDER — SPIRONOLACTONE 12.5 MG HALF TABLET
12.5000 mg | ORAL_TABLET | Freq: Every day | ORAL | Status: DC
  Filled 2013-06-10: qty 1

## 2013-06-10 MED ORDER — CARVEDILOL 25 MG PO TABS
25.0000 mg | ORAL_TABLET | Freq: Two times a day (BID) | ORAL | Status: DC
  Administered 2013-06-11 – 2013-06-15 (×9): 25 mg via ORAL
  Filled 2013-06-10 (×12): qty 1

## 2013-06-10 MED ORDER — CLOPIDOGREL BISULFATE 75 MG PO TABS
75.0000 mg | ORAL_TABLET | Freq: Once | ORAL | Status: AC
  Administered 2013-06-10: 75 mg via ORAL
  Filled 2013-06-10: qty 1

## 2013-06-10 MED ORDER — CLOPIDOGREL BISULFATE 75 MG PO TABS
75.0000 mg | ORAL_TABLET | Freq: Every day | ORAL | Status: DC
  Administered 2013-06-11 – 2013-06-15 (×5): 75 mg via ORAL
  Filled 2013-06-10 (×6): qty 1

## 2013-06-10 MED ORDER — HEPARIN SODIUM (PORCINE) 5000 UNIT/ML IJ SOLN
5000.0000 [IU] | Freq: Three times a day (TID) | INTRAMUSCULAR | Status: DC
  Administered 2013-06-10 – 2013-06-15 (×13): 5000 [IU] via SUBCUTANEOUS
  Filled 2013-06-10 (×17): qty 1

## 2013-06-10 MED ORDER — SPIRONOLACTONE 12.5 MG HALF TABLET
12.5000 mg | ORAL_TABLET | Freq: Every day | ORAL | Status: DC
  Administered 2013-06-10 – 2013-06-13 (×4): 12.5 mg via ORAL
  Filled 2013-06-10 (×5): qty 1

## 2013-06-10 MED ORDER — ACETAMINOPHEN 325 MG PO TABS
650.0000 mg | ORAL_TABLET | Freq: Four times a day (QID) | ORAL | Status: DC | PRN
  Administered 2013-06-11: 650 mg via ORAL
  Filled 2013-06-10 (×2): qty 2

## 2013-06-10 MED ORDER — AMLODIPINE BESYLATE 10 MG PO TABS
10.0000 mg | ORAL_TABLET | Freq: Every day | ORAL | Status: DC
Start: 2013-06-11 — End: 2013-06-15
  Administered 2013-06-11 – 2013-06-15 (×5): 10 mg via ORAL
  Filled 2013-06-10 (×5): qty 1

## 2013-06-10 NOTE — ED Notes (Signed)
Per pt, has a history of CHF-has gained 4 pound in last 24 hours-SOB-has been out of meds for 2 weeks

## 2013-06-10 NOTE — Discharge Instructions (Signed)
Heart Failure Heart failure is a condition in which the heart has trouble pumping blood. This means your heart does not pump blood efficiently for your body to work well. In some cases of heart failure, fluid may back up into your lungs or you may have swelling (edema) in your lower legs. Heart failure is usually a long-term (chronic) condition. It is important for you to take good care of yourself and follow your caregiver's treatment plan. CAUSES  Some health conditions can cause heart failure. Those health conditions include:  High blood pressure (hypertension) causes the heart muscle to work harder than normal. When pressure in the blood vessels is high, the heart needs to pump (contract) with more force in order to circulate blood throughout the body. High blood pressure eventually causes the heart to become stiff and weak.  Coronary artery disease (CAD) is the buildup of cholesterol and fat (plaque) in the arteries of the heart. The blockage in the arteries deprives the heart muscle of oxygen and blood. This can cause chest pain and may lead to a heart attack. High blood pressure can also contribute to CAD.  Heart attack (myocardial infarction) occurs when 1 or more arteries in the heart become blocked. The loss of oxygen damages the muscle tissue of the heart. When this happens, part of the heart muscle dies. The injured tissue does not contract as well and weakens the heart's ability to pump blood.  Abnormal heart valves can cause heart failure when the heart valves do not open and close properly. This makes the heart muscle pump harder to keep the blood flowing.  Heart muscle disease (cardiomyopathy or myocarditis) is damage to the heart muscle from a variety of causes. These can include drug or alcohol abuse, infections, or unknown reasons. These can increase the risk of heart failure.  Lung disease makes the heart work harder because the lungs do not work properly. This can cause a strain  on the heart, leading it to fail.  Diabetes increases the risk of heart failure. High blood sugar contributes to high fat (lipid) levels in the blood. Diabetes can also cause slow damage to tiny blood vessels that carry important nutrients to the heart muscle. When the heart does not get enough oxygen and food, it can cause the heart to become weak and stiff. This leads to a heart that does not contract efficiently.  Other conditions can contribute to heart failure. These include abnormal heart rhythms, thyroid problems, and low blood counts (anemia). Certain unhealthy behaviors can increase the risk of heart failure. Those unhealthy behaviors include:  Being overweight.  Smoking or chewing tobacco.  Eating foods high in fat and cholesterol.  Abusing illicit drugs or alcohol.  Lacking physical activity. SYMPTOMS  Heart failure symptoms may vary and can be hard to detect. Symptoms may include:  Shortness of breath with activity, such as climbing stairs.  Persistent cough.  Swelling of the feet, ankles, legs, or abdomen.  Unexplained weight gain.  Difficulty breathing when lying flat (orthopnea).  Waking from sleep because of the need to sit up and get more air.  Rapid heartbeat.  Fatigue and loss of energy.  Feeling lightheaded, dizzy, or close to fainting.  Loss of appetite.  Nausea.  Increased urination during the night (nocturia). DIAGNOSIS  A diagnosis of heart failure is based on your history, symptoms, physical examination, and diagnostic tests. Diagnostic tests for heart failure may include:  Echocardiography.  Electrocardiography.  Chest X-ray.  Blood tests.  Exercise   stress test.  Cardiac angiography.  Radionuclide scans. TREATMENT  Treatment is aimed at managing the symptoms of heart failure. Medicines, behavioral changes, or surgical intervention may be necessary to treat heart failure.  Medicines to help treat heart failure may  include:  Angiotensin-converting enzyme (ACE) inhibitors. This type of medicine blocks the effects of a blood protein called angiotensin-converting enzyme. ACE inhibitors relax (dilate) the blood vessels and help lower blood pressure.  Angiotensin receptor blockers. This type of medicine blocks the actions of a blood protein called angiotensin. Angiotensin receptor blockers dilate the blood vessels and help lower blood pressure.  Water pills (diuretics). Diuretics cause the kidneys to remove salt and water from the blood. The extra fluid is removed through urination. This loss of extra fluid lowers the volume of blood the heart pumps.  Beta blockers. These prevent the heart from beating too fast and improve heart muscle strength.  Digitalis. This increases the force of the heartbeat.  Healthy behavior changes include:  Obtaining and maintaining a healthy weight.  Stopping smoking or chewing tobacco.  Eating heart healthy foods.  Limiting or avoiding alcohol.  Stopping illicit drug use.  Physical activity as directed by your caregiver.  Surgical treatment for heart failure may include:  A procedure to open blocked arteries, repair damaged heart valves, or remove damaged heart muscle tissue.  A pacemaker to improve heart muscle function and control certain abnormal heart rhythms.  An internal cardioverter defibrillator to treat certain serious abnormal heart rhythms.  A left ventricular assist device to assist the pumping ability of the heart. HOME CARE INSTRUCTIONS   Take your medicine as directed by your caregiver. Medicines are important in reducing the workload of your heart, slowing the progression of heart failure, and improving your symptoms.  Do not stop taking your medicine unless directed by your caregiver.  Do not skip any dose of medicine.  Refill your prescriptions before you run out of medicine. Your medicines are needed every day.  Take over-the-counter  medicine only as directed by your caregiver or pharmacist.  Engage in moderate physical activity if directed by your caregiver. Moderate physical activity can benefit some people. The elderly and people with severe heart failure should consult with a caregiver for physical activity recommendations.  Eat heart healthy foods. Food choices should be free of trans fat and low in saturated fat, cholesterol, and salt (sodium). Healthy choices include fresh or frozen fruits and vegetables, fish, lean meats, legumes, fat-free or low-fat dairy products, and whole grain or high fiber foods. Talk to a dietitian to learn more about heart healthy foods.  Limit sodium if directed by your caregiver. Sodium restriction may reduce symptoms of heart failure in some people. Talk to a dietitian to learn more about heart healthy seasonings.  Use healthy cooking methods. Healthy cooking methods include roasting, grilling, broiling, baking, poaching, steaming, or stir-frying. Talk to a dietitian to learn more about healthy cooking methods.  Limit fluids if directed by your caregiver. Fluid restriction may reduce symptoms of heart failure in some people.  Weigh yourself every day. Daily weights are important in the early recognition of excess fluid. You should weigh yourself every morning after you urinate and before you eat breakfast. Wear the same amount of clothing each time you weigh yourself. Record your daily weight. Provide your caregiver with your weight record.  Monitor and record your blood pressure if directed by your caregiver.  Check your pulse if directed by your caregiver.  Lose weight if directed   by your caregiver. Weight loss may reduce symptoms of heart failure in some people.  Stop smoking or chewing tobacco. Nicotine makes your heart work harder by causing your blood vessels to constrict. Do not use nicotine gum or patches before talking to your caregiver.  Schedule and attend follow-up visits as  directed by your caregiver. It is important to keep all your appointments.  Limit alcohol intake to no more than 1 drink per day for nonpregnant women and 2 drinks per day for men. Drinking more than that is harmful to your heart. Tell your caregiver if you drink alcohol several times a week. Talk with your caregiver about whether alcohol is safe for you. If your heart has already been damaged by alcohol or you have severe heart failure, drinking alcohol should be stopped completely.  Stop illicit drug use.  Stay up-to-date with immunizations. It is especially important to prevent respiratory infections through current pneumococcal and influenza immunizations.  Manage other health conditions such as hypertension, diabetes, thyroid disease, or abnormal heart rhythms as directed by your caregiver.  Learn to manage stress.  Plan rest periods when fatigued.  Learn strategies to manage high temperatures. If the weather is extremely hot:  Avoid vigorous physical activity.  Use air conditioning or fans or seek a cooler location.  Avoid caffeine and alcohol.  Wear loose-fitting, lightweight, and light-colored clothing.  Learn strategies to manage cold temperatures. If the weather is extremely cold:  Avoid vigorous physical activity.  Layer clothes.  Wear mittens or gloves, a hat, and a scarf when going outside.  Avoid alcohol.  Obtain ongoing education and support as needed.  Participate or seek rehabilitation as needed to maintain or improve independence and quality of life. SEEK MEDICAL CARE IF:   Your weight increases by 03 lb/1.4 kg in 1 day or 05 lb/2.3 kg in a week.  You have increasing shortness of breath that is unusual for you.  You are unable to participate in your usual physical activities.  You tire easily.  You cough more than normal, especially with physical activity.  You have any or more swelling in areas such as your hands, feet, ankles, or abdomen.  You  are unable to sleep because it is hard to breathe.  You feel like your heart is beating fast (palpitations).  You become dizzy or lightheaded upon standing up. SEEK IMMEDIATE MEDICAL CARE IF:   You have difficulty breathing.  There is a change in mental status such as decreased alertness or difficulty with concentration.  You have a pain or discomfort in your chest.  You have an episode of fainting (syncope). MAKE SURE YOU:   Understand these instructions.  Will watch your condition.  Will get help right away if you are not doing well or get worse. Document Released: 04/11/2005 Document Revised: 08/06/2012 Document Reviewed: 05/03/2012 Exit[employer] Patient Information 2014 [employer]. Hypertension As your heart beats, it forces blood through your arteries. This force is your blood pressure. If the pressure is too high, it is called hypertension (HTN) or high blood pressure. HTN is dangerous because you may have it and not know it. High blood pressure may mean that your heart has to work harder to pump blood. Your arteries may be narrow or stiff. The extra work puts you at risk for heart disease, stroke, and other problems.  Blood pressure consists of two numbers, a higher number over a lower, 110/72, for example. It is stated as "110 over 72." The ideal is below   120 for the top number (systolic) and under 80 for the bottom (diastolic). Write down your blood pressure today. You should pay close attention to your blood pressure if you have certain conditions such as:  Heart failure.  Prior heart attack.  Diabetes  Chronic kidney disease.  Prior stroke.  Multiple risk factors for heart disease. To see if you have HTN, your blood pressure should be measured while you are seated with your arm held at the level of the heart. It should be measured at least twice. A one-time elevated blood pressure reading (especially in the Emergency Department) does not mean that you need treatment.  There may be conditions in which the blood pressure is different between your right and left arms. It is important to see your caregiver soon for a recheck. Most people have essential hypertension which means that there is not a specific cause. This type of high blood pressure may be lowered by changing lifestyle factors such as:  Stress.  Smoking.  Lack of exercise.  Excessive weight.  Drug/tobacco/alcohol use.  Eating less salt. Most people do not have symptoms from high blood pressure until it has caused damage to the body. Effective treatment can often prevent, delay or reduce that damage. TREATMENT  When a cause has been identified, treatment for high blood pressure is directed at the cause. There are a large number of medications to treat HTN. These fall into several categories, and your caregiver will help you select the medicines that are best for you. Medications may have side effects. You should review side effects with your caregiver. If your blood pressure stays high after you have made lifestyle changes or started on medicines,   Your medication(s) may need to be changed.  Other problems may need to be addressed.  Be certain you understand your prescriptions, and know how and when to take your medicine.  Be sure to follow up with your caregiver within the time frame advised (usually within two weeks) to have your blood pressure rechecked and to review your medications.  If you are taking more than one medicine to lower your blood pressure, make sure you know how and at what times they should be taken. Taking two medicines at the same time can result in blood pressure that is too low. SEEK IMMEDIATE MEDICAL CARE IF:  You develop a severe headache, blurred or changing vision, or confusion.  You have unusual weakness or numbness, or a faint feeling.  You have severe chest or abdominal pain, vomiting, or breathing problems. MAKE SURE YOU:   Understand these  instructions.  Will watch your condition.  Will get help right away if you are not doing well or get worse. Document Released: 04/11/2005 Document Revised: 07/04/2011 Document Reviewed: 11/30/2007 ExitCare Patient Information 2014 [employer].  
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
## 2013-06-10 NOTE — Progress Notes (Signed)
   CARE MANAGEMENT ED NOTE 06/10/2013  Patient:  Devon Ortiz, Devon Ortiz   Account Number:  [id number]  Date Initiated:  06/10/2013  Documentation initiated by:  Edd Arbour  Subjective/Objective Assessment:   48 yr old self [place] resident c/o CHF-has gained 4 pound in last 24 hours-SOB-has been out of meds for 2 weeks     Subjective/Objective Assessment Detail:   Cm consult from Dr Juleen China for medication assistance  MATCH letter provided to pt 01/07/13 therefore pt is not eligible for [facility] assistance until 09/15  Pt confirmed with CM he received assistance in 09/14 pcp confirmed as Dr Concepcion Elk.  pharmacy of choice confirmed as [facility] & he does receive as many medications as possible from [facility] med list  Pt preference is for delivery of pt assistance meds to his CV- Dr Susette Racer vs pcp Reports cost issues with norvasc, coreg, plavix  states his pharmacy will not prescribe lasix quantity so he can increase his tablets to decrease the fluids  has filled for disability & medicaid Both pending >90 days  states he is compliant with diet but not fluid intake >56 ounces     Action/Plan:   CM spoke with Dr Juleen China, reviewed EPIC notes, spoke with pt to discuss he is not eligible for MATCH at this time & to review most expense meds  Discussed [place] patient assistance program with delivery of meds to provider office   Action/Plan Detail:   Provided pt with outreachRx pt assist application to complete & have CV sign Provided discounted coupons for plavix ($[facility]), norvasc ($6.40 [facility]) & protonix ($10.26), Provided [facility] list highlighting his meds availability at $4   Anticipated DC Date:  06/10/2013     Status Recommendation to Physician:   Result of Recommendation:    Other ED Services  Consult Working Plan    DC Planning Services  Other  Medication Assistance    Choice offered to / List presented to:            Status of service:  Completed, signed off  ED  Comments:   ED Comments Detail:  06/10/13 Cm provided pt with a list of [employer] including [employer] to assist with finances for co pays.  DSS information Adult care/wellness center, and other financial resources.  Pt voiced appreciation of resources. CM discussed with Dr Juleen China pt voiced concern with his fluid intake and concern with pharmacy inability to provide increase quantity of lasix Discussed compliance with present chf lasix program

## 2013-06-10 NOTE — ED Notes (Signed)
Called pharmacy about getting Medications sent up here.
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
## 2013-06-10 NOTE — ED Notes (Signed)
Patient transported to X-ray 

## 2013-06-11 DIAGNOSIS — E1121 Type 2 diabetes mellitus with diabetic nephropathy: Secondary | ICD-10-CM | POA: Diagnosis present

## 2013-06-11 DIAGNOSIS — I5023 Acute on chronic systolic (congestive) heart failure: Secondary | ICD-10-CM

## 2013-06-11 DIAGNOSIS — I509 Heart failure, unspecified: Secondary | ICD-10-CM

## 2013-06-11 LAB — GLUCOSE, CAPILLARY: Glucose-Capillary: 117 mg/dL — ABNORMAL HIGH (ref 70–99)

## 2013-06-11 MED ORDER — POTASSIUM CHLORIDE CRYS ER 20 MEQ PO TBCR
40.0000 meq | EXTENDED_RELEASE_TABLET | Freq: Two times a day (BID) | ORAL | Status: DC
  Administered 2013-06-11 – 2013-06-12 (×2): 40 meq via ORAL
  Filled 2013-06-11 (×3): qty 2

## 2013-06-11 MED ORDER — DEXTROSE 5 % IV SOLN
15.0000 mg/h | INTRAVENOUS | Status: DC
  Administered 2013-06-11 – 2013-06-13 (×3): 15 mg/h via INTRAVENOUS
  Filled 2013-06-11 (×7): qty 25

## 2013-06-11 MED ORDER — INSULIN ASPART 100 UNIT/ML ~~LOC~~ SOLN: 0.0000 [IU] | Freq: Three times a day (TID) | SUBCUTANEOUS | Status: DC

## 2013-06-11 MED ORDER — LISINOPRIL 5 MG PO TABS
5.0000 mg | ORAL_TABLET | Freq: Two times a day (BID) | ORAL | Status: DC
  Filled 2013-06-11: qty 1

## 2013-06-11 MED ORDER — LISINOPRIL 5 MG PO TABS
5.0000 mg | ORAL_TABLET | Freq: Two times a day (BID) | ORAL | Status: DC
  Filled 2013-06-11 (×2): qty 1

## 2013-06-11 MED ORDER — INSULIN ASPART 100 UNIT/ML ~~LOC~~ SOLN: 0.0000 [IU] | Freq: Every day | SUBCUTANEOUS | Status: DC

## 2013-06-11 MED ORDER — METOLAZONE 2.5 MG PO TABS
2.5000 mg | ORAL_TABLET | Freq: Every day | ORAL | Status: DC
  Administered 2013-06-11 – 2013-06-13 (×3): 2.5 mg via ORAL
  Filled 2013-06-11 (×4): qty 1

## 2013-06-11 MED ORDER — HYDRALAZINE HCL 50 MG PO TABS
75.0000 mg | ORAL_TABLET | Freq: Three times a day (TID) | ORAL | Status: DC
  Administered 2013-06-11 – 2013-06-15 (×7): 75 mg via ORAL
  Filled 2013-06-11 (×15): qty 1

## 2013-06-11 MED ORDER — ATORVASTATIN CALCIUM 40 MG PO TABS
40.0000 mg | ORAL_TABLET | Freq: Every day | ORAL | Status: DC
  Administered 2013-06-11 – 2013-06-15 (×5): 40 mg via ORAL
  Filled 2013-06-11 (×5): qty 1

## 2013-06-11 NOTE — Progress Notes (Signed)
Echocardiogram 2D Echocardiogram has been performed.  Dorothey Baseman 06/11/2013, 9:56 AM

## 2013-06-11 NOTE — Progress Notes (Signed)
Referring Physician: [facility] Cardiologist: Herbie Baltimore   HPI:  Devon Ortiz is a 48 y.o., obese male, with a history significant for systolic/diastolic HF due to NICM (EF 30-35%), HTN, previous CVA, CKD (baseline Cr ~2.0) and OSA. Admitted with recurrent HF.   Cardiac cath on 01/21/11. Totally normal codominant system.  Echo 9/14 EF 30-35% RV mildly HK  Has been following closely with Dr. Herbie Baltimore in clinic and typically does very well with sliding scale diuretics. Reports that he weighs daily with baseline weight of 242. From previous discharges baseline weight appears to be about 250.  Says he takes extra lasix a couple times a month for volume overload but recently ran out of lasix and couldn't get a refill because he wasn't at 30-day mark yet. Weight continued to rise and developed progressive DOE to the point where he couldn't walk more than a few feet without DOE. +edema, ab bloating.   Has not been on ACE due to CKD. Has been evaluated for ICD but waiting for insurance.   Admitted with ADHF weight up to 286. Class IIIB symptoms. pBNP 3962 (up from 884). Placed on lasix drip at 10 and beginning to diurese.   Review of Systems:     Cardiac Review of Systems: {Y] = yes [ ]  = no  Chest Pain [    ]  Resting SOB [   ] Exertional SOB  Cove.Etienne  ]  Orthopnea [ y ]   Pedal Edema [  y ]    Palpitations [  ] Syncope  [  ]   Presyncope [   ]  General Review of Systems: [Y] = yes [  ]=no Constitional: recent weight change [ y ]; anorexia [  ]; fatigue [  ]; nausea [  ]; night sweats [  ]; fever [  ]; or chills [  ];                                                                                                                                          Dental: poor dentition[  ];   Eye : blurred vision [  ]; diplopia [   ]; vision changes [  ];  Amaurosis fugax[  ]; Resp: cough [  ];  wheezing[  ];  hemoptysis[  ]; shortness of breath[ y ]; paroxysmal nocturnal dyspnea[  ]; dyspnea on exertion[y  ];  or orthopnea[  ];  GI:  gallstones[  ], vomiting[  ];  dysphagia[  ]; melena[  ];  hematochezia [  ]; heartburn[  ];    GU: kidney stones [  ]; hematuria[  ];   dysuria [  ];  nocturia[  ];  history of     obstruction [  ];                 Skin: rash, swelling[  ];, hair loss[  ];  peripheral edema[  ];  or itching[  ]; Musculosketetal: myalgias[  ];  joint swelling[  ];  joint erythema[  ];  joint pain[ y ];  back pain[  ];  Heme/Lymph: bruising[  ];  bleeding[  ];  anemia[  ];  Neuro: TIA[  ];  headaches[  ];  stroke[  y];  vertigo[  ];  seizures[  ];   paresthesias[  ];  difficulty walking[  ];  Psych:depression[  ]; anxiety[  ];  Endocrine: diabetes[  ];  thyroid dysfunction[  ];  Other:  Past Medical History  Diagnosis Date  . Non-ischemic cardiomyopathy 12/2010    2D echo - EF 45-50, Grade 1 D Dysfxn; CATH with no CAD  . Abnormal echocardiogram 07/2012    EF 30-35%, mod Conc LVH; Grade 2 D Dysfunction (Pseudonormal), Mod MR/TR, PAP ~58 mmHg; Mod LA dilation  . S/P cardiac catheterization [phone]    Myoview with ? Inferior Ischemia: -- CATH- Large, draping coronary arteries, No CAD.  Marland Kitchen Chronic combined systolic and diastolic HF (heart failure), NYHA class 2 01/21/11  . Hypertension with target organ involvement     Cardiomyopathy, CKD III  . Morbid obesity with BMI of 40.0-44.9, adult   . OSA (obstructive sleep apnea)     on C-pap, sleep study was performed 03/10/09, REM sleep 86 minutes  . 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 History of hypokalemia   . H/O noncompliance with medical treatment, presenting hazards to health 12/26/2012    Medications Prior to Admission  Medication Sig Dispense Refill  . amLODipine (NORVASC) 10 MG tablet Take 10 mg by mouth daily.      . carvedilol (COREG) 25 MG tablet Take 1 tablet (25 mg total) by mouth 2 (two) times daily with a meal.  180 tablet  3  . clopidogrel (PLAVIX) 75 MG tablet Take 1 tablet (75 mg total) by mouth  daily with breakfast.  90 tablet  3  . furosemide (LASIX) 80 MG tablet Take 80 mg by mouth daily. Increase dose to 2 tablets if weight is greater than 3 lbs over dry weight.      . hydrALAZINE (APRESOLINE) 25 MG tablet Take 1.5 tablets (37.5 mg total) by mouth 2 (two) times daily.  90 tablet  5  . isosorbide mononitrate (IMDUR) 30 MG 24 hr tablet Take 30 mg by mouth daily.      . Multiple Vitamin (MULTIVITAMIN WITH MINERALS) TABS Take 1 tablet by mouth daily.      . pantoprazole (PROTONIX) 40 MG tablet Take 1 tablet (40 mg total) by mouth daily.  90 tablet  3  . potassium chloride SA (K-DUR,KLOR-CON) 20 MEQ tablet Take 1 tablet (20 mEq total) by mouth daily.  90 tablet  3  . spironolactone (ALDACTONE) 25 MG tablet Take 0.5 tablets (12.5 mg total) by mouth daily.  30 tablet  1     . amLODipine  10 mg Oral Daily  . carvedilol  25 mg Oral BID WC  . clopidogrel  75 mg Oral Q breakfast  . heparin  5,000 Units Subcutaneous 3 times per day  . hydrALAZINE  50 mg Oral 3 times per day  . isosorbide mononitrate  60 mg Oral Daily  . multivitamin with minerals  1 tablet Oral Daily  . pantoprazole  40 mg Oral Daily  . potassium chloride SA  40 mEq Oral Daily  . sodium chloride  3 mL Intravenous Q12H  . spironolactone  12.5  mg Oral Daily    Infusions:    Allergies  Allergen Reactions  . Tomato Rash    History   Social History  . Marital Status: Married    Spouse Name: N/A    Number of Children: N/A  . Years of Education: N/A   Occupational History  . Not on file.   Social History Main Topics  . Smoking status: Never Smoker   . Smokeless tobacco: Never Used  . Alcohol Use: No  . Drug Use: No  . Sexual Activity: No   Other Topics Concern  . Not on file   Social History Narrative  . No narrative on file    Family History  Problem Relation Age of Onset  . Migraines Mother   . Diabetes Maternal Grandmother   . Kidney disease Maternal Grandmother     PHYSICAL EXAM: Filed  Vitals:   06/11/13 1107  BP: 140/100  Pulse: 82  Temp:   Resp:      Intake/Output Summary (Last 24 hours) at 06/11/13 1319 Last data filed at 06/11/13 1100  Gross per 24 hour  Intake    664 ml  Output   2710 ml  Net  -2046 ml    General:  Well appearing. No respiratory difficulty HEENT: normal Neck: supple. JVP jaw. Carotids 2+ bilat; no bruits. No lymphadenopathy or thryomegaly appreciated. Cor: PMI nonpalpable. Distant HS. Regular rate & rhythm. No rubs, gallops or murmurs. Lungs: clear Abdomen: soft, nontender, distended. No hepatosplenomegaly. No bruits or masses. Good bowel sounds. Extremities: no cyanosis, clubbing, rash, 3+ edema Neuro: alert & oriented x 3, cranial nerves grossly intact. moves all 4 extremities w/o difficulty. Affect pleasant.  ECG: SR 96 with LVH and repol. QRS 95  Results for orders placed during the hospital encounter of 06/10/13 (from the past 24 hour(s))  CBC     Status: Abnormal   Collection Time    06/10/13  5:40 PM      Result Value Ref Range   WBC 4.9  4.0 - 10.5 K/uL   RBC 4.95  4.22 - 5.81 MIL/uL   Hemoglobin 9.9 (*) 13.0 - 17.0 g/dL   HCT 16.133.8 (*) 09.639.0 - 04.552.0 %   MCV 68.3 (*) 78.0 - 100.0 fL   MCH 20.0 (*) 26.0 - 34.0 pg   MCHC 29.3 (*) 30.0 - 36.0 g/dL   RDW 40.920.5 (*) 81.111.5 - 91.415.5 %   Platelets 213  150 - 400 K/uL  COMPREHENSIVE METABOLIC PANEL     Status: Abnormal   Collection Time    06/10/13  5:40 PM      Result Value Ref Range   Sodium 142  137 - 147 mEq/L   Potassium 3.6 (*) 3.7 - 5.3 mEq/L   Chloride 103  96 - 112 mEq/L   CO2 25  19 - 32 mEq/L   Glucose, Bld 91  70 - 99 mg/dL   BUN 21  6 - 23 mg/dL   Creatinine, Ser 7.822.11 (*) 0.50 - 1.35 mg/dL   Calcium 9.0  8.4 - 95.610.5 mg/dL   Total Protein 7.0  6.0 - 8.3 g/dL   Albumin 3.3 (*) 3.5 - 5.2 g/dL   AST 25  0 - 37 U/L   ALT 21  0 - 53 U/L   Alkaline Phosphatase 119 (*) 39 - 117 U/L   Total Bilirubin 2.6 (*) 0.3 - 1.2 mg/dL   GFR calc non Af Amer 36 (*) >90 mL/min   GFR  calc  Af Amer 42 (*) >90 mL/min  PRO B NATRIURETIC PEPTIDE     Status: Abnormal   Collection Time    06/10/13  5:40 PM      Result Value Ref Range   Pro B Natriuretic peptide (BNP) 3962.0 (*) 0 - 125 pg/mL  POCT I-STAT TROPONIN I     Status: None   Collection Time    06/10/13  5:46 PM      Result Value Ref Range   Troponin i, poc 0.04  0.00 - 0.08 ng/mL   Comment 3           URINALYSIS, ROUTINE W REFLEX MICROSCOPIC     Status: Abnormal   Collection Time    06/10/13  6:01 PM      Result Value Ref Range   Color, Urine AMBER (*) YELLOW   APPearance CLEAR  CLEAR   Specific Gravity, Urine 1.022  1.005 - 1.030   pH 5.5  5.0 - 8.0   Glucose, UA NEGATIVE  NEGATIVE mg/dL   Hgb urine dipstick NEGATIVE  NEGATIVE   Bilirubin Urine SMALL (*) NEGATIVE   Ketones, ur NEGATIVE  NEGATIVE mg/dL   Protein, ur >027 (*) NEGATIVE mg/dL   Urobilinogen, UA 4.0 (*) 0.0 - 1.0 mg/dL   Nitrite NEGATIVE  NEGATIVE   Leukocytes, UA NEGATIVE  NEGATIVE  URINE MICROSCOPIC-ADD ON     Status: Abnormal   Collection Time    06/10/13  6:01 PM      Result Value Ref Range   RBC / HPF 0-2  <3 RBC/hpf   Bacteria, UA FEW (*) RARE   Urine-Other MUCOUS PRESENT     Dg Chest 2 View  06/10/2013   CLINICAL DATA:  Shortness of breath and cough  EXAM: CHEST  2 VIEW  COMPARISON:  12/26/2012  FINDINGS: Cardiac shadow remains enlarged. The lungs are well aerated bilaterally. Patchy changes are again seen in the right lung base likely related to scarring given their chronicity. No new focal infiltrate is seen. No acute bony abnormality is noted.  IMPRESSION: Chronic changes in the right base likely related to scarring given the chronicity.   Electronically Signed   By: Alcide Clever M.D.   On: 06/10/2013 18:52     ASSESSMENT: 1. A/c systolic HF - baseline NYHA II -> now IIIB/IV 2. Chronic renal failure , stage III 3. NICM (likely hypertensive) EF 30-35% 4. Morbid obesity 5. DM2 with previous HgBA1c 6.5 (he says he has never been  told he is a diabetic) 6. HTN with significant proteinuria.  7. CVA - now on plavix. Will add statin.   PLAN/DISCUSSION:   He is markedly volume overloaded.  Will increase lasix drip to 15. Add metolazone. Would likely benefit from ACE-I/ARB but will wait until he is more completely diuresed. Continue b-blocker, hydralazine/nitrates.  Long talk about need for compliance and role of HF Clinic. We will enroll him in Clinic at d/c. Will also need to help him with insurance issues so he can get ICD.   Work on BP control. Will also likely need meds for DM.   HF team will follow. Consider GUIDE-IT trial.  Truman Hayward 1:33 PM

## 2013-06-11 NOTE — Care Management Note (Addendum)
  Page 2 of 2   06/14/2013     3:55:28 PM   CARE MANAGEMENT NOTE 06/14/2013  Patient:  Devon Ortiz, Devon Ortiz   Account Number:  [id number]  Date Initiated:  06/11/2013  Documentation initiated by:  Resa Rinks  Subjective/Objective Assessment:   Admitted with CHF     Action/Plan:   follow for disposition needs   Anticipated DC Date:  06/14/2013   Anticipated DC Plan:  HOME/SELF CARE      DC Planning Services  CM consult  Medication Assistance      Choice offered to / List presented to:  NA           Status of service:  In process, will continue to follow Medicare Important Message given?   (If response is "NO", the following Medicare IM given date fields will be blank) Date Medicare IM given:   Date Additional Medicare IM given:    Discharge Disposition:    Per UR Regulation:  Reviewed for med. necessity/level of care/duration of stay  If discussed at Long Length of Stay Meetings, dates discussed:    Comments:  06/09/2013 IV lasix d/c today and changed to POs Plan to watch x 1 day and d/c tomorrow if BMAP ok. CM has provided ed on $4.00 med list this admission beginning in the ED with continued supportive ed by CM/[facility] Disposition Plan:  $4.00 med list, CHF Clinic Juno Bozard RN, BSN, [facility], [place] 06/14/2013  06/11/2013 CHF, wt up to 286/250 baseline.   Increased edema in feet and abd.  Hx/o past Stroke with improved CVA symptoms. Social:  From home alone Home DME:  CPap Meds:  Medication non-compliance d/t running out of meds and taking meds/Lasix more than Rx was written for. MD has provided patient ed on med compliance and how to manage need for refills. MCD pending status since Sept/Oct 2014. CM provided VE on Rx'd meds and $4.00 pharmacy list.  MD has also instructed to contact CHF Clinic if any problems getting medications.  CM has provided printed list form [employer] and [employer]. CHF Clinic initiated/ Dr. Gala Romney ADD: [id number] RN, BSN,  [place], [place] 06/11/2013

## 2013-06-11 NOTE — Progress Notes (Addendum)
[facility] PROGRESS NOTE Interim History: 48 year old obese meal with history of nonischemic cardiomyopathy NYHA class II (EF of 30-35% from echo in September 2014), uncontrolled hypertension, chronic kidney disease stage III, obstructive sleep apnea, nonobstructive CAD as per cardiac cath in 2012 , noncompliance with medications due to issues with medication affordability presented to the ED today with progressive shortness of breath for past 2 days. Patient at baseline has NYHA class II dyspnea but for past 2 days he has been short of breath on minimal exertion and even at rest since this morning. He denies any orthopnea or PND. He denies any recent illness or sick contacts. For past 2 weeks he has not taken any of these medications except for occasional Lasix as he has undergone out of all his medications. For past 2 weeks he has also noticed increased swelling of his legs and abdominal girth. He reports that his dry weight was 240 pounds in October and today it is 260 pounds..  Patient denies headache, dizziness, fever, chills, nausea , vomiting, chest pain, palpitations, SOB, abdominal pain, bowel or urinary symptoms. Denies change in appetite.  Still Fluid overload; on lasix drip  Filed Weights   06/10/13 1754 06/11/13 0112  Weight: 117.935 kg (260 lb) 129.9 kg (286 lb 6 oz)        Intake/Output Summary (Last 24 hours) at 06/11/13 2139 Last data filed at 06/11/13 2044  Gross per 24 hour  Intake 1516.67 ml  Output   4010 ml  Net -2493.33 ml     Assessment/Plan: 1-CHF exacerbation: acute on chronic systolic heart failure. Appears to be due to issues with medication compliance and diet indiscretion. -continue daily weight, strict intake and output, low sodium diet -currently on lasix drip -heart failure team on board, will follow rec's -once closer to dry weight and if renal failure stable will need ACE and or ARB -continue b-blocker -2D echo pending  2-CVA (cerebral  infarction)- symptoms 01/01/13- scattered sm. embolic infarcts -no new neurologic deficit -continue plavix for secondary prevention  3-Cardiorenal syndrome with renal failure: continue low sodium diet and lasix drip. -close follow up of electrolytes and renal function  4-CKD (chronic kidney disease), stage III: due to HTN and diabetes. -now worse from cardiorenal syndrome -close follow up to renal function and electrolytes -continue diuresis  5-Accelerated essential hypertension: due to lack of compliance with meds given difficulty to afford them. -will resume meds and slowly titrate them up for better control -patient also on lasix drip now -encourage weight loss and low sodium diet  6-OSA (obstructive sleep apnea) -  -continue CPAP QHS  7-HLD: continue statins  8-morbidly obesity: low calorie diet and exercise discussed with patient.  9-GERD: -continue PPI  10-diabetes: last A1C was 6.5; currently not on meds -will repeat A1C and start SSI while inpatient  AVW:[id number]:Heparin    Code Status: Full Family Communication: no family at bedside Disposition Plan: home when medically stable; [facility]   Consultants:  Cardiology (heart failure service)   Procedures: ECHO: pending  Antibiotics: None   HPI/Subjective: Afebrile, denies CP, still with markedly fluid overload state and positive orthopnea  Objective: Filed Vitals:   06/11/13 1507 06/11/13 1807 06/11/13 1811 06/11/13 2042  BP: 115/76 122/53  115/74  Pulse: 81 82  80  Temp: 97.6 F (36.4 C)   98.1 F (36.7 C)  TempSrc: Oral   Oral  Resp: 18   16  Height:      Weight:  SpO2: 99%  98% 97%    Exam:  General: Alert, awake, oriented x3,fluid overload and with positive orthopnea  HEENT: No bruits, no goiter. Positive JVD Heart: Regular rate and rhythm, without murmurs, rubs, gallops.  Lungs: good air movement, decrease BS at bases, no wheezing  Abdomen: Soft, nontender, nondistended, positive bowel  sounds.  Neuro: Grossly intact, nonfocal.   Data Reviewed: Basic Metabolic Panel:  Recent Labs Lab 06/10/13 1740  NA 142  K 3.6*  CL 103  CO2 25  GLUCOSE 91  BUN 21  CREATININE 2.11*  CALCIUM 9.0   Liver Function Tests:  Recent Labs Lab 06/10/13 1740  AST 25  ALT 21  ALKPHOS 119*  BILITOT 2.6*  PROT 7.0  ALBUMIN 3.3*   CBC:  Recent Labs Lab 06/10/13 1740  WBC 4.9  HGB 9.9*  HCT 33.8*  MCV 68.3*  PLT 213   BNP (last 3 results)  Recent Labs  12/31/12 0448 01/04/13 0500 06/10/13 1740  PROBNP 1543.0* 884.6* 3962.0*    Studies: Dg Chest 2 View  06/10/2013   CLINICAL DATA:  Shortness of breath and cough  EXAM: CHEST  2 VIEW  COMPARISON:  12/26/2012  FINDINGS: Cardiac shadow remains enlarged. The lungs are well aerated bilaterally. Patchy changes are again seen in the right lung base likely related to scarring given their chronicity. No new focal infiltrate is seen. No acute bony abnormality is noted.  IMPRESSION: Chronic changes in the right base likely related to scarring given the chronicity.   Electronically Signed   By: Alcide Clever M.D.   On: 06/10/2013 18:52    Scheduled Meds: . amLODipine  10 mg Oral Daily  . atorvastatin  40 mg Oral q1800  . carvedilol  25 mg Oral BID WC  . clopidogrel  75 mg Oral Q breakfast  . heparin  5,000 Units Subcutaneous 3 times per day  . hydrALAZINE  75 mg Oral 3 times per day  . isosorbide mononitrate  60 mg Oral Daily  . metolazone  2.5 mg Oral Daily  . multivitamin with minerals  1 tablet Oral Daily  . pantoprazole  40 mg Oral Daily  . potassium chloride SA  40 mEq Oral BID  . sodium chloride  3 mL Intravenous Q12H  . spironolactone  12.5 mg Oral Daily   Continuous Infusions: . furosemide (LASIX) infusion 15 mg/hr (06/11/13 1401)   Time > 30 minutes  Lynlee Stratton  [facility] Pager [phone]. If 8PM-8AM, please contact night-coverage at [email or web address], password [email or web address] 06/11/2013, 9:39 PM  LOS: 1 day

## 2013-06-11 NOTE — Progress Notes (Signed)
Utilization review completed.  

## 2013-06-12 DIAGNOSIS — E1165 Type 2 diabetes mellitus with hyperglycemia: Secondary | ICD-10-CM

## 2013-06-12 DIAGNOSIS — IMO0002 Reserved for concepts with insufficient information to code with codable children: Secondary | ICD-10-CM

## 2013-06-12 DIAGNOSIS — E118 Type 2 diabetes mellitus with unspecified complications: Secondary | ICD-10-CM

## 2013-06-12 DIAGNOSIS — N183 Chronic kidney disease, stage 3 unspecified: Secondary | ICD-10-CM

## 2013-06-12 LAB — BASIC METABOLIC PANEL
BUN: 24 mg/dL — AB (ref 6–23)
CO2: 30 meq/L (ref 19–32)
Calcium: 8.6 mg/dL (ref 8.4–10.5)
Chloride: 99 mEq/L (ref 96–112)
Creatinine, Ser: 2.19 mg/dL — ABNORMAL HIGH (ref 0.50–1.35)
GFR calc Af Amer: 40 mL/min — ABNORMAL LOW (ref >90)
GFR, EST NON AFRICAN AMERICAN: 34 mL/min — AB (ref >90)
Glucose, Bld: 104 mg/dL — ABNORMAL HIGH (ref 70–99)
Potassium: 3.3 mEq/L — ABNORMAL LOW (ref 3.7–5.3)
Sodium: 141 mEq/L (ref 137–147)

## 2013-06-12 LAB — GLUCOSE, CAPILLARY
Glucose-Capillary: 102 mg/dL — ABNORMAL HIGH (ref 70–99)
Glucose-Capillary: 108 mg/dL — ABNORMAL HIGH (ref 70–99)
Glucose-Capillary: 115 mg/dL — ABNORMAL HIGH (ref 70–99)
Glucose-Capillary: 141 mg/dL — ABNORMAL HIGH (ref 70–99)

## 2013-06-12 LAB — MAGNESIUM: Magnesium: 1.8 mg/dL (ref 1.5–2.5)

## 2013-06-12 LAB — LIPID PANEL
Cholesterol: 101 mg/dL (ref 0–200)
HDL: 36 mg/dL — ABNORMAL LOW
LDL Cholesterol: 52 mg/dL (ref 0–99)
Total CHOL/HDL Ratio: 2.8 ratio
Triglycerides: 65 mg/dL
VLDL: 13 mg/dL (ref 0–40)

## 2013-06-12 LAB — HEMOGLOBIN A1C
HEMOGLOBIN A1C: 6.6 % — AB (ref <5.7)
MEAN PLASMA GLUCOSE: 143 mg/dL — AB (ref <117)

## 2013-06-12 MED ORDER — POTASSIUM CHLORIDE CRYS ER 20 MEQ PO TBCR
40.0000 meq | EXTENDED_RELEASE_TABLET | Freq: Three times a day (TID) | ORAL | Status: DC
  Administered 2013-06-12 – 2013-06-15 (×10): 40 meq via ORAL
  Filled 2013-06-12 (×10): qty 2

## 2013-06-12 MED ORDER — MAGNESIUM SULFATE 40 MG/ML IJ SOLN
2.0000 g | Freq: Once | INTRAMUSCULAR | Status: AC
  Administered 2013-06-12: 2 g via INTRAVENOUS
  Filled 2013-06-12: qty 50

## 2013-06-12 MED ORDER — POTASSIUM CHLORIDE CRYS ER 20 MEQ PO TBCR: 40.0000 meq | EXTENDED_RELEASE_TABLET | Freq: Two times a day (BID) | ORAL | Status: DC

## 2013-06-12 NOTE — Progress Notes (Signed)
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 Admitted with recurrent HF.   Cardiac cath on 01/21/11. Totally normal codominant system.  Echo 9/14 EF 30-35% RV mildly HK   Has been following closely with Dr. Herbie Baltimore in clinic and typically does very well with sliding scale diuretics. Reports that he weighs daily with baseline weight of 242. From previous discharges baseline weight appears to be about 250. Says he takes extra lasix a couple times a month for volume overload but recently ran out of lasix and couldn't get a refill because he wasn't at 30-day mark yet. Weight continued to rise and developed progressive DOE to the point where he couldn't walk more than a few feet without DOE. +edema, ab bloating.   Continues on lasix gtt 15 mg/hr and metolazone. Weight down 17 lbs and 24 hr I/O -4.7 liters. Denies SOB, orthopnea, of CP. SBP 90-130s  Objective:   Weight Range:  Vital Signs:   Temp:  [97.6 F (36.4 C)-98.3 F (36.8 C)] 97.8 F (36.6 C) (02/18 0652) Pulse Rate:  [76-84] 84 (02/18 0652) Resp:  [16-18] 18 (02/18 0652) BP: (95-140)/(53-100) 129/91 mmHg (02/18 0652) SpO2:  [97 %-99 %] 98 % (02/18 0652) Weight:  [269 lb 3.2 oz (122.108 kg)] 269 lb 3.2 oz (122.108 kg) (02/18 0652) Last BM Date: 06/10/13  Weight change: Filed Weights   06/10/13 1754 06/11/13 0112 06/12/13 0652  Weight: 260 lb (117.935 kg) 286 lb 6 oz (129.9 kg) 269 lb 3.2 oz (122.108 kg)    Intake/Output:   Intake/Output Summary (Last 24 hours) at 06/12/13 0953 Last data filed at 06/12/13 0853  Gross per 24 hour  Intake 1637.17 ml  Output   6505 ml  Net -4867.83 ml     Physical Exam: General: Well appearing. No respiratory difficulty  HEENT: normal  Neck: supple. JVP  jaw. Carotids 2+ bilat; no bruits. No lymphadenopathy or thryomegaly appreciated.  Cor: PMI nonpalpable. Distant HS. Regular rate & rhythm. No rubs, gallops or murmurs.  Lungs: clear  Abdomen: soft, nontender, distended. No hepatosplenomegaly. No bruits or masses. Good bowel sounds.  Extremities: no cyanosis, clubbing, rash, 3+ edema  Neuro: alert & oriented x 3, cranial nerves grossly intact. moves all 4 extremities w/o difficulty. Affect pleasant.   Telemetry:   Labs: Basic Metabolic Panel:  Recent Labs Lab 06/10/13 1740 06/12/13 0357  NA 142 141  K 3.6* 3.3*  CL 103 99  CO2 25 30  GLUCOSE 91 104*  BUN 21 24*  CREATININE 2.11* 2.19*  CALCIUM 9.0 8.6    Liver Function Tests:  Recent Labs Lab 06/10/13 1740  AST 25  ALT 21  ALKPHOS 119*  BILITOT 2.6*  PROT 7.0  ALBUMIN 3.3*   No results found for this basename: LIPASE, AMYLASE,  in the last 168 hours No results found for this basename: AMMONIA,  in the last 168 hours  CBC:  Recent Labs Lab 06/10/13 1740  WBC 4.9  HGB 9.9*  HCT 33.8*  MCV 68.3*  PLT 213    Cardiac Enzymes: No results found for this basename: CKTOTAL, CKMB, CKMBINDEX, TROPONINI,  in the last 168 hours  BNP: BNP (last 3 results)  Recent Labs  12/31/12 0448 01/04/13 0500 06/10/13 1740  PROBNP 1543.0* 884.6* 3962.0*  Imaging: Dg Chest 2 View  06/10/2013   CLINICAL DATA:  Shortness of breath and cough  EXAM: CHEST  2 VIEW  COMPARISON:  12/26/2012  FINDINGS: Cardiac shadow remains enlarged. The lungs are well aerated bilaterally. Patchy changes are again seen in the right lung base likely related to scarring given their chronicity. No new focal infiltrate is seen. No acute bony abnormality is noted.  IMPRESSION: Chronic changes in the right base likely related to scarring given the chronicity.   Electronically Signed   By: Alcide CleverMark  Lukens M.D.   On: 06/10/2013 18:52      Medications:     Scheduled Medications: . amLODipine  10  mg Oral Daily  . atorvastatin  40 mg Oral q1800  . carvedilol  25 mg Oral BID WC  . clopidogrel  75 mg Oral Q breakfast  . heparin  5,000 Units Subcutaneous 3 times per day  . hydrALAZINE  75 mg Oral 3 times per day  . insulin aspart  0-15 Units Subcutaneous TID WC  . insulin aspart  0-5 Units Subcutaneous QHS  . isosorbide mononitrate  60 mg Oral Daily  . metolazone  2.5 mg Oral Daily  . multivitamin with minerals  1 tablet Oral Daily  . pantoprazole  40 mg Oral Daily  . potassium chloride SA  40 mEq Oral BID  . sodium chloride  3 mL Intravenous Q12H  . spironolactone  12.5 mg Oral Daily     Infusions: . furosemide (LASIX) infusion 15 mg/hr (06/11/13 2235)     PRN Medications:  acetaminophen, acetaminophen   Assessment:   1. A/c systolic HF -  2. Chronic renal failure , stage III  3. NICM (likely hypertensive) EF 25-30% (12/2012) 4. Morbid obesity  5. DM2 with previous HgBA1c 6.5 (he says he has never been told he is a diabetic)  6. HTN with significant proteinuria.  7. CVA   Plan/Discussion:    Brisk diuresis, down 17 lbs overnight. Cr stable. Still appears volume overloaded will continue lasix gtt and metolazone. BMET tomorrow am.   Patient reports he can afford 4$ medications, but has difficulty affording the others. Will help with meds through HF program and will have him meet with our SW on the outpatient side.    Length of Stay: 2   Aundria RudCosgrove, Ali B 06/12/2013, 9:53 AM  Advanced Heart Failure Team Pager [phone] (M-F; 7a - 4p)  Please contact [facility] for night-coverage after hours (4p -7a ) and weekends on [email or web address]  Patient seen and examined with Ulla PotashAli Cosgrove, NP. We discussed all aspects of the encounter. I agree with the assessment and plan as stated above.   Diuresing well. Renal function stable. Still markedly volume overloaded. Will continue lasix gtt. Supp electrolytes.   HgBA1c is slightly up. May need sulfonyurea at some point.     Daniel Bensimhon,MD 2:14 PM

## 2013-06-12 NOTE — Progress Notes (Signed)
[facility] PROGRESS NOTE Interim History: 48 year old obese meal with history of nonischemic cardiomyopathy NYHA class II (EF of 30-35% from echo in September 2014), uncontrolled hypertension, chronic kidney disease stage III, obstructive sleep apnea, nonobstructive CAD as per cardiac cath in 2012 , noncompliance with medications due to issues with medication affordability presented to the ED today with progressive shortness of breath for past 2 days. Patient at baseline has NYHA class II dyspnea but for past 2 days he has been short of breath on minimal exertion and even at rest since this morning. He denies any orthopnea or PND. He denies any recent illness or sick contacts. For past 2 weeks he has not taken any of these medications except for occasional Lasix as he has undergone out of all his medications. For past 2 weeks he has also noticed increased swelling of his legs and abdominal girth. He reports that his dry weight was 240 pounds in October and today it is 260 pounds..     Assessment/Plan: Acute on chronic systolic heart failure.  - Most likely due to medication compliance and diet indiscretion.  - Continue daily weight, strict intake and output, low sodium diet  - Currently on lasix drip  - Once closer to dry weight and if renal failure stable will need ACE and or ARB  - Continue b-blocker  - 2D echo 9.12.2015:ejection fraction was 30%, in the range of 25% to 30%. Diffuse hypokinesis. No evidence for LV thrombus.  CVA (cerebral infarction)- symptoms 01/01/13- scattered sm. embolic infarcts  - no new neurologic deficit  - continue plavix for secondary prevention   CKD (chronic kidney disease), stage III: - Due to HTN and diabetes.  - close follow up to renal function and electrolytes  - continue diuresis    Accelerated essential hypertension:  - due to lack of compliance with meds given difficulty to afford them.  - will resume meds and slowly titrate them up for better  control  - Bp has improved with fluid removal.  OSA (obstructive sleep apnea) -  -continue CPAP QHS   HLD:  - continue statins   Morbidly obesity:  - low calorie diet and exercise discussed with patient.   GERD:  - Continue PPI   Diabetes:  - last A1C was 6.5; currently not on meds  - great control.   Code Status: full code  Family Communication: None at bedside  Disposition Plan: Inpatient    Consultants:  Heart failure  Procedures:  none  Antibiotics:  None  HPI/Subjective: Feels better  Objective: Filed Vitals:   06/11/13 2042 06/12/13 0119 06/12/13 0652 06/12/13 0900  BP: 115/74 95/57 129/91 131/76  Pulse: 80 76 84 62  Temp: 98.1 F (36.7 C) 98.3 F (36.8 C) 97.8 F (36.6 C) 97.7 F (36.5 C)  TempSrc: Oral Oral Oral Oral  Resp: 16 18 18 20   Height:      Weight:   122.108 kg (269 lb 3.2 oz)   SpO2: 97% 97% 98% 94%    Intake/Output Summary (Last 24 hours) at 06/12/13 1041 Last data filed at 06/12/13 1000  Gross per 24 hour  Intake 1637.17 ml  Output   7905 ml  Net -6267.83 ml   Filed Weights   06/10/13 1754 06/11/13 0112 06/12/13 0652  Weight: 117.935 kg (260 lb) 129.9 kg (286 lb 6 oz) 122.108 kg (269 lb 3.2 oz)    Exam:  General: Alert, awake, oriented x3, in no acute distress.  HEENT: No bruits, no goiter. +  JVD Heart: Regular rate and rhythm, without murmurs, rubs, gallops.  Lungs: Good air movement, clear to auscultation Abdomen: Soft, nontender, nondistended, positive bowel sounds.   Data Reviewed: Basic Metabolic Panel:  Recent Labs Lab 06/10/13 1740 06/12/13 0357 06/12/13 0930  NA 142 141  --   K 3.6* 3.3*  --   CL 103 99  --   CO2 25 30  --   GLUCOSE 91 104*  --   BUN 21 24*  --   CREATININE 2.11* 2.19*  --   CALCIUM 9.0 8.6  --   MG  --   --  1.8   Liver Function Tests:  Recent Labs Lab 06/10/13 1740  AST 25  ALT 21  ALKPHOS 119*  BILITOT 2.6*  PROT 7.0  ALBUMIN 3.3*   No results found for this  basename: LIPASE, AMYLASE,  in the last 168 hours No results found for this basename: AMMONIA,  in the last 168 hours CBC:  Recent Labs Lab 06/10/13 1740  WBC 4.9  HGB 9.9*  HCT 33.8*  MCV 68.3*  PLT 213   Cardiac Enzymes: No results found for this basename: CKTOTAL, CKMB, CKMBINDEX, TROPONINI,  in the last 168 hours BNP (last 3 results)  Recent Labs  12/31/12 0448 01/04/13 0500 06/10/13 1740  PROBNP 1543.0* 884.6* 3962.0*   CBG:  Recent Labs Lab 06/11/13 2224 06/12/13 0654  GLUCAP 117* 102*    No results found for this or any previous visit (from the past 240 hour(s)).   Studies: Dg Chest 2 View  06/10/2013   CLINICAL DATA:  Shortness of breath and cough  EXAM: CHEST  2 VIEW  COMPARISON:  12/26/2012  FINDINGS: Cardiac shadow remains enlarged. The lungs are well aerated bilaterally. Patchy changes are again seen in the right lung base likely related to scarring given their chronicity. No new focal infiltrate is seen. No acute bony abnormality is noted.  IMPRESSION: Chronic changes in the right base likely related to scarring given the chronicity.   Electronically Signed   By: Alcide CleverMark  Lukens M.D.   On: 06/10/2013 18:52    Scheduled Meds: . amLODipine  10 mg Oral Daily  . atorvastatin  40 mg Oral q1800  . carvedilol  25 mg Oral BID WC  . clopidogrel  75 mg Oral Q breakfast  . heparin  5,000 Units Subcutaneous 3 times per day  . hydrALAZINE  75 mg Oral 3 times per day  . insulin aspart  0-15 Units Subcutaneous TID WC  . insulin aspart  0-5 Units Subcutaneous QHS  . isosorbide mononitrate  60 mg Oral Daily  . metolazone  2.5 mg Oral Daily  . multivitamin with minerals  1 tablet Oral Daily  . pantoprazole  40 mg Oral Daily  . potassium chloride SA  40 mEq Oral BID  . sodium chloride  3 mL Intravenous Q12H  . spironolactone  12.5 mg Oral Daily   Continuous Infusions: . furosemide (LASIX) infusion 15 mg/hr (06/11/13 2235)     Marinda ElkFELIZ ORTIZ, Dohn Stclair  [facility] Pager [phone].  If 8PM-8AM, please contact night-coverage at [email or web address], password [email or web address] 06/12/2013, 10:41 AM  LOS: 2 days

## 2013-06-12 NOTE — Progress Notes (Signed)
The patient is not ready to use the CPAP machine at this time. Patient says he does not need any help with the machine and will use the machine when he is ready to go to bed. Patient is aware to call if he needs any help at all with the machine. RT will continue to assist as needed.

## 2013-06-12 NOTE — Progress Notes (Addendum)
Pt had 10 beats of vtach.  Pt asymptomatic at the time.  Pt denies any palpitations, chest pain, lightheadedness, or dizziness.  MD has been made aware.  No new orders at this time. Nino Glow RN

## 2013-06-12 NOTE — Progress Notes (Signed)
BP 109/67, HR 89.  Scheduled dose of Hydralazine not given.  MD has been made aware.  Ordered to continue to hold med at this time. Nino Glow RN

## 2013-06-13 DIAGNOSIS — N058 Unspecified nephritic syndrome with other morphologic changes: Secondary | ICD-10-CM

## 2013-06-13 DIAGNOSIS — E1129 Type 2 diabetes mellitus with other diabetic kidney complication: Secondary | ICD-10-CM

## 2013-06-13 DIAGNOSIS — I119 Hypertensive heart disease without heart failure: Secondary | ICD-10-CM

## 2013-06-13 DIAGNOSIS — E1121 Type 2 diabetes mellitus with diabetic nephropathy: Secondary | ICD-10-CM | POA: Diagnosis present

## 2013-06-13 LAB — BASIC METABOLIC PANEL
BUN: 26 mg/dL — AB (ref 6–23)
CO2: 30 meq/L (ref 19–32)
Calcium: 9.4 mg/dL (ref 8.4–10.5)
Chloride: 94 mEq/L — ABNORMAL LOW (ref 96–112)
Creatinine, Ser: 2.25 mg/dL — ABNORMAL HIGH (ref 0.50–1.35)
GFR calc Af Amer: 38 mL/min — ABNORMAL LOW (ref >90)
GFR, EST NON AFRICAN AMERICAN: 33 mL/min — AB (ref >90)
GLUCOSE: 115 mg/dL — AB (ref 70–99)
POTASSIUM: 3.5 meq/L — AB (ref 3.7–5.3)
Sodium: 142 mEq/L (ref 137–147)

## 2013-06-13 LAB — GLUCOSE, CAPILLARY
GLUCOSE-CAPILLARY: 112 mg/dL — AB (ref 70–99)
GLUCOSE-CAPILLARY: 118 mg/dL — AB (ref 70–99)
GLUCOSE-CAPILLARY: 138 mg/dL — AB (ref 70–99)
Glucose-Capillary: 122 mg/dL — ABNORMAL HIGH (ref 70–99)

## 2013-06-13 MED ORDER — INSULIN DETEMIR 100 UNIT/ML ~~LOC~~ SOLN
5.0000 [IU] | Freq: Every day | SUBCUTANEOUS | Status: DC
  Administered 2013-06-13 – 2013-06-15 (×3): 5 [IU] via SUBCUTANEOUS
  Filled 2013-06-13 (×4): qty 0.05

## 2013-06-13 MED ORDER — POTASSIUM CHLORIDE CRYS ER 20 MEQ PO TBCR
20.0000 meq | EXTENDED_RELEASE_TABLET | Freq: Once | ORAL | Status: AC
  Administered 2013-06-13: 20 meq via ORAL
  Filled 2013-06-13: qty 1

## 2013-06-13 NOTE — Progress Notes (Signed)
[facility] PROGRESS NOTE Interim History: 48 year old obese meal with history of nonischemic cardiomyopathy NYHA class II (EF of 30-35% from echo in September 2014), uncontrolled hypertension, chronic kidney disease stage III, obstructive sleep apnea, nonobstructive CAD as per cardiac cath in 2012 , noncompliance with medications due to issues with medication affordability presented to the ED today with progressive shortness of breath for past 2 days. Patient at baseline has NYHA class II dyspnea but for past 2 days he has been short of breath on minimal exertion and even at rest since this morning. He denies any orthopnea or PND. He denies any recent illness or sick contacts. For past 2 weeks he has not taken any of these medications except for occasional Lasix as he has undergone out of all his medications. For past 2 weeks he has also noticed increased swelling of his legs and abdominal girth. He reports that his dry weight was 240 pounds in October and today it is 260 pounds..     Assessment/Plan: Acute on chronic systolic heart failure.  - Most likely due to medication compliance and diet indiscretion.  - appreciate advance heart failure team assistance. - Currently on lasix drip, with good UOP. about 13.5L negative. Cr remains stable. - Once closer to dry weight and if renal failure stable will need ACE and or ARB  - Continue b-blocker weight 129->122.0->114 kg. - 2D echo 9.12.2015:ejection fraction was 30%, in the range of 25% to 30%. Diffuse hypokinesis. No evidence for LV thrombus. -- Continue daily weight, strict intake and output, low sodium diet   CVA (cerebral infarction)- symptoms 01/01/13- scattered sm. embolic infarcts  - no new neurologic deficit  - continue plavix for secondary prevention   CKD (chronic kidney disease), stage III/Diabetic nephropathy: - Due to HTN and diabetes.  - close follow up to renal function and electrolytes  - continue diuresis    Accelerated  essential hypertension/Hypertensive heart disease:  - due to lack of compliance with meds given difficulty to afford them.  - will resume meds and slowly titrate them up for better control  - Bp has improved with fluid removal.  OSA (obstructive sleep apnea) -  -continue CPAP QHS   HLD:  - continue statins   Morbidly obesity:  - low calorie diet and exercise discussed with patient.   GERD:  - Continue PPI   Diabetes:  - last A1C was 6.5 on 2013 6.0 will add low dose lantus. - great control in house.   Code Status: full code  Family Communication: None at bedside  Disposition Plan: Inpatient    Consultants:  Heart failure team  Procedures:  none  Antibiotics:  None  HPI/Subjective: Feels better  Objective: Filed Vitals:   06/12/13 1530 06/12/13 1815 06/12/13 2123 06/13/13 0619  BP: 109/67 138/67 116/73 118/72  Pulse: 89 80 78 73  Temp:   98.4 F (36.9 C) 97.7 F (36.5 C)  TempSrc:   Oral Oral  Resp:   18 19  Height:      Weight:    114 kg (251 lb 5.2 oz)  SpO2:   98% 95%    Intake/Output Summary (Last 24 hours) at 06/13/13 0748 Last data filed at 06/13/13 0735  Gross per 24 hour  Intake   1635 ml  Output   8823 ml  Net  -7188 ml   Filed Weights   06/11/13 0112 06/12/13 0652 06/13/13 0619  Weight: 129.9 kg (286 lb 6 oz) 122.108 kg (269 lb 3.2 oz) 114  kg (251 lb 5.2 oz)    Exam:  General: Alert, awake, oriented x3, in no acute distress.  HEENT: No bruits, no goiter. + JVD Heart: Regular rate and rhythm, without murmurs, rubs, gallops.  Lungs: Good air movement, clear to auscultation Abdomen: Soft, nontender, nondistended, positive bowel sounds.   Data Reviewed: Basic Metabolic Panel:  Recent Labs Lab 06/10/13 1740 06/12/13 0357 06/12/13 0930 06/13/13 0450  NA 142 141  --  142  K 3.6* 3.3*  --  3.5*  CL 103 99  --  94*  CO2 25 30  --  30  GLUCOSE 91 104*  --  115*  BUN 21 24*  --  26*  CREATININE 2.11* 2.19*  --  2.25*  CALCIUM  9.0 8.6  --  9.4  MG  --   --  1.8  --    Liver Function Tests:  Recent Labs Lab 06/10/13 1740  AST 25  ALT 21  ALKPHOS 119*  BILITOT 2.6*  PROT 7.0  ALBUMIN 3.3*   No results found for this basename: LIPASE, AMYLASE,  in the last 168 hours No results found for this basename: AMMONIA,  in the last 168 hours CBC:  Recent Labs Lab 06/10/13 1740  WBC 4.9  HGB 9.9*  HCT 33.8*  MCV 68.3*  PLT 213   Cardiac Enzymes: No results found for this basename: CKTOTAL, CKMB, CKMBINDEX, TROPONINI,  in the last 168 hours BNP (last 3 results)  Recent Labs  12/31/12 0448 01/04/13 0500 06/10/13 1740  PROBNP 1543.0* 884.6* 3962.0*   CBG:  Recent Labs Lab 06/12/13 0654 06/12/13 1101 06/12/13 1621 06/12/13 2125 06/13/13 0628  GLUCAP 102* 108* 115* 141* 112*    No results found for this or any previous visit (from the past 240 hour(s)).   Studies: No results found.  Scheduled Meds: . amLODipine  10 mg Oral Daily  . atorvastatin  40 mg Oral q1800  . carvedilol  25 mg Oral BID WC  . clopidogrel  75 mg Oral Q breakfast  . heparin  5,000 Units Subcutaneous 3 times per day  . hydrALAZINE  75 mg Oral 3 times per day  . insulin aspart  0-15 Units Subcutaneous TID WC  . insulin aspart  0-5 Units Subcutaneous QHS  . isosorbide mononitrate  60 mg Oral Daily  . metolazone  2.5 mg Oral Daily  . multivitamin with minerals  1 tablet Oral Daily  . pantoprazole  40 mg Oral Daily  . potassium chloride SA  40 mEq Oral TID  . sodium chloride  3 mL Intravenous Q12H  . spironolactone  12.5 mg Oral Daily   Continuous Infusions: . furosemide (LASIX) infusion 15 mg/hr (06/12/13 1930)     Marinda ElkFELIZ ORTIZ, Pj Zehner  [facility] Pager [phone].  If 8PM-8AM, please contact night-coverage at [email or web address], password [email or web address] 06/13/2013, 7:48 AM  LOS: 3 days

## 2013-06-13 NOTE — Progress Notes (Signed)
CARDIAC REHAB PHASE I   PRE:  Rate/Rhythm: 73 SR  BP:  Supine: 97/56  Sitting:   Standing:    SaO2: 90%RA  MODE:  Ambulation: 700 ft   POST:  Rate/Rhythm: 82  BP:  Supine:   Sitting: 98/81  Standing:    SaO2: 92%RA 1430-1510 Pt walked 700 ft with steady gait. C/o lightheadedness that got a little better with walking. Slightly SOB per pt but so much better than earlier in week. No DOE noted. Tolerated well.    Luetta Nutting, RN BSN  06/13/2013 3:07 PM

## 2013-06-13 NOTE — Progress Notes (Signed)
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 Admitted with recurrent HF.   Cardiac cath on 01/21/11. Totally normal codominant system.  Echo 9/14 EF 30-35% RV mildly HK   Has been following closely with Dr. Herbie Baltimore in clinic and typically does very well with sliding scale diuretics. Reports that he weighs daily with baseline weight of 242. From previous discharges baseline weight appears to be about 250. Says he takes extra lasix a couple times a month for volume overload but recently ran out of lasix and couldn't get a refill because he wasn't at 30-day mark yet. Weight continued to rise and developed progressive DOE to the point where he couldn't walk more than a few feet without DOE. +edema, ab bloating.   Continues on lasix gtt 15 mg/hr and metolazone. Weight down 18lbs (total 35 lbs) and 24 hr I/O -6.5 liters. Denies SOB, orthopnea, of CP. SBP 100-115s  Objective:   Weight Range:  Vital Signs:   Temp:  [97.7 F (36.5 C)-98.4 F (36.9 C)] 97.7 F (36.5 C) (02/19 0619) Pulse Rate:  [73-89] 73 (02/19 0619) Resp:  [18-20] 19 (02/19 0619) BP: (98-138)/(56-73) 118/72 mmHg (02/19 0619) SpO2:  [94 %-98 %] 95 % (02/19 0619) Weight:  [251 lb 5.2 oz (114 kg)] 251 lb 5.2 oz (114 kg) (02/19 0619) Last BM Date: 06/10/13  Weight change: Filed Weights   06/11/13 0112 06/12/13 0652 06/13/13 0619  Weight: 286 lb 6 oz (129.9 kg) 269 lb 3.2 oz (122.108 kg) 251 lb 5.2 oz (114 kg)    Intake/Output:   Intake/Output Summary (Last 24 hours) at 06/13/13 0929 Last data filed at 06/13/13 0815  Gross per 24 hour  Intake   1635 ml  Output   8323 ml  Net  -6688 ml     Physical Exam: General: Well appearing. No respiratory difficulty; lying in bed HEENT: normal   Neck: supple. JVP 10. Carotids 2+ bilat; no bruits. No lymphadenopathy or thryomegaly appreciated.  Cor: PMI nonpalpable. Distant HS. Regular rate & rhythm. No rubs, gallops or murmurs.  Lungs: clear  Abdomen: soft, nontender, distended. No hepatosplenomegaly. No bruits or masses. Good bowel sounds.  Extremities: no cyanosis, clubbing, rash, 2+ edema  Neuro: alert & oriented x 3, cranial nerves grossly intact. moves all 4 extremities w/o difficulty. Affect pleasant.   Telemetry: SR 80s  Labs: Basic Metabolic Panel:  Recent Labs Lab 06/10/13 1740 06/12/13 0357 06/12/13 0930 06/13/13 0450  NA 142 141  --  142  K 3.6* 3.3*  --  3.5*  CL 103 99  --  94*  CO2 25 30  --  30  GLUCOSE 91 104*  --  115*  BUN 21 24*  --  26*  CREATININE 2.11* 2.19*  --  2.25*  CALCIUM 9.0 8.6  --  9.4  MG  --   --  1.8  --     Liver Function Tests:  Recent Labs Lab 06/10/13 1740  AST 25  ALT 21  ALKPHOS 119*  BILITOT 2.6*  PROT 7.0  ALBUMIN 3.3*   No results found for this basename: LIPASE, AMYLASE,  in the last 168 hours No results found for this basename: AMMONIA,  in the last 168 hours  CBC:  Recent Labs Lab 06/10/13 1740  WBC 4.9  HGB 9.9*  HCT 33.8*  MCV 68.3*  PLT 213    Cardiac Enzymes: No results found for this basename: CKTOTAL, CKMB, CKMBINDEX, TROPONINI,  in the last 168 hours  BNP: BNP (last 3 results)  Recent Labs  12/31/12 0448 01/04/13 0500 06/10/13 1740  PROBNP 1543.0* 884.6* 3962.0*     Imaging: No results found.   Medications:     Scheduled Medications: . amLODipine  10 mg Oral Daily  . atorvastatin  40 mg Oral q1800  . carvedilol  25 mg Oral BID WC  . clopidogrel  75 mg Oral Q breakfast  . heparin  5,000 Units Subcutaneous 3 times per day  . hydrALAZINE  75 mg Oral 3 times per day  . insulin detemir  5 Units Subcutaneous Daily  . isosorbide mononitrate  60 mg Oral Daily  . metolazone  2.5 mg Oral Daily  . multivitamin with minerals  1  tablet Oral Daily  . pantoprazole  40 mg Oral Daily  . potassium chloride SA  40 mEq Oral TID  . sodium chloride  3 mL Intravenous Q12H  . spironolactone  12.5 mg Oral Daily    Infusions: . furosemide (LASIX) infusion 15 mg/hr (06/12/13 1930)    PRN Medications: acetaminophen, acetaminophen   Assessment:   1. A/c systolic HF -  2. Chronic renal failure , stage III  3. NICM (likely hypertensive) EF 25-30% (12/2012) 4. Morbid obesity  5. DM2 with previous HgBA1c 6.5 (he says he has never been told he is a diabetic)  6. HTN with significant proteinuria.  7. CVA   Plan/Discussion:    Continues to have brisk diuresis, down 18lbs overnight (total 35 lbs). Cr stable. Still is volume volume overloaded will continue lasix gtt and metolazone for one more day. Reports baseline weight about 242 lbs (currently 251 lbs). BMET tomorrow am. Supplement K+.  Ambulate in halls today with CR.   SBP soft 100-110s, will hold off on titrating medications currently with continued diuresis. Will need repeat ECHO on outpatient side to reassess EF once stable on meds in order to evaluate for ICD. QRS narrow so not CRT-D candidate.  Patient reports he can afford 4$ medications, but has difficulty affording the others. Will help with meds through HF program and will have him meet with our SW on the outpatient side.    Length of Stay: 3 Aundria RudCosgrove, Ali B 06/13/2013, 9:29 AM  Advanced Heart Failure Team Pager [phone] (M-F; 7a - 4p)  Please contact [facility] for night-coverage after hours (4p -7a ) and weekends on [email or web address]  Patient seen with NP, agree with the above note.  Patient diuresed well yesterday. Renal fxn is basically stable.  He is still volume overloaded with elevated JVP.  Would continue current diuretics today but will need to follow creatinine carefully.  Will reassess tomorrow.  No med changes.   Marca AnconaDalton Wynee Matarazzo 06/13/2013 12:16 PM

## 2013-06-13 NOTE — Progress Notes (Signed)
Pt stating he has a lump over his belly button.  Lump assessed and MD notified. MD assessed lump with RN and notified the pt that it was a hernia.  Pt states it does not hurt, will continue to monitor.

## 2013-06-13 NOTE — Progress Notes (Signed)
Patient places himself on CPAP. Patient has home machine and toleres well. Explained if any issue to just have Rn call Rt

## 2013-06-14 DIAGNOSIS — I509 Heart failure, unspecified: Secondary | ICD-10-CM

## 2013-06-14 DIAGNOSIS — N289 Disorder of kidney and ureter, unspecified: Secondary | ICD-10-CM

## 2013-06-14 DIAGNOSIS — N189 Chronic kidney disease, unspecified: Secondary | ICD-10-CM

## 2013-06-14 DIAGNOSIS — I5043 Acute on chronic combined systolic (congestive) and diastolic (congestive) heart failure: Principal | ICD-10-CM

## 2013-06-14 LAB — BASIC METABOLIC PANEL
BUN: 29 mg/dL — AB (ref 6–23)
CALCIUM: 9.2 mg/dL (ref 8.4–10.5)
CHLORIDE: 92 meq/L — AB (ref 96–112)
CO2: 34 mEq/L — ABNORMAL HIGH (ref 19–32)
Creatinine, Ser: 2.66 mg/dL — ABNORMAL HIGH (ref 0.50–1.35)
GFR calc Af Amer: 31 mL/min — ABNORMAL LOW (ref >90)
GFR calc non Af Amer: 27 mL/min — ABNORMAL LOW (ref >90)
Glucose, Bld: 102 mg/dL — ABNORMAL HIGH (ref 70–99)
Potassium: 3.7 mEq/L (ref 3.7–5.3)
Sodium: 139 mEq/L (ref 137–147)

## 2013-06-14 LAB — GLUCOSE, CAPILLARY
GLUCOSE-CAPILLARY: 101 mg/dL — AB (ref 70–99)
GLUCOSE-CAPILLARY: 113 mg/dL — AB (ref 70–99)
Glucose-Capillary: 107 mg/dL — ABNORMAL HIGH (ref 70–99)
Glucose-Capillary: 117 mg/dL — ABNORMAL HIGH (ref 70–99)

## 2013-06-14 MED ORDER — LIVING WELL WITH DIABETES BOOK
Freq: Once | Status: AC
Start: 2013-06-14 — End: 2013-06-14
  Administered 2013-06-14: 15:00:00
  Filled 2013-06-14: qty 1

## 2013-06-14 MED ORDER — SPIRONOLACTONE 12.5 MG HALF TABLET
12.5000 mg | ORAL_TABLET | Freq: Every day | ORAL | Status: DC
  Administered 2013-06-15: 12.5 mg via ORAL
  Filled 2013-06-14: qty 1

## 2013-06-14 MED ORDER — POLYETHYLENE GLYCOL 3350 17 G PO PACK
17.0000 g | PACK | Freq: Every day | ORAL | Status: DC
  Administered 2013-06-14: 17 g via ORAL
  Filled 2013-06-14 (×2): qty 1

## 2013-06-14 NOTE — Progress Notes (Signed)
I cosign with Laura B Caldwell on all assessments, notes, medication administration and documentation for this shift. Zitlali Primm A, RN  

## 2013-06-14 NOTE — Progress Notes (Signed)
I had a lengthy conversation with Devon Ortiz regarding his heart failure diagnosis.  He seems to have a great deal of previous education regarding HF and has been managing "ok" at home except for some financial challenges that have affected his ability to get medications.  He is very open to education and seems motivated to be compliant.  We spent some time reviewing HF education including signs and symptoms, when to call the physician, importance of taking medications as prescribed, low sodium diet and fluid restriction.  I will plan to visit again prior to discharge.  Driscilla Moats RN, BSN, PCCN--Heart Failure Statistician

## 2013-06-14 NOTE — Progress Notes (Signed)
[facility] PROGRESS NOTE Interim History: 48 year old obese meal with history of nonischemic cardiomyopathy NYHA class II (EF of 30-35% from echo in September 2014), uncontrolled hypertension, chronic kidney disease stage III, obstructive sleep apnea, nonobstructive CAD as per cardiac cath in 2012 , noncompliance with medications due to issues with medication affordability presented to the ED today with progressive shortness of breath for past 2 days. Patient at baseline has NYHA class II dyspnea but for past 2 days he has been short of breath on minimal exertion and even at rest since this morning. He denies any orthopnea or PND. He denies any recent illness or sick contacts. For past 2 weeks he has not taken any of these medications except for occasional Lasix as he has undergone out of all his medications. For past 2 weeks he has also noticed increased swelling of his legs and abdominal girth. He reports that his dry weight was 240 pounds in October and today it is 260 pounds..     Assessment/Plan: Acute on chronic systolic heart failure: - Most likely due to medication compliance and diet indiscretion.  - Appreciate advance heart failure team assistance. - Started on lasix drip once Euvolemic change to oral, with good UOP. about 17.5L negative. Mild increase in Cr. - Start ACE as an outpatient. - Continue beta-blocker weight 129->122.0->114 ->100 kg. - 2D echo 9.12.2015:ejection fraction was 30%, in the range of 25% to 30%. Diffuse hypokinesis. No evidence for LV thrombus. - Follow up at the heart failure clinic.  CVA (cerebral infarction)- symptoms 01/01/13- scattered sm. embolic infarcts: - No new neurologic deficit  - Continue plavix for secondary prevention. - Follow up with neurology as an outpatient.  CKD (chronic kidney disease), stage III/Diabetic nephropathy: - Due to HTN and diabetes.  - close follow up to renal function and electrolytes  - continue diuresis     Accelerated essential hypertension/Hypertensive heart disease:  - Due to lack of compliance with meds given difficulty to afford them.  - will resume meds and slowly titrate them up for better control  - Bp has improved with fluid removal. HF clinic will help with medications.  OSA (obstructive sleep apnea)  -continue CPAP QHS   HLD:  - continue statins   Morbidly obesity:  - low calorie diet and exercise discussed with patient.   GERD:  - Continue PPI   Diabetes Mellitus II:  - Last A1C was 6.5 on 2013 6.0 will add low dose lantus. - Great control in house.   Code Status: full code  Family Communication: None at bedside  Disposition Plan: Inpatient    Consultants:  Heart failure team  Procedures:  none  Antibiotics:  None  HPI/Subjective: Feels better  Objective: Filed Vitals:   06/13/13 2100 06/14/13 0127 06/14/13 0139 06/14/13 0534  BP: 110/69 87/62 98/68  105/68  Pulse: 90 80  73  Temp: 97.8 F (36.6 C) 98.3 F (36.8 C)  97.6 F (36.4 C)  TempSrc: Oral Oral  Oral  Resp: 18 18  20   Height:      Weight:    110.2 kg (242 lb 15.2 oz)  SpO2: 96% 98%  100%    Intake/Output Summary (Last 24 hours) at 06/14/13 1057 Last data filed at 06/14/13 0809  Gross per 24 hour  Intake   1080 ml  Output   5225 ml  Net  -4145 ml   Filed Weights   06/12/13 0652 06/13/13 0619 06/14/13 0534  Weight: 122.108 kg (269 lb 3.2 oz) 114  kg (251 lb 5.2 oz) 110.2 kg (242 lb 15.2 oz)    Exam:  General: Alert, awake, oriented x3, in no acute distress.  HEENT: No bruits, no goiter. - JVD Heart: Regular rate and rhythm, without murmurs, rubs, gallops.  Lungs: Good air movement, clear to auscultation Abdomen: Soft, nontender, nondistended, positive bowel sounds.   Data Reviewed: Basic Metabolic Panel:  Recent Labs Lab 06/10/13 1740 06/12/13 0357 06/12/13 0930 06/13/13 0450 06/14/13 0438  NA 142 141  --  142 139  K 3.6* 3.3*  --  3.5* 3.7  CL 103 99  --  94*  92*  CO2 25 30  --  30 34*  GLUCOSE 91 104*  --  115* 102*  BUN 21 24*  --  26* 29*  CREATININE 2.11* 2.19*  --  2.25* 2.66*  CALCIUM 9.0 8.6  --  9.4 9.2  MG  --   --  1.8  --   --    Liver Function Tests:  Recent Labs Lab 06/10/13 1740  AST 25  ALT 21  ALKPHOS 119*  BILITOT 2.6*  PROT 7.0  ALBUMIN 3.3*   No results found for this basename: LIPASE, AMYLASE,  in the last 168 hours No results found for this basename: AMMONIA,  in the last 168 hours CBC:  Recent Labs Lab 06/10/13 1740  WBC 4.9  HGB 9.9*  HCT 33.8*  MCV 68.3*  PLT 213   Cardiac Enzymes: No results found for this basename: CKTOTAL, CKMB, CKMBINDEX, TROPONINI,  in the last 168 hours BNP (last 3 results)  Recent Labs  12/31/12 0448 01/04/13 0500 06/10/13 1740  PROBNP 1543.0* 884.6* 3962.0*   CBG:  Recent Labs Lab 06/13/13 0628 06/13/13 1117 06/13/13 1621 06/13/13 2002 06/14/13 0548  GLUCAP 112* 118* 138* 122* 107*    No results found for this or any previous visit (from the past 240 hour(s)).   Studies: No results found.  Scheduled Meds: . amLODipine  10 mg Oral Daily  . atorvastatin  40 mg Oral q1800  . carvedilol  25 mg Oral BID WC  . clopidogrel  75 mg Oral Q breakfast  . heparin  5,000 Units Subcutaneous 3 times per day  . hydrALAZINE  75 mg Oral 3 times per day  . insulin detemir  5 Units Subcutaneous Daily  . isosorbide mononitrate  60 mg Oral Daily  . multivitamin with minerals  1 tablet Oral Daily  . pantoprazole  40 mg Oral Daily  . polyethylene glycol  17 g Oral Daily  . potassium chloride SA  40 mEq Oral TID  . sodium chloride  3 mL Intravenous Q12H  . [START ON 06/15/2013] spironolactone  12.5 mg Oral Daily   Continuous Infusions:     Marinda Elk  [facility] Pager [phone].  If 8PM-8AM, please contact night-coverage at [email or web address], password [facility] 06/14/2013, 10:57 AM  LOS: 4 days

## 2013-06-14 NOTE — Progress Notes (Signed)
Pt c/o of dizziness, bp 102/78 manually and cbg 101.  MD notified and ordered orthostatics.  Will carry out MD orders and continue to monitor.

## 2013-06-14 NOTE — Progress Notes (Signed)
CARDIAC REHAB PHASE I   PRE:  Rate/Rhythm: 75 SR    BP: sitting 100/70    SaO2: 94 RA  MODE:  Ambulation: 760 ft   POST:  Rate/Rhythm: 79 SR    BP: sitting 92/76     SaO2: 98 RA  Tolerated well. C/o lightheadedness upon standing, resolved with distance/time. Discussed daily wts, low sodium, ex, and DM. Gave handouts and video to watch. Requests a DM outpt ed class.  [phone]   Elissa Lovett [place] CES, ACSM 06/14/2013 1:28 PM

## 2013-06-14 NOTE — Progress Notes (Signed)
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 Admitted with recurrent HF.   Cardiac cath on 01/21/11. Totally normal codominant system.  Echo 9/14 EF 30-35% RV mildly HK   Has been following closely with Dr. Herbie Baltimore in clinic and typically does very well with sliding scale diuretics. Reports that he weighs daily with baseline weight of 242. From previous discharges baseline weight appears to be about 250. Says he takes extra lasix a couple times a month for volume overload but recently ran out of lasix and couldn't get a refill because he wasn't at 30-day mark yet. Weight continued to rise and developed progressive DOE to the point where he couldn't walk more than a few feet without DOE. +edema, ab bloating.   Continues on lasix gtt 15 mg/hr and metolazone. Weight down 9lbs (total 44 lbs) and 24 hr I/O -4.5 liters. Denies SOB, orthopnea, of CP. SBP 90-100s. Walking with CR 753ft. Cr increased from 2.25 to 2.6. +slight dizziness.  Objective:   Weight Range:  Vital Signs:   Temp:  [97.6 F (36.4 C)-98.3 F (36.8 C)] 97.6 F (36.4 C) (02/20 0534) Pulse Rate:  [73-97] 73 (02/20 0534) Resp:  [18-20] 20 (02/20 0534) BP: (87-110)/(40-69) 105/68 mmHg (02/20 0534) SpO2:  [95 %-100 %] 100 % (02/20 0534) Weight:  [242 lb 15.2 oz (110.2 kg)] 242 lb 15.2 oz (110.2 kg) (02/20 0534) Last BM Date: 06/10/13  Weight change: Filed Weights   06/12/13 0652 06/13/13 0619 06/14/13 0534  Weight: 269 lb 3.2 oz (122.108 kg) 251 lb 5.2 oz (114 kg) 242 lb 15.2 oz (110.2 kg)    Intake/Output:   Intake/Output Summary (Last 24 hours) at 06/14/13 0745 Last data filed at 06/14/13 0438  Gross per 24 hour  Intake   1200 ml  Output   5100 ml  Net  -3900 ml     Physical  Exam: General: Well appearing. No respiratory difficulty; sitting on side of bed HEENT: normal  Neck: supple. JVP 8. Carotids 2+ bilat; no bruits. No lymphadenopathy or thryomegaly appreciated.  Cor: PMI nonpalpable. Distant HS. Regular rate & rhythm. No rubs, gallops or murmurs.  Lungs: clear  Abdomen: soft, nontender, nondistended. No hepatosplenomegaly. No bruits or masses. Good bowel sounds.  Extremities: no cyanosis, clubbing, rash, trace  Neuro: alert & oriented x 3, cranial nerves grossly intact. moves all 4 extremities w/o difficulty. Affect pleasant.   Telemetry: SR 70-80s  Labs: Basic Metabolic Panel:  Recent Labs Lab 06/10/13 1740 06/12/13 0357 06/12/13 0930 06/13/13 0450 06/14/13 0438  NA 142 141  --  142 139  K 3.6* 3.3*  --  3.5* 3.7  CL 103 99  --  94* 92*  CO2 25 30  --  30 34*  GLUCOSE 91 104*  --  115* 102*  BUN 21 24*  --  26* 29*  CREATININE 2.11* 2.19*  --  2.25* 2.66*  CALCIUM 9.0 8.6  --  9.4 9.2  MG  --   --  1.8  --   --     Liver Function Tests:  Recent Labs Lab 06/10/13 1740  AST 25  ALT 21  ALKPHOS 119*  BILITOT 2.6*  PROT 7.0  ALBUMIN 3.3*   No results found for this basename: LIPASE, AMYLASE,  in the last 168 hours No results found for this basename: AMMONIA,  in the last 168 hours  CBC:  Recent Labs Lab 06/10/13 1740  WBC 4.9  HGB 9.9*  HCT 33.8*  MCV 68.3*  PLT 213    Cardiac Enzymes: No results found for this basename: CKTOTAL, CKMB, CKMBINDEX, TROPONINI,  in the last 168 hours  BNP: BNP (last 3 results)  Recent Labs  12/31/12 0448 01/04/13 0500 06/10/13 1740  PROBNP 1543.0* 884.6* 3962.0*     Imaging: No results found.   Medications:     Scheduled Medications: . amLODipine  10 mg Oral Daily  . atorvastatin  40 mg Oral q1800  . carvedilol  25 mg Oral BID WC  . clopidogrel  75 mg Oral Q breakfast  . heparin  5,000 Units Subcutaneous 3 times per day  . hydrALAZINE  75 mg Oral 3 times per day  .  insulin detemir  5 Units Subcutaneous Daily  . isosorbide mononitrate  60 mg Oral Daily  . multivitamin with minerals  1 tablet Oral Daily  . pantoprazole  40 mg Oral Daily  . potassium chloride SA  40 mEq Oral TID  . sodium chloride  3 mL Intravenous Q12H  . spironolactone  12.5 mg Oral Daily    Infusions:    PRN Medications: acetaminophen, acetaminophen   Assessment:   1. A/c systolic HF -  2. Chronic renal failure , stage III  3. NICM (likely hypertensive) EF 25-30% (12/2012) 4. Morbid obesity  5. DM2 with previous HgBA1c 6.5 (he says he has never been told he is a diabetic)  6. HTN with significant proteinuria.  7. CVA  8. Hypotenstion  Plan/Discussion:    Weight down another 9 lbs (total 44 lbs). Bump in Cr from 2.25 to 2.66, will stop IV diuretics and metolazone and transition to PO lasix tomorrow. Will transition to 80 mg q am and 40 mg qpm. Home dose was 80 mg daily, however ran out of medications for 2 weeks d/t having to take extra. Could hold Cleda DaubSpiro today?  SBP soft 90-100s with mild dizziness. Will hold off on titrating medications currently. Hoping dizziness will improve with stopping lasix.   Will need repeat ECHO on outpatient side to reassess EF once stable on meds in order to evaluate for ICD. QRS narrow so not CRT-D candidate.  Patient reports he can afford 4$ medications, but has difficulty affording the others. Will help with meds through HF program and will have him meet with our SW on the outpatient side. Will likely go home tomorrow if Cr stable. Will place appointment on chart and will provide patient with medications that HF program can assist with  Discharge Meds: Norvasc 10 mg daily (Will provide medication) Coreg 25 mg BID (send to [facility]) Plavix 75 mg daily (Will provide medication) Lasix 80 mg q am and 40 mg q pm (send to [facility]) Hydralazine 75 mg TID (Send to [facility]) IMDUR 60 mg daily (Will provide medication) KDUR 40 meq daily (Will provide  medication) Spiro 12.5 mg daily (send to [facility])   Length of Stay: 4 Aundria RudCosgrove, Ali B NP-C 06/14/2013, 7:45 AM  Advanced Heart Failure Team Pager [phone] (M-F; 7a - 4p)  Please contact [facility] for night-coverage after hours (4p -7a ) and weekends on [email or web address]  Patient seen and examined with Ulla PotashAli Cosgrove, NP. We discussed all aspects of the encounter. I agree with the assessment and plan as stated above.    Looks great. Weight  down 44 pounds. Now dry. Will hold diuretics. If renal function stable/improved tomorrow can go home on above meds. Will need close f/u in HF Clinic.   Anaissa Macfadden,MD 8:55 AM

## 2013-06-14 NOTE — Progress Notes (Addendum)
I saw Devon Ortiz again today.  He hopes to discharge tomorrow home alone.  He will be followed in AHF clinic and his follow- up appointment is scheduled 06/20/13 at 12:30.  We reviewed again the HF education including signs and symptoms of HF, when to call the physician, importance of daily weights, low sodium diet, taking all medications as prescribed and fluid restriction.  He acknowledges understanding of the all above.  He would like to have outpatient diabetes education as well.  Driscilla Moats RN, BSN, [employer]

## 2013-06-14 NOTE — Progress Notes (Signed)
Pt. States he can place cpap on himself. RT informed pt. To notify if he needs any assistance. 

## 2013-06-14 NOTE — Progress Notes (Signed)
Pt systolic BP low over night. Pt on lasix gtt. BP 105/68 this AM and 75mg  hydralazine due. MD on call Lynch notified and ordered to hold hydralazine this AM. Huel Coventry, RN

## 2013-06-15 LAB — PRO B NATRIURETIC PEPTIDE: Pro B Natriuretic peptide (BNP): 426.5 pg/mL — ABNORMAL HIGH (ref 0–125)

## 2013-06-15 LAB — BASIC METABOLIC PANEL
BUN: 37 mg/dL — ABNORMAL HIGH (ref 6–23)
CHLORIDE: 94 meq/L — AB (ref 96–112)
CO2: 31 mEq/L (ref 19–32)
Calcium: 9 mg/dL (ref 8.4–10.5)
Creatinine, Ser: 2.73 mg/dL — ABNORMAL HIGH (ref 0.50–1.35)
GFR calc Af Amer: 30 mL/min — ABNORMAL LOW (ref >90)
GFR calc non Af Amer: 26 mL/min — ABNORMAL LOW (ref >90)
GLUCOSE: 115 mg/dL — AB (ref 70–99)
Potassium: 4.2 mEq/L (ref 3.7–5.3)
SODIUM: 139 meq/L (ref 137–147)

## 2013-06-15 LAB — GLUCOSE, CAPILLARY
GLUCOSE-CAPILLARY: 110 mg/dL — AB (ref 70–99)
Glucose-Capillary: 113 mg/dL — ABNORMAL HIGH (ref 70–99)
Glucose-Capillary: 122 mg/dL — ABNORMAL HIGH (ref 70–99)

## 2013-06-15 MED ORDER — ATORVASTATIN CALCIUM 40 MG PO TABS: 40.0000 mg | ORAL_TABLET | Freq: Every day | ORAL | Status: DC

## 2013-06-15 MED ORDER — HYDRALAZINE HCL 25 MG PO TABS: 75.0000 mg | ORAL_TABLET | Freq: Three times a day (TID) | ORAL | Status: DC

## 2013-06-15 MED ORDER — INSULIN DETEMIR 100 UNIT/ML FLEXPEN: 5.0000 [IU] | PEN_INJECTOR | Freq: Every day | SUBCUTANEOUS | Status: DC

## 2013-06-15 MED ORDER — INSULIN DETEMIR 100 UNIT/ML ~~LOC~~ SOLN: 5.0000 [IU] | Freq: Every day | SUBCUTANEOUS | Status: DC

## 2013-06-15 MED ORDER — FUROSEMIDE 80 MG PO TABS: 80.0000 mg | ORAL_TABLET | Freq: Every day | ORAL | Status: DC

## 2013-06-15 MED ORDER — BLOOD GLUCOSE METER KIT: PACK | Status: DC

## 2013-06-15 MED ORDER — ISOSORBIDE MONONITRATE ER 60 MG PO TB24: 60.0000 mg | ORAL_TABLET | Freq: Every day | ORAL | Status: DC

## 2013-06-15 NOTE — Discharge Summary (Addendum)
Physician Discharge Summary  Devon Ortiz NOT:[id number] DOB: December 15, 1965 DOA: 06/10/2013  PCP: Philis Fendt, MD  Admit date: 06/10/2013 Discharge date: 06/15/2013  Time spent: 40 minutes  Recommendations for Outpatient Follow-up:  1. Follow up with the heart failure clinic in 1 week, check a b-met and weight. BNP    Component Value Date/Time   PROBNP 3962.0* 06/10/2013 1740   Filed Weights   06/13/13 0619 06/14/13 0534 06/15/13 0437  Weight: 114 kg (251 lb 5.2 oz) 110.2 kg (242 lb 15.2 oz) 109.68 kg (241 lb 12.8 oz)     Discharge Diagnoses:  Principal Problem:   Acute on chronic combined systolic and diastolic congestive heart failure Active Problems:   Hypokalemia   Non-ischemic cardiomyopathy - EF 25-30% Sept 2014   OSA (obstructive sleep apnea) - supposed to be on CPAP   Accelerated essential hypertension   CKD (chronic kidney disease), stage III   Cardiorenal syndrome with renal failure   CVA (cerebral infarction)- symptoms 01/01/13- scattered sm. embolic infarcts   CHF exacerbation   Type II or unspecified type diabetes mellitus with unspecified complication, uncontrolled   Diabetic nephropathy   Hypertensive heart disease   Discharge Condition: stable  Diet recommendation: heart healthy    History of present illness:  48 year old obese meal with history of nonischemic cardiomyopathy NYHA class II (EF of 30-35% from echo in September 2014), uncontrolled hypertension, chronic kidney disease stage III, obstructive sleep apnea, nonobstructive CAD as per cardiac cath in 2012 , noncompliance with medications due to issues with medication affordability presented to the ED today with progressive shortness of breath for past 2 days. Patient at baseline has NYHA class II dyspnea but for past 2 days he has been short of breath on minimal exertion and even at rest since this morning. He denies any orthopnea or PND. He denies any recent illness or sick contacts. For past 2  weeks he has not taken any of these medications except for occasional Lasix as he has undergone out of all his medications. For past 2 weeks he has also noticed increased swelling of his legs and abdominal girth. He reports that his dry weight was 240 pounds in October and today it is 260 pounds.Marland Kitchen   Hospital Course:  Acute on chronic systolic heart failure:  - Most likely due to medication compliance and diet indiscretion.  - Appreciate advance heart failure team assistance.  - Started on lasix drip once Euvolemic change to oral, with good UOP. About 17.5L negative.  - Can benefir form  ACE as an outpatient.  - Continue beta-blocker weight 129->122.0->114 ->109 kg.  - 2D echo 9.12.2015:ejection fraction was 30%, in the range of 25% to 30%. Diffuse hypokinesis. No evidence for LV thrombus.  - Follow up at the heart failure clinic.   CVA (cerebral infarction)- symptoms 01/01/13- scattered sm. embolic infarcts:  - No new neurologic deficit  - Continue plavix for secondary prevention.  - Follow up with neurology as an outpatient.   CKD (chronic kidney disease), stage III/Diabetic nephropathy:  - Due to HTN and diabetes.  - Close follow up to renal function and electrolytes  - continue diuresis   Accelerated essential hypertension/Hypertensive heart disease:  - Due to lack of compliance with meds given difficulty to afford them.  - will resume meds and slowly titrate them up for better control  - Bp has improved with fluid removal. HF clinic will help with medications.   OSA (obstructive sleep apnea)  -continue CPAP QHS  HLD:  - continue statins   Morbidly obesity:  - low calorie diet and exercise discussed with patient.   GERD:  - Continue PPI   Diabetes Mellitus II:  - Last A1C was 6.0 on 2013, now 6.5 will add low dose lantus.  - Great control in house.  - Op diabetes management  Procedures: Echo 5.18.8416: Systolic function was moderately to severely reduced. The estimated  ejection fraction was in the range of 30% to 35%. Global hypokinesis with regional variation. Doppler parameters are consistent with psudonormal left ventricular relaxation (grade2 diastolic dysfunction). The E/A ratio is >1.5. The E/e' ratio is >15, suggseting elevated LV filling pressure. Ejection fraction: 34.5% (MOD, 1-plane). - Mitral valve: Mildly thickened leaflets . Mild regurgitation. - Left atrium: Moderately dilated (38 ml/m2). - Right ventricle: The cavity size was normal. Wall thickness was normal. Systolic function was normal. RV systolic pressure: 60YT Hg (S, est). - Right atrium: Moderately dilated (24 cm2). - Tricuspid valve: Moderate regurgitation. - Pulmonary arteries: PA peak pressure: 63m Hg (S).  Inferior vena cava: The vessel was dilated; the respirophasic diameter changes were blunted (< 50%); findings are consistent with elevated central venous pressure.   Consultations:  Advance heart failure team  Discharge Exam: Filed Vitals:   06/15/13 0437  BP: 113/83  Pulse: 77  Temp: 97.2 F (36.2 C)  Resp: 18    General: A&O x3 Cardiovascular: RRR Respiratory: good air movement CTA B/L  Discharge Instructions      Discharge Orders   Future Appointments Provider Department Dept Phone   06/20/2013 12:30 PM [facility] [phone]  Future Orders Complete By Expires   Ambulatory referral to Nutrition and Diabetic Education  As directed    Diet - low sodium heart healthy  As directed    Increase activity slowly  As directed        Medication List         amLODipine 10 MG tablet  Commonly known as:  NORVASC  Take 10 mg by mouth daily.     atorvastatin 40 MG tablet  Commonly known as:  LIPITOR  Take 1 tablet (40 mg total) by mouth daily at 6 PM.     Blood Glucose Meter kit  Use as instructed     carvedilol 25 MG tablet  Commonly known as:  COREG  Take 1 tablet (25 mg total) by mouth 2 (two) times  daily with a meal.     clopidogrel 75 MG tablet  Commonly known as:  PLAVIX  Take 1 tablet (75 mg total) by mouth daily with breakfast.     furosemide 80 MG tablet  Commonly known as:  LASIX  Take 80 mg by mouth daily. Increase dose to 2 tablets if weight is greater than 3 lbs over dry weight.     hydrALAZINE 25 MG tablet  Commonly known as:  APRESOLINE  Take 3 tablets (75 mg total) by mouth every 8 (eight) hours.     Insulin Detemir 100 UNIT/ML Pen  Commonly known as:  LEVEMIR  Inject 5 Units into the skin daily at 10 pm.     isosorbide mononitrate 60 MG 24 hr tablet  Commonly known as:  IMDUR  Take 1 tablet (60 mg total) by mouth daily.     multivitamin with minerals Tabs tablet  Take 1 tablet by mouth daily.     pantoprazole 40 MG tablet  Commonly known as:  PROTONIX  Take  1 tablet (40 mg total) by mouth daily.     potassium chloride SA 20 MEQ tablet  Commonly known as:  K-DUR,KLOR-CON  Take 1 tablet (20 mEq total) by mouth daily.     spironolactone 25 MG tablet  Commonly known as:  ALDACTONE  Take 0.5 tablets (12.5 mg total) by mouth daily.       Allergies  Allergen Reactions  . Tomato Rash   Follow-up Information   Follow up with [facility] On 06/20/2013. (@ 12:15 pm; Gate Code 02111; Heart Failure Clinic is in [facility] )    Specialty:  Cardiology   Contact information:   [address] [id number] Danice Goltz [place] [address] [phone]       The results of significant diagnostics from this hospitalization (including imaging, microbiology, ancillary and laboratory) are listed below for reference.    Significant Diagnostic Studies: Dg Chest 2 View  06/10/2013   CLINICAL DATA:  Shortness of breath and cough  EXAM: CHEST  2 VIEW  COMPARISON:  12/26/2012  FINDINGS: Cardiac shadow remains enlarged. The lungs are well aerated bilaterally. Patchy changes are again seen in the right lung base likely  related to scarring given their chronicity. No new focal infiltrate is seen. No acute bony abnormality is noted.  IMPRESSION: Chronic changes in the right base likely related to scarring given the chronicity.   Electronically Signed   By: Inez Catalina M.D.   On: 06/10/2013 18:52    Microbiology: No results found for this or any previous visit (from the past 240 hour(s)).   Labs: Basic Metabolic Panel:  Recent Labs Lab 06/10/13 1740 06/12/13 0357 06/12/13 0930 06/13/13 0450 06/14/13 0438 06/15/13 0519  NA 142 141  --  142 139 139  K 3.6* 3.3*  --  3.5* 3.7 4.2  CL 103 99  --  94* 92* 94*  CO2 25 30  --  30 34* 31  GLUCOSE 91 104*  --  115* 102* 115*  BUN 21 24*  --  26* 29* 37*  CREATININE 2.11* 2.19*  --  2.25* 2.66* 2.73*  CALCIUM 9.0 8.6  --  9.4 9.2 9.0  MG  --   --  1.8  --   --   --    Liver Function Tests:  Recent Labs Lab 06/10/13 1740  AST 25  ALT 21  ALKPHOS 119*  BILITOT 2.6*  PROT 7.0  ALBUMIN 3.3*   No results found for this basename: LIPASE, AMYLASE,  in the last 168 hours No results found for this basename: AMMONIA,  in the last 168 hours CBC:  Recent Labs Lab 06/10/13 1740  WBC 4.9  HGB 9.9*  HCT 33.8*  MCV 68.3*  PLT 213   Cardiac Enzymes: No results found for this basename: CKTOTAL, CKMB, CKMBINDEX, TROPONINI,  in the last 168 hours BNP: BNP (last 3 results)  Recent Labs  12/31/12 0448 01/04/13 0500 06/10/13 1740  PROBNP 1543.0* 884.6* 3962.0*   CBG:  Recent Labs Lab 06/14/13 0548 06/14/13 1129 06/14/13 1609 06/14/13 2127 06/15/13 0553  GLUCAP 107* 117* 101* 113* 110*       Signed:  FELIZ ORTIZ, ABRAHAM  [facility] 06/15/2013, 9:05 AM

## 2013-06-15 NOTE — Progress Notes (Signed)
CARDIAC REHAB PHASE I   PRE:  Rate/Rhythm: 80 SR  BP:  Sitting: 106/80     SaO2: 98 RA  MODE:  Ambulation: 600 ft   POST:  Rate/Rhythm: 86 SR  BP:  Sitting: 110/86    SaO2: 95 RA Pt walked 600 ft independently with no c/o and no SOB.  Pt had a good pace and steady gait.  Pt stated someone has talked to him about CRP II, but it was reiterated with patient.  Pt is interested in CRP II in [place].  [id number]  Devon Ortiz, Tacey Heap MS, ACSM RCEP 2:33 PM 06/15/2013

## 2013-06-15 NOTE — Plan of Care (Signed)
Problem: Phase I Progression Outcomes Goal: EF % per last Echo/documented,Core Reminder form on chart Outcome: Completed/Met Date Met:  06/15/13 EF: 30-35% per 2D echo on 06/11/2013

## 2013-06-15 NOTE — Progress Notes (Signed)
   CARE MANAGEMENT NOTE 06/15/2013  Patient:  Devon Ortiz, Devon Ortiz   Account Number:  [id number]  Date Initiated:  06/11/2013  Documentation initiated by:  HUTCHINSON,CRYSTAL  Subjective/Objective Assessment:   Admitted with CHF     Action/Plan:   follow for disposition needs   Anticipated DC Date:  06/14/2013   Anticipated DC Plan:  HOME/SELF CARE      DC Planning Services  CM consult  Medication Assistance      Choice offered to / List presented to:  NA        HH arranged  HH-1 RN      [facility] agency  [facility]   Status of service:  Completed, signed off Medicare Important Message given?   (If response is "NO", the following Medicare IM given date fields will be blank) Date Medicare IM given:   Date Additional Medicare IM given:    Discharge Disposition:  HOME W HOME HEALTH SERVICES  Per UR Regulation:  Reviewed for med. necessity/level of care/duration of stay  If discussed at Long Length of Stay Meetings, dates discussed:    Comments:  06/15/13 10:45 CM brought Levimir discount card to pt in room.   Pt will use for new levimir prescription at discharge. CM called [facility] to confirm pt's followup  with [facility].  No other CM needs were communicated.  Freddy Jaksch, BSN, [place] [phone].  06/09/2013 IV lasix d/c today and changed to POs Plan to watch x 1 day and d/c tomorrow if BMAP ok. CM has provided ed on $4.00 med list this admission beginning in the ED with continued supportive ed by CM/[facility] Disposition Plan:  $4.00 med list, CHF Clinic Crystal Hutchinson RN, BSN, [facility], [place] 06/14/2013  06/11/2013 CHF, wt up to 286/250 baseline.   Increased edema in feet and abd.  Hx/o past Stroke with improved CVA symptoms. Social:  From home alone Home DME:  CPap Meds:  Medication non-compliance d/t running out of meds and taking meds/Lasix more than Rx was written for. MD has provided patient ed on med compliance and how to manage need for refills. MCD  pending status since Sept/Oct 2014. CM provided VE on Rx'd meds and $4.00 pharmacy list.  MD has also instructed to contact CHF Clinic if any problems getting medications.  CM has provided printed list form [employer] and [employer]. CHF Clinic initiated/ Dr. Gala Romney ADD: [id number] RN, BSN, [place], [place] 06/11/2013

## 2013-06-19 ENCOUNTER — Telehealth (HOSPITAL_COMMUNITY): Payer: Self-pay

## 2013-06-19 NOTE — Telephone Encounter (Signed)
Attempted to reschedule tomorrow's appointment due to upcoming inclement weather, left message with call back info.  Will reattempt. 

## 2013-06-20 ENCOUNTER — Encounter (HOSPITAL_COMMUNITY): Payer: Self-pay

## 2013-06-20 ENCOUNTER — Ambulatory Visit: Payer: Self-pay | Admitting: Dietician

## 2013-06-21 ENCOUNTER — Telehealth: Payer: Self-pay | Admitting: Cardiology

## 2013-06-21 NOTE — Telephone Encounter (Signed)
[facility] called with a blood pressure reading of 180/120 this am before taking his medications.  He took his medications and his blood presssure has already come down to 152/90.  Patient does complain of a headache.  I advised [facility] nurse to advise patient to have strict compliance with his medications, decrease salt, and reschedule appt for heart failure clinic (patient missed appt due to weather yesterday).  Call back with any questions or concerns.

## 2013-06-25 ENCOUNTER — Telehealth: Payer: Self-pay | Admitting: Licensed Clinical Social Worker

## 2013-06-25 NOTE — Telephone Encounter (Signed)
CSW referred to assist patient with medication assistance and compliance. CSW contacted patient who confirms appointment in clinic tomorrow and will meet with CSW after clinic appointment. Lasandra Beech, LCSW [phone]

## 2013-06-26 ENCOUNTER — Encounter (HOSPITAL_COMMUNITY): Payer: Self-pay | Admitting: *Deleted

## 2013-06-26 ENCOUNTER — Telehealth: Payer: Self-pay | Admitting: Cardiology

## 2013-06-26 ENCOUNTER — Ambulatory Visit (HOSPITAL_COMMUNITY)
Admission: RE | Admit: 2013-06-26 | Discharge: 2013-06-26 | Disposition: A | Discharge status: Home / Self Care | Payer: Medicaid Other | Source: Ambulatory Visit | Attending: Internal Medicine | Admitting: Internal Medicine

## 2013-06-26 ENCOUNTER — Encounter: Payer: Self-pay | Admitting: Licensed Clinical Social Worker

## 2013-06-26 VITALS — BP 138/86 | HR 85 | Wt 260.2 lb

## 2013-06-26 DIAGNOSIS — I5189 Other ill-defined heart diseases: Secondary | ICD-10-CM

## 2013-06-26 DIAGNOSIS — N183 Chronic kidney disease, stage 3 unspecified: Secondary | ICD-10-CM

## 2013-06-26 DIAGNOSIS — I639 Cerebral infarction, unspecified: Secondary | ICD-10-CM

## 2013-06-26 DIAGNOSIS — Z8673 Personal history of transient ischemic attack (TIA), and cerebral infarction without residual deficits: Secondary | ICD-10-CM | POA: Insufficient documentation

## 2013-06-26 DIAGNOSIS — I509 Heart failure, unspecified: Secondary | ICD-10-CM

## 2013-06-26 DIAGNOSIS — I5022 Chronic systolic (congestive) heart failure: Secondary | ICD-10-CM

## 2013-06-26 DIAGNOSIS — I6350 Cerebral infarction due to unspecified occlusion or stenosis of unspecified cerebral artery: Secondary | ICD-10-CM

## 2013-06-26 DIAGNOSIS — I519 Heart disease, unspecified: Secondary | ICD-10-CM | POA: Insufficient documentation

## 2013-06-26 LAB — BASIC METABOLIC PANEL
BUN: 24 mg/dL — ABNORMAL HIGH (ref 6–23)
CHLORIDE: 107 meq/L (ref 96–112)
CO2: 21 meq/L (ref 19–32)
Calcium: 9.2 mg/dL (ref 8.4–10.5)
Creatinine, Ser: 1.77 mg/dL — ABNORMAL HIGH (ref 0.50–1.35)
GFR calc non Af Amer: 44 mL/min — ABNORMAL LOW (ref >90)
GFR, EST AFRICAN AMERICAN: 51 mL/min — AB (ref >90)
Glucose, Bld: 139 mg/dL — ABNORMAL HIGH (ref 70–99)
POTASSIUM: 4 meq/L (ref 3.7–5.3)
SODIUM: 144 meq/L (ref 137–147)

## 2013-06-26 LAB — PRO B NATRIURETIC PEPTIDE: PRO B NATRI PEPTIDE: 1941 pg/mL — AB (ref 0–125)

## 2013-06-26 MED ORDER — FUROSEMIDE 80 MG PO TABS: ORAL_TABLET | ORAL | Status: DC

## 2013-06-26 MED ORDER — ISOSORBIDE MONONITRATE ER 30 MG PO TB24: 15.0000 mg | ORAL_TABLET | Freq: Every day | ORAL | Status: DC

## 2013-06-26 NOTE — Progress Notes (Signed)
Patient ID: Devon Ortiz, male   DOB: 08/28/1965, 48 y.o.   MRN: [id number] PCP: Dr. Jeanie Cooks  48 yo with history of nonischemic cardiomyopathy, CKD, and HTN presents for cardiology followup.  Patient was hospitalized 2/15 with acute on chronic systolic CHF. He was diuresed with Lasix gtt and metolazone and ended up losing 44 lbs.    He has been on Imdur 60 mg daily and having daily headaches.  He could tolerate Imdur 15 mg daily.  He is able to walk fast for about 10-20 minutes for exercise, then he gets short of breath. No orthopnea/PND.  No chest pain, no tachypalpitations.    Labs (2/15): K 4.2, creatinine 2.73, BNP 3962 => 427  PMH: 1. Nonischemic cardiomyopathy: LHC (9/12) with no significant CAD.  Echo (9/14) with EF 30%.  Echo (2/15) with EF 30-35%, severely dilated LV, mild LVH, normal RV with moderate TR, mild MR. 2. CKD III-IV: Hypertensive nephropathy.  3. OSA 4. HTN 5. Obesity 6. Type II diabetes (no meds).   7. H/o CVA  SH: Nonsmoker, Animal nutritionist.   FH: No premature CAD.  No cardiomyopathy.   ROS: All systems reviewed and negative except as per HPI.   Current Outpatient Prescriptions  Medication Sig Dispense Refill  . amLODipine (NORVASC) 10 MG tablet Take 10 mg by mouth daily.      Marland Kitchen atorvastatin (LIPITOR) 40 MG tablet Take 1 tablet (40 mg total) by mouth daily at 6 PM.  30 tablet  0  . Blood Glucose Monitoring Suppl (BLOOD GLUCOSE METER) kit Use as instructed  1 each  0  . carvedilol (COREG) 25 MG tablet Take 1 tablet (25 mg total) by mouth 2 (two) times daily with a meal.  180 tablet  3  . clopidogrel (PLAVIX) 75 MG tablet Take 1 tablet (75 mg total) by mouth daily with breakfast.  90 tablet  3  . furosemide (LASIX) 80 MG tablet Take 1 tab in AM and 1/2 tab in PM, take extra 1 tab for weight gain of 3 lbs overnight  75 tablet  3  . hydrALAZINE (APRESOLINE) 25 MG tablet Take 3 tablets (75 mg total) by mouth every 8 (eight) hours.  30 tablet  0  . Insulin  Detemir (LEVEMIR) 100 UNIT/ML Pen Inject 5 Units into the skin daily at 10 pm.  15 mL  11  . isosorbide mononitrate (IMDUR) 30 MG 24 hr tablet Take 0.5 tablets (15 mg total) by mouth daily.  15 tablet  0  . Multiple Vitamin (MULTIVITAMIN WITH MINERALS) TABS Take 1 tablet by mouth daily.      . pantoprazole (PROTONIX) 40 MG tablet Take 1 tablet (40 mg total) by mouth daily.  90 tablet  3  . potassium chloride SA (K-DUR,KLOR-CON) 20 MEQ tablet Take 1 tablet (20 mEq total) by mouth daily.  90 tablet  3  . spironolactone (ALDACTONE) 25 MG tablet Take 0.5 tablets (12.5 mg total) by mouth daily.  30 tablet  1   No current facility-administered medications for this encounter.   BP 138/86  Pulse 85  Wt 260 lb 4 oz (118.049 kg)  SpO2 97% General: NAD Neck: JVP 8-9 cm, no thyromegaly or thyroid nodule.  Lungs: Clear to auscultation bilaterally with normal respiratory effort. CV: Nondisplaced PMI.  Heart regular S1/S2, no S3/S4, no murmur.  1+ ankle edema.  No carotid bruit.  Normal pedal pulses.  Abdomen: Soft, nontender, no hepatosplenomegaly, no distention.  Skin: Intact without lesions or rashes.  Neurologic: Alert and oriented x 3.  Psych: Normal affect. Extremities: No clubbing or cyanosis.   Assessment/Plan: 1. Chronic systolic CHF: Nonischemic cardiomyopathy.  Echo in 9/14 showed EF 30%.  Echo in 2/15 did not show significant improvement with EF 30-35%.  Recent admission with significant weight loss via diuresis.  However, since discharge weight has slowly increased and he does appear volume overloaded on exam. NYHA class II-III symptoms.  - Increase Lasix to 80 qam, 40 qpm.   - Continue current Coreg, spironolactone and hydralazine.  Decrease Imdur to 15 mg daily due to headache (has tolerated 15 mg daily in the past).   - Followup in 7 days with BMET.   - No ACEI with significant CKD.  - Patient should qualify for ICD.  QRS is not wide so no CRT.  He is awaiting Medicaid.  I will refer to  EP when he gets Medicaid.  2. CKD: Last creatinine 2.7.  Will increase Lasix with volume overload.  Repeat BMET in 1 week.  3. History of CVA: Patient is on Plavix.   Loralie Champagne 47/07/2013

## 2013-06-26 NOTE — Patient Instructions (Signed)
Increase Furosemide to 80 mg in AM and 40 mg (1/2 tab) in PM  Decrease Isosorbide to 15 mg daily   Labs today  Your physician recommends that you schedule a follow-up appointment in: 1 week

## 2013-06-26 NOTE — Telephone Encounter (Signed)
Just wanted to notify you that when she went for home visit today, the pt was not there.

## 2013-06-26 NOTE — Progress Notes (Signed)
CSW referred to assist with medications and compliance. 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 Patient states he tried to get insurance through the healthcare act but it was too expensive and he couldn't afford the premiums. Patient states he has medication assistance through [facility] and denies any needs at the moment. He states he has a pending medicaid application although "it is stuck right now". Patient was unclear why but stated he has someone assisting him. CSW discussed possible eligibility for the [facility] card and provided patient with the location and number to schedule an appointment for application. CSW asked patient to return call with outcome of appointment with orange card. CSW available if further needs arise. Raquel Sarna, [place]

## 2013-06-26 NOTE — Telephone Encounter (Signed)
OK 

## 2013-06-26 NOTE — Telephone Encounter (Signed)
FYI-will inform Dr Herbie Baltimore.

## 2013-06-27 NOTE — Telephone Encounter (Signed)
Message forwarded to Dr. Harding/Sharon, RN. 

## 2013-06-27 NOTE — Telephone Encounter (Signed)
Pt is not going to be seen this week.Pt have had a death in his family.

## 2013-06-28 NOTE — Telephone Encounter (Signed)
OK 

## 2013-07-02 ENCOUNTER — Telehealth: Payer: Self-pay | Admitting: Cardiology

## 2013-07-02 NOTE — Telephone Encounter (Signed)
Message forwarded to Dr. Harding/Sharon, RN. 

## 2013-07-02 NOTE — Telephone Encounter (Signed)
OK 

## 2013-07-02 NOTE — Telephone Encounter (Signed)
He was not present for his home visit today. She said she will have to discharge him from their service today.

## 2013-07-05 ENCOUNTER — Encounter (HOSPITAL_COMMUNITY): Payer: Self-pay

## 2013-07-08 ENCOUNTER — Telehealth: Payer: Self-pay | Admitting: *Deleted

## 2013-07-08 NOTE — Telephone Encounter (Signed)
HOME HEALTH CERT AND PLAN OF CARE--START 2//23/15TO 08/15/13

## 2013-07-19 ENCOUNTER — Encounter: Payer: Self-pay | Admitting: Internal Medicine

## 2013-08-14 ENCOUNTER — Encounter (HOSPITAL_COMMUNITY): Payer: Self-pay

## 2013-08-14 ENCOUNTER — Inpatient Hospital Stay (HOSPITAL_COMMUNITY): Admission: RE | Admit: 2013-08-14 | Payer: Medicaid Other | Source: Ambulatory Visit

## 2013-10-21 ENCOUNTER — Ambulatory Visit: Payer: Medicaid Other | Admitting: Pulmonary Disease

## 2014-02-07 ENCOUNTER — Other Ambulatory Visit: Payer: Self-pay

## 2014-04-25 HISTORY — PX: TRANSTHORACIC ECHOCARDIOGRAM: SHX275

## 2014-05-05 ENCOUNTER — Emergency Department (HOSPITAL_COMMUNITY): Payer: Medicaid Other

## 2014-05-05 ENCOUNTER — Encounter (HOSPITAL_COMMUNITY): Payer: Self-pay | Admitting: Emergency Medicine

## 2014-05-05 ENCOUNTER — Inpatient Hospital Stay (HOSPITAL_COMMUNITY)
Admission: EM | Admit: 2014-05-05 | Discharge: 2014-05-18 | DRG: 291 | Disposition: A | Discharge status: Home / Self Care | Payer: Medicaid Other | Attending: Internal Medicine | Admitting: Internal Medicine

## 2014-05-05 DIAGNOSIS — Z6841 Body Mass Index (BMI) 40.0 and over, adult: Secondary | ICD-10-CM

## 2014-05-05 DIAGNOSIS — E876 Hypokalemia: Secondary | ICD-10-CM | POA: Diagnosis present

## 2014-05-05 DIAGNOSIS — Z91199 Patient's noncompliance with other medical treatment and regimen due to unspecified reason: Secondary | ICD-10-CM

## 2014-05-05 DIAGNOSIS — Z91148 Patient's other noncompliance with medication regimen for other reason: Secondary | ICD-10-CM | POA: Insufficient documentation

## 2014-05-05 DIAGNOSIS — G4733 Obstructive sleep apnea (adult) (pediatric): Secondary | ICD-10-CM | POA: Diagnosis present

## 2014-05-05 DIAGNOSIS — N179 Acute kidney failure, unspecified: Secondary | ICD-10-CM | POA: Insufficient documentation

## 2014-05-05 DIAGNOSIS — I5042 Chronic combined systolic (congestive) and diastolic (congestive) heart failure: Secondary | ICD-10-CM | POA: Diagnosis present

## 2014-05-05 DIAGNOSIS — R0602 Shortness of breath: Secondary | ICD-10-CM | POA: Insufficient documentation

## 2014-05-05 DIAGNOSIS — I10 Essential (primary) hypertension: Secondary | ICD-10-CM | POA: Insufficient documentation

## 2014-05-05 DIAGNOSIS — Z9114 Patient's other noncompliance with medication regimen: Secondary | ICD-10-CM

## 2014-05-05 DIAGNOSIS — N183 Chronic kidney disease, stage 3 unspecified: Secondary | ICD-10-CM | POA: Diagnosis present

## 2014-05-05 DIAGNOSIS — I429 Cardiomyopathy, unspecified: Secondary | ICD-10-CM | POA: Diagnosis present

## 2014-05-05 DIAGNOSIS — N189 Chronic kidney disease, unspecified: Secondary | ICD-10-CM

## 2014-05-05 DIAGNOSIS — Z833 Family history of diabetes mellitus: Secondary | ICD-10-CM

## 2014-05-05 DIAGNOSIS — Z79899 Other long term (current) drug therapy: Secondary | ICD-10-CM

## 2014-05-05 DIAGNOSIS — I130 Hypertensive heart and chronic kidney disease with heart failure and stage 1 through stage 4 chronic kidney disease, or unspecified chronic kidney disease: Principal | ICD-10-CM | POA: Diagnosis present

## 2014-05-05 DIAGNOSIS — R748 Abnormal levels of other serum enzymes: Secondary | ICD-10-CM | POA: Diagnosis present

## 2014-05-05 DIAGNOSIS — Z599 Problem related to housing and economic circumstances, unspecified: Secondary | ICD-10-CM

## 2014-05-05 DIAGNOSIS — Z9119 Patient's noncompliance with other medical treatment and regimen: Secondary | ICD-10-CM

## 2014-05-05 DIAGNOSIS — Z8673 Personal history of transient ischemic attack (TIA), and cerebral infarction without residual deficits: Secondary | ICD-10-CM

## 2014-05-05 DIAGNOSIS — I639 Cerebral infarction, unspecified: Secondary | ICD-10-CM | POA: Diagnosis present

## 2014-05-05 DIAGNOSIS — I5043 Acute on chronic combined systolic (congestive) and diastolic (congestive) heart failure: Secondary | ICD-10-CM | POA: Diagnosis present

## 2014-05-05 DIAGNOSIS — E1121 Type 2 diabetes mellitus with diabetic nephropathy: Secondary | ICD-10-CM | POA: Diagnosis present

## 2014-05-05 DIAGNOSIS — I428 Other cardiomyopathies: Secondary | ICD-10-CM

## 2014-05-05 DIAGNOSIS — I119 Hypertensive heart disease without heart failure: Secondary | ICD-10-CM | POA: Diagnosis present

## 2014-05-05 LAB — CBC
HEMATOCRIT: 40.9 % (ref 39.0–52.0)
Hemoglobin: 11.6 g/dL — ABNORMAL LOW (ref 13.0–17.0)
MCH: 19.9 pg — ABNORMAL LOW (ref 26.0–34.0)
MCHC: 28.4 g/dL — AB (ref 30.0–36.0)
MCV: 70.3 fL — ABNORMAL LOW (ref 78.0–100.0)
Platelets: 248 10*3/uL (ref 150–400)
RBC: 5.82 MIL/uL — AB (ref 4.22–5.81)
RDW: 20.3 % — ABNORMAL HIGH (ref 11.5–15.5)
WBC: 5.3 10*3/uL (ref 4.0–10.5)

## 2014-05-05 LAB — BASIC METABOLIC PANEL
Anion gap: 10 (ref 5–15)
BUN: 27 mg/dL — ABNORMAL HIGH (ref 6–23)
CO2: 25 mmol/L (ref 19–32)
CREATININE: 2.23 mg/dL — AB (ref 0.50–1.35)
Calcium: 8.1 mg/dL — ABNORMAL LOW (ref 8.4–10.5)
Chloride: 102 mEq/L (ref 96–112)
GFR calc Af Amer: 39 mL/min — ABNORMAL LOW (ref >90)
GFR, EST NON AFRICAN AMERICAN: 33 mL/min — AB (ref >90)
Glucose, Bld: 112 mg/dL — ABNORMAL HIGH (ref 70–99)
Potassium: 2.8 mmol/L — ABNORMAL LOW (ref 3.5–5.1)
Sodium: 137 mmol/L (ref 135–145)

## 2014-05-05 LAB — BRAIN NATRIURETIC PEPTIDE: B Natriuretic Peptide: 903.7 pg/mL — ABNORMAL HIGH (ref 0.0–100.0)

## 2014-05-05 LAB — I-STAT TROPONIN, ED: Troponin i, poc: 0.03 ng/mL (ref 0.00–0.08)

## 2014-05-05 LAB — MAGNESIUM: Magnesium: 1.8 mg/dL (ref 1.5–2.5)

## 2014-05-05 MED ORDER — FUROSEMIDE 10 MG/ML IJ SOLN
60.0000 mg | Freq: Once | INTRAMUSCULAR | Status: AC
  Administered 2014-05-05: 60 mg via INTRAVENOUS
  Filled 2014-05-05: qty 8

## 2014-05-05 MED ORDER — NITROGLYCERIN 0.4 MG SL SUBL
0.4000 mg | SUBLINGUAL_TABLET | SUBLINGUAL | Status: DC | PRN
  Administered 2014-05-05: 0.4 mg via SUBLINGUAL
  Filled 2014-05-05: qty 1

## 2014-05-05 MED ORDER — SODIUM CHLORIDE 0.9 % IV SOLN
INTRAVENOUS | Status: DC
  Administered 2014-05-05: 20:00:00 via INTRAVENOUS

## 2014-05-05 MED ORDER — POTASSIUM CHLORIDE 10 MEQ/100ML IV SOLN
10.0000 meq | INTRAVENOUS | Status: AC
  Administered 2014-05-05 – 2014-05-06 (×2): 10 meq via INTRAVENOUS
  Filled 2014-05-05 (×2): qty 100

## 2014-05-05 MED ORDER — HYDRALAZINE HCL 20 MG/ML IJ SOLN
20.0000 mg | Freq: Once | INTRAMUSCULAR | Status: AC
  Administered 2014-05-05: 20 mg via INTRAVENOUS
  Filled 2014-05-05: qty 1
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
## 2014-05-05 NOTE — ED Notes (Signed)
Pt c/o shob, pt states that he has CHF and has gained 4 pounds in the past day.  Pt has been out of his medications for week and half.

## 2014-05-06 DIAGNOSIS — I429 Cardiomyopathy, unspecified: Secondary | ICD-10-CM

## 2014-05-06 DIAGNOSIS — I5041 Acute combined systolic (congestive) and diastolic (congestive) heart failure: Secondary | ICD-10-CM

## 2014-05-06 DIAGNOSIS — N183 Chronic kidney disease, stage 3 (moderate): Secondary | ICD-10-CM

## 2014-05-06 DIAGNOSIS — E876 Hypokalemia: Secondary | ICD-10-CM

## 2014-05-06 LAB — CBC
HCT: 42.1 % (ref 39.0–52.0)
Hemoglobin: 12.2 g/dL — ABNORMAL LOW (ref 13.0–17.0)
MCH: 20.5 pg — AB (ref 26.0–34.0)
MCHC: 29 g/dL — ABNORMAL LOW (ref 30.0–36.0)
MCV: 70.6 fL — ABNORMAL LOW (ref 78.0–100.0)
Platelets: 252 10*3/uL (ref 150–400)
RBC: 5.96 MIL/uL — ABNORMAL HIGH (ref 4.22–5.81)
RDW: 20.4 % — ABNORMAL HIGH (ref 11.5–15.5)
WBC: 5.7 10*3/uL (ref 4.0–10.5)

## 2014-05-06 LAB — GLUCOSE, CAPILLARY
GLUCOSE-CAPILLARY: 99 mg/dL (ref 70–99)
GLUCOSE-CAPILLARY: 127 mg/dL — AB (ref 70–99)
Glucose-Capillary: 86 mg/dL (ref 70–99)
Glucose-Capillary: 151 mg/dL — ABNORMAL HIGH (ref 70–99)
Glucose-Capillary: 114 mg/dL — ABNORMAL HIGH (ref 70–99)

## 2014-05-06 LAB — BASIC METABOLIC PANEL
ANION GAP: 10 (ref 5–15)
BUN: 23 mg/dL (ref 6–23)
CALCIUM: 8.5 mg/dL (ref 8.4–10.5)
CO2: 27 mmol/L (ref 19–32)
Chloride: 107 mEq/L (ref 96–112)
Creatinine, Ser: 1.82 mg/dL — ABNORMAL HIGH (ref 0.50–1.35)
GFR calc Af Amer: 49 mL/min — ABNORMAL LOW (ref >90)
GFR calc non Af Amer: 43 mL/min — ABNORMAL LOW (ref >90)
Glucose, Bld: 117 mg/dL — ABNORMAL HIGH (ref 70–99)
Potassium: 3.1 mmol/L — ABNORMAL LOW (ref 3.5–5.1)
SODIUM: 144 mmol/L (ref 135–145)

## 2014-05-06 LAB — TROPONIN I
Troponin I: 0.04 ng/mL — ABNORMAL HIGH (ref <0.031)
Troponin I: 0.04 ng/mL — ABNORMAL HIGH (ref <0.031)
Troponin I: 0.04 ng/mL — ABNORMAL HIGH (ref <0.031)

## 2014-05-06 LAB — MAGNESIUM: Magnesium: 1.8 mg/dL (ref 1.5–2.5)

## 2014-05-06 MED ORDER — CLOPIDOGREL BISULFATE 75 MG PO TABS
75.0000 mg | ORAL_TABLET | Freq: Every day | ORAL | Status: DC
  Administered 2014-05-06 – 2014-05-18 (×13): 75 mg via ORAL
  Filled 2014-05-06 (×15): qty 1

## 2014-05-06 MED ORDER — POTASSIUM CHLORIDE CRYS ER 20 MEQ PO TBCR
20.0000 meq | EXTENDED_RELEASE_TABLET | Freq: Every day | ORAL | Status: DC
  Administered 2014-05-06: 20 meq via ORAL
  Filled 2014-05-06: qty 1

## 2014-05-06 MED ORDER — SPIRONOLACTONE 12.5 MG HALF TABLET
12.5000 mg | ORAL_TABLET | Freq: Every day | ORAL | Status: DC
  Administered 2014-05-06 – 2014-05-13 (×7): 12.5 mg via ORAL
  Filled 2014-05-06 (×9): qty 1

## 2014-05-06 MED ORDER — HYDRALAZINE HCL 50 MG PO TABS
50.0000 mg | ORAL_TABLET | Freq: Three times a day (TID) | ORAL | Status: DC
  Administered 2014-05-07 – 2014-05-18 (×35): 50 mg via ORAL
  Filled 2014-05-06 (×39): qty 1

## 2014-05-06 MED ORDER — INSULIN DETEMIR 100 UNIT/ML ~~LOC~~ SOLN
5.0000 [IU] | Freq: Every day | SUBCUTANEOUS | Status: DC
  Administered 2014-05-06 – 2014-05-17 (×12): 5 [IU] via SUBCUTANEOUS
  Filled 2014-05-06 (×14): qty 0.05

## 2014-05-06 MED ORDER — INFLUENZA VAC SPLIT QUAD 0.5 ML IM SUSY
0.5000 mL | PREFILLED_SYRINGE | INTRAMUSCULAR | Status: AC
  Administered 2014-05-06: 0.5 mL via INTRAMUSCULAR
  Filled 2014-05-06: qty 0.5

## 2014-05-06 MED ORDER — SODIUM CHLORIDE 0.9 % IJ SOLN
3.0000 mL | Freq: Two times a day (BID) | INTRAMUSCULAR | Status: DC
  Administered 2014-05-06 – 2014-05-17 (×21): 3 mL via INTRAVENOUS

## 2014-05-06 MED ORDER — SODIUM CHLORIDE 0.9 % IV SOLN: 250.0000 mL | INTRAVENOUS | Status: DC | PRN

## 2014-05-06 MED ORDER — POTASSIUM CHLORIDE CRYS ER 20 MEQ PO TBCR
40.0000 meq | EXTENDED_RELEASE_TABLET | Freq: Three times a day (TID) | ORAL | Status: AC
  Administered 2014-05-06 (×2): 40 meq via ORAL
  Filled 2014-05-06 (×2): qty 2

## 2014-05-06 MED ORDER — PNEUMOCOCCAL VAC POLYVALENT 25 MCG/0.5ML IJ INJ
0.5000 mL | INJECTION | INTRAMUSCULAR | Status: AC
  Administered 2014-05-06: 0.5 mL via INTRAMUSCULAR
  Filled 2014-05-06: qty 0.5

## 2014-05-06 MED ORDER — HEPARIN SODIUM (PORCINE) 5000 UNIT/ML IJ SOLN
5000.0000 [IU] | Freq: Three times a day (TID) | INTRAMUSCULAR | Status: DC
  Administered 2014-05-06 – 2014-05-18 (×38): 5000 [IU] via SUBCUTANEOUS
  Filled 2014-05-06 (×41): qty 1

## 2014-05-06 MED ORDER — POTASSIUM CHLORIDE CRYS ER 20 MEQ PO TBCR
40.0000 meq | EXTENDED_RELEASE_TABLET | Freq: Once | ORAL | Status: AC
  Administered 2014-05-06: 40 meq via ORAL
  Filled 2014-05-06: qty 2

## 2014-05-06 MED ORDER — INSULIN ASPART 100 UNIT/ML ~~LOC~~ SOLN
0.0000 [IU] | Freq: Three times a day (TID) | SUBCUTANEOUS | Status: DC
  Administered 2014-05-06: 2 [IU] via SUBCUTANEOUS
  Administered 2014-05-06: 3 [IU] via SUBCUTANEOUS
  Administered 2014-05-07 – 2014-05-11 (×5): 2 [IU] via SUBCUTANEOUS
  Administered 2014-05-12: 3 [IU] via SUBCUTANEOUS
  Administered 2014-05-13 – 2014-05-16 (×4): 2 [IU] via SUBCUTANEOUS
  Administered 2014-05-17: 3 [IU] via SUBCUTANEOUS

## 2014-05-06 MED ORDER — ATORVASTATIN CALCIUM 40 MG PO TABS
40.0000 mg | ORAL_TABLET | Freq: Every day | ORAL | Status: DC
  Administered 2014-05-06 – 2014-05-17 (×12): 40 mg via ORAL
  Filled 2014-05-06 (×14): qty 1

## 2014-05-06 MED ORDER — CARVEDILOL 25 MG PO TABS
25.0000 mg | ORAL_TABLET | Freq: Two times a day (BID) | ORAL | Status: DC
  Administered 2014-05-06 – 2014-05-18 (×24): 25 mg via ORAL
  Filled 2014-05-06 (×24): qty 1

## 2014-05-06 MED ORDER — FUROSEMIDE 10 MG/ML IJ SOLN
40.0000 mg | Freq: Two times a day (BID) | INTRAMUSCULAR | Status: DC
  Administered 2014-05-06 – 2014-05-07 (×4): 40 mg via INTRAVENOUS
  Filled 2014-05-06 (×4): qty 4

## 2014-05-06 MED ORDER — HYDRALAZINE HCL 50 MG PO TABS
75.0000 mg | ORAL_TABLET | Freq: Three times a day (TID) | ORAL | Status: DC
  Administered 2014-05-06: 75 mg via ORAL
  Filled 2014-05-06 (×4): qty 1

## 2014-05-06 MED ORDER — PANTOPRAZOLE SODIUM 40 MG PO TBEC
40.0000 mg | DELAYED_RELEASE_TABLET | Freq: Every day | ORAL | Status: DC
  Administered 2014-05-06 – 2014-05-18 (×13): 40 mg via ORAL
  Filled 2014-05-06 (×13): qty 1

## 2014-05-06 MED ORDER — SODIUM CHLORIDE 0.9 % IJ SOLN
3.0000 mL | Freq: Two times a day (BID) | INTRAMUSCULAR | Status: DC
  Administered 2014-05-06 – 2014-05-16 (×4): 3 mL via INTRAVENOUS

## 2014-05-06 MED ORDER — ISOSORBIDE MONONITRATE 15 MG HALF TABLET
15.0000 mg | ORAL_TABLET | Freq: Every day | ORAL | Status: DC
  Administered 2014-05-06 – 2014-05-18 (×12): 15 mg via ORAL
  Filled 2014-05-06 (×13): qty 1

## 2014-05-06 MED ORDER — SODIUM CHLORIDE 0.9 % IJ SOLN: 3.0000 mL | INTRAMUSCULAR | Status: DC | PRN

## 2014-05-06 MED ORDER — ACETAMINOPHEN 325 MG PO TABS: 650.0000 mg | ORAL_TABLET | Freq: Four times a day (QID) | ORAL | Status: DC | PRN

## 2014-05-06 MED ORDER — ACETAMINOPHEN 650 MG RE SUPP: 650.0000 mg | Freq: Four times a day (QID) | RECTAL | Status: DC | PRN
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
## 2014-05-06 NOTE — Progress Notes (Signed)
[facility] PROGRESS NOTE   Devon Ortiz:[id number] DOB: 1965-06-19 DOA: 05/05/2014 PCP: Dorrene German, MD  HPI/Subjective: Feels better this morning, had an incident of dizziness while he was sitting. Blood pressure when checked it was in the low side with the dizziness episode.  Assessment/Plan: Active Problems:   Hypokalemia   H/O noncompliance with medical treatment, presenting hazards to health   Non-ischemic cardiomyopathy - EF 25-30% Sept 2014   Chronic combined systolic and diastolic HF (heart failure), NYHA class 2   CKD (chronic kidney disease), stage III   Stroke   CHF exacerbation   Hypertensive heart disease   Chronic systolic CHF (congestive heart failure)   Acute combined systolic and diastolic congestive heart failure    Acute combined systolic and diastolic CHF Reported rapid weight gain, shortness of breath and orthopnea.  Last 2-D echo from February 2015 showed LVEF of 30-35% with grade 2 diastolic dysfunction, repeat echo. Patient been off of his medication since the beginning of the month, reported he lost his insurance. Fluid restriction, monitor intake/output, daily weight, restrict salt intake. Lasix 40 mg twice a day, continue Aldactone. Continue home medications including Coreg, Imdur, and hydralazine.  Elevated troponin Slightly elevated troponin of 0.04, likely secondary to CHF. Patient denies any chest pain, EKG did not show any acute findings (sinus tachycardia).  Hypokalemia Replaced with oral and parenteral supplements.  Noncompliance with medical therapy Patient reports that he lost his Medicaid, was not able to drink his medications since beginning of the month. Explained to him most of the cardiovascular medications is in the [employer] $4 list.  CKD stage III Presented with creatinine of 2.2, improved since yesterday to 1.8 after initiation of Lasix.  History of a stroke Patient is on Plavix, continue.  Code Status: Full  code Family Communication: Plan discussed with the patient. Disposition Plan: Remains inpatient   Consultants:  None  Procedures:  None  Antibiotics:  None   Objective: Filed Vitals:   05/06/14 0528  BP: 144/103  Pulse: 101  Temp: 98.3 F (36.8 C)  Resp: 18    Intake/Output Summary (Last 24 hours) at 05/06/14 1228 Last data filed at 05/06/14 0734  Gross per 24 hour  Intake  49.17 ml  Output    650 ml  Net -600.83 ml   Filed Weights   05/06/14 0114  Weight: 117.754 kg (259 lb 9.6 oz)    Exam: General: Alert and awake, oriented x3, not in any acute distress. HEENT: anicteric sclera, pupils reactive to light and accommodation, EOMI CVS: S1-S2 clear, no murmur rubs or gallops Chest: clear to auscultation bilaterally, no wheezing, rales or rhonchi Abdomen: soft nontender, nondistended, normal bowel sounds, no organomegaly Extremities: no cyanosis, clubbing or edema noted bilaterally Neuro: Cranial nerves II-XII intact, no focal neurological deficits  Data Reviewed: Basic Metabolic Panel:  Recent Labs Lab 05/05/14 1954 05/05/14 2258 05/06/14 0512  NA 137  --  144  K 2.8*  --  3.1*  CL 102  --  107  CO2 25  --  27  GLUCOSE 112*  --  117*  BUN 27*  --  23  CREATININE 2.23*  --  1.82*  CALCIUM 8.1*  --  8.5  MG  --  1.8 1.8   Liver Function Tests: No results for input(s): AST, ALT, ALKPHOS, BILITOT, PROT, ALBUMIN in the last 168 hours. No results for input(s): LIPASE, AMYLASE in the last 168 hours. No results for input(s): AMMONIA in the last 168 hours. CBC:  Recent Labs Lab 05/05/14 1954 05/06/14 0512  WBC 5.3 5.7  HGB 11.6* 12.2*  HCT 40.9 42.1  MCV 70.3* 70.6*  PLT 248 252   Cardiac Enzymes:  Recent Labs Lab 05/06/14 0030 05/06/14 0610  TROPONINI 0.04* 0.04*   BNP (last 3 results)  Recent Labs  06/10/13 1740 06/15/13 0519 06/26/13 1238  PROBNP 3962.0* 426.5* 1941.0*   CBG:  Recent Labs Lab 05/06/14 0154 05/06/14 0833  05/06/14 1145  GLUCAP 86 151* 99    Micro No results found for this or any previous visit (from the past 240 hour(s)).   Studies: Dg Chest 2 View  05/05/2014   CLINICAL DATA:  Progressive shortness of breath since 02/16/2014  EXAM: CHEST  2 VIEW  COMPARISON:  06/10/2013 hand 12/26/2012  FINDINGS: There is chronic cardiomegaly. There is slight pulmonary vascular prominence. There is chronic accentuation of the interstitial markings. No effusions. No acute osseous abnormality.  IMPRESSION: Chronic cardiomegaly with slight pulmonary vascular congestion.   Electronically Signed   By: Geanie Cooley M.D.   On: 05/05/2014 19:51    Scheduled Meds: . atorvastatin  40 mg Oral q1800  . carvedilol  25 mg Oral BID WC  . clopidogrel  75 mg Oral Q breakfast  . furosemide  40 mg Intravenous BID  . heparin  5,000 Units Subcutaneous 3 times per day  . hydrALAZINE  75 mg Oral 3 times per day  . insulin aspart  0-15 Units Subcutaneous TID WC  . insulin detemir  5 Units Subcutaneous Q2200  . isosorbide mononitrate  15 mg Oral Daily  . pantoprazole  40 mg Oral Daily  . potassium chloride SA  20 mEq Oral Daily  . sodium chloride  3 mL Intravenous Q12H  . sodium chloride  3 mL Intravenous Q12H  . spironolactone  12.5 mg Oral Daily   Continuous Infusions:      Time spent: 35 minutes    [facility]  [facility] Pager [phone] If 7PM-7AM, please contact night-coverage at [email or web address], password [facility] 05/06/2014, 12:28 PM  LOS: 1 day

## 2014-05-07 LAB — RENAL FUNCTION PANEL
Albumin: 3.2 g/dL — ABNORMAL LOW (ref 3.5–5.2)
Anion gap: 9 (ref 5–15)
BUN: 24 mg/dL — AB (ref 6–23)
CALCIUM: 8.3 mg/dL — AB (ref 8.4–10.5)
CO2: 23 mmol/L (ref 19–32)
CREATININE: 2.1 mg/dL — AB (ref 0.50–1.35)
Chloride: 106 mEq/L (ref 96–112)
GFR calc Af Amer: 42 mL/min — ABNORMAL LOW (ref >90)
GFR calc non Af Amer: 36 mL/min — ABNORMAL LOW (ref >90)
GLUCOSE: 90 mg/dL (ref 70–99)
PHOSPHORUS: 3.3 mg/dL (ref 2.3–4.6)
Potassium: 3.6 mmol/L (ref 3.5–5.1)
Sodium: 138 mmol/L (ref 135–145)

## 2014-05-07 LAB — MAGNESIUM: Magnesium: 1.8 mg/dL (ref 1.5–2.5)

## 2014-05-07 LAB — GLUCOSE, CAPILLARY
GLUCOSE-CAPILLARY: 100 mg/dL — AB (ref 70–99)
Glucose-Capillary: 94 mg/dL (ref 70–99)
Glucose-Capillary: 150 mg/dL — ABNORMAL HIGH (ref 70–99)
Glucose-Capillary: 142 mg/dL — ABNORMAL HIGH (ref 70–99)

## 2014-05-07 MED ORDER — AZITHROMYCIN 250 MG PO TABS
250.0000 mg | ORAL_TABLET | Freq: Every day | ORAL | Status: DC
  Administered 2014-05-08 – 2014-05-09 (×2): 250 mg via ORAL
  Filled 2014-05-07 (×3): qty 1

## 2014-05-07 MED ORDER — AZITHROMYCIN 500 MG PO TABS
500.0000 mg | ORAL_TABLET | Freq: Every day | ORAL | Status: AC
  Administered 2014-05-07: 500 mg via ORAL
  Filled 2014-05-07: qty 1

## 2014-05-07 MED ORDER — CEFTRIAXONE SODIUM IN DEXTROSE 20 MG/ML IV SOLN
1.0000 g | INTRAVENOUS | Status: DC
  Administered 2014-05-07 – 2014-05-09 (×3): 1 g via INTRAVENOUS
  Filled 2014-05-07 (×4): qty 50

## 2014-05-07 MED ORDER — POTASSIUM CHLORIDE 20 MEQ/15ML (10%) PO SOLN
40.0000 meq | Freq: Once | ORAL | Status: AC
  Administered 2014-05-07: 40 meq via ORAL
  Filled 2014-05-07: qty 30

## 2014-05-07 MED ORDER — FUROSEMIDE 40 MG PO TABS
40.0000 mg | ORAL_TABLET | Freq: Every day | ORAL | Status: DC
  Administered 2014-05-08 – 2014-05-09 (×2): 40 mg via ORAL
  Filled 2014-05-07 (×2): qty 1

## 2014-05-07 NOTE — Progress Notes (Addendum)
[facility] PROGRESS NOTE  Devon Ortiz ZOX:[id number] DOB: Dec 13, 1965 DOA: 05/05/2014 PCP: Dorrene German, MD  Summary/Subjective: I have seen and examined Mr Devon Ortiz at bedside and reviewed his chart. [name] is a 49 y.o. 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 The concern was for Acute decompensation of CHF. He still has SOB and says that his urine output is reduced despite IV lasix. Wonder if element of infection as renal function is slightly worse. Will therefore add Ceftriaxone/Zithromax and change Lasix to oral and reassess. Plan Acute combined systolic and diastolic CHF  Reported rapid weight gain, shortness of breath and orthopnea.  Last 2-D echo from February 2015 showed LVEF of 30-35% with grade 2 diastolic dysfunction, repeat echo.  Patient been off of his medication since the beginning of the month, reported he lost his insurance.  Fluid restriction, monitor intake/output, daily weight, restrict salt intake.  Lasix 40 mg a day, continue Aldactone.  Continue home medications including Coreg, Imdur, and hydralazine.  Ceftriaxone/Zithromax Elevated troponin  Slightly elevated troponin of 0.04, likely secondary to CHF.  Patient denies any chest pain, EKG did not show any acute findings (sinus tachycardia).  Hypokalemia  Replaced with oral and parenteral supplements.  Noncompliance with medical therapy  Patient reports that he lost his Medicaid, was not able to drink his medications since beginning of the month.  He will need help with meds at d/c. Case management will assist as possible.  CKD stage III  Presented with creatinine  of 2.2,now 2.10. Back off Lasix.  UA History of a stroke  Patient is on Plavix, continue.  Code Status: Full code  Family Communication: Plan discussed with the patient.  Disposition Plan: Remains inpatient    Consultants:  None  Procedures:  None  Antibiotics:  Ceftriaxone/Zithromax.   Objective: Filed Vitals:   05/07/14 1424  BP: 123/49  Pulse: 80  Temp: 98.2 F (36.8 C)  Resp: 20    Intake/Output Summary (Last 24 hours) at 05/07/14 1802 Last data filed at 05/07/14 1425  Gross per 24 hour  Intake    480 ml  Output   1325 ml  Net   -845 ml   Filed Weights   05/06/14 0114 05/07/14 0635  Weight: 117.754 kg (259 lb 9.6 oz) 117.6 kg (259 lb 4.2 oz)    Exam:   General:  Comfortable at rest  Cardiovascular: RRR. S1S2 normal.  Respiratory: No wheezing , rales or rhonchi.  Abdomen: soft, non tender. +BS  Musculoskeletal: Some pedal edema, evidence of improvement with shrinking skin.  Data Reviewed: Basic Metabolic Panel:  Recent Labs Lab 05/05/14 1954 05/05/14 2258 05/06/14 0512 05/07/14 0405  NA 137  --  144 138  K 2.8*  --  3.1* 3.6  CL 102  --  107 106  CO2 25  --  27 23  GLUCOSE 112*  --  117* 90  BUN 27*  --  23 24*  CREATININE 2.23*  --  1.82* 2.10*  CALCIUM 8.1*  --  8.5 8.3*  MG  --  1.8 1.8 1.8  PHOS  --   --   --  3.3   Liver Function Tests:  Recent Labs Lab 05/07/14 0405  ALBUMIN 3.2*   No results for input(s): LIPASE, AMYLASE in the last 168 hours. No results for input(s): AMMONIA in the last 168 hours. CBC:  Recent Labs Lab 05/05/14 1954 05/06/14 0512  WBC 5.3 5.7  HGB 11.6* 12.2*  HCT 40.9 42.1  MCV 70.3* 70.6*  PLT 248 252   Cardiac Enzymes:  Recent Labs Lab 05/06/14 0030 05/06/14 0610 05/06/14 1155  TROPONINI 0.04* 0.04* 0.04*   BNP (last 3 results)  Recent Labs  06/10/13 1740 06/15/13 0519 06/26/13 1238  PROBNP 3962.0* 426.5* 1941.0*   CBG:  Recent Labs Lab 05/06/14 1633 05/06/14 2154  05/07/14 0742 05/07/14 1140 05/07/14 1656  GLUCAP 127* 114* 94 100* 150*    No results found for this or any previous visit (from the past 240 hour(s)).   Studies: Dg Chest 2 View  05/05/2014   CLINICAL DATA:  Progressive shortness of breath since 02/16/2014  EXAM: CHEST  2 VIEW  COMPARISON:  06/10/2013 hand 12/26/2012  FINDINGS: There is chronic cardiomegaly. There is slight pulmonary vascular prominence. There is chronic accentuation of the interstitial markings. No effusions. No acute osseous abnormality.  IMPRESSION: Chronic cardiomegaly with slight pulmonary vascular congestion.   Electronically Signed   By: Geanie Cooley M.D.   On: 05/05/2014 19:51    Scheduled Meds: . atorvastatin  40 mg Oral q1800  . azithromycin  500 mg Oral Daily   Followed by  . [START ON 05/08/2014] azithromycin  250 mg Oral Daily  . carvedilol  25 mg Oral BID WC  . cefTRIAXone (ROCEPHIN)  IV  1 g Intravenous Q24H  . clopidogrel  75 mg Oral Q breakfast  . [START ON 05/08/2014] furosemide  40 mg Oral Daily  . heparin  5,000 Units Subcutaneous 3 times per day  . hydrALAZINE  50 mg Oral 3 times per day  . insulin aspart  0-15 Units Subcutaneous TID WC  . insulin detemir  5 Units Subcutaneous Q2200  . isosorbide mononitrate  15 mg Oral Daily  . pantoprazole  40 mg Oral Daily  . potassium chloride  40 mEq Oral Once  . sodium chloride  3 mL Intravenous Q12H  . sodium chloride  3 mL Intravenous Q12H  . spironolactone  12.5 mg Oral Daily   Continuous Infusions:   Active Problems:   Hypokalemia   H/O noncompliance with medical treatment, presenting hazards to health   Non-ischemic cardiomyopathy - EF 25-30% Sept 2014   Chronic combined systolic and diastolic HF (heart failure), NYHA class 2   CKD (chronic kidney disease), stage III   Stroke   CHF exacerbation   Hypertensive heart disease   Chronic systolic CHF (congestive heart failure)   Acute combined systolic and diastolic congestive heart  failure    Time spent: 10 minutes.    Armya Westerhoff  [facility] Pager [phone]. If 7PM-7AM, please contact night-coverage at [email or web address], password [facility] 05/07/2014, 6:02 PM  LOS: 2 days

## 2014-05-07 NOTE — Progress Notes (Signed)
Pharmacist Heart Failure Core Measure Documentation  Assessment: Devon Ortiz has an EF documented as 30-35% on 06/11/13 by 2D Echo.  Rationale: Heart failure patients with left ventricular systolic dysfunction (LVSD) and an EF < 40% should be prescribed an angiotensin converting enzyme inhibitor (ACEI) or angiotensin receptor blocker (ARB) at discharge unless a contraindication is documented in the medical record.  This patient is not currently on an ACEI or ARB for HF.  This note is being placed in the record in order to provide documentation that a contraindication to the use of these agents is present for this encounter.  ACE Inhibitor or Angiotensin Receptor Blocker is contraindicated (specify all that apply)  []   ACEI allergy AND ARB allergy []   Angioedema []   Moderate or severe aortic stenosis []   Hyperkalemia []   Hypotension []   Renal artery stenosis [x]   Worsening renal function, preexisting renal disease or dysfunction   Devon Ortiz E 05/07/2014 2:41 PM

## 2014-05-08 LAB — GLUCOSE, CAPILLARY
GLUCOSE-CAPILLARY: 143 mg/dL — AB (ref 70–99)
GLUCOSE-CAPILLARY: 147 mg/dL — AB (ref 70–99)
Glucose-Capillary: 103 mg/dL — ABNORMAL HIGH (ref 70–99)
Glucose-Capillary: 116 mg/dL — ABNORMAL HIGH (ref 70–99)

## 2014-05-08 LAB — CBC
HCT: 38.9 % — ABNORMAL LOW (ref 39.0–52.0)
HEMOGLOBIN: 11.1 g/dL — AB (ref 13.0–17.0)
MCH: 20.1 pg — AB (ref 26.0–34.0)
MCHC: 28.5 g/dL — ABNORMAL LOW (ref 30.0–36.0)
MCV: 70.5 fL — AB (ref 78.0–100.0)
Platelets: 257 10*3/uL (ref 150–400)
RBC: 5.52 MIL/uL (ref 4.22–5.81)
RDW: 20.2 % — ABNORMAL HIGH (ref 11.5–15.5)
WBC: 4.1 10*3/uL (ref 4.0–10.5)

## 2014-05-08 LAB — COMPREHENSIVE METABOLIC PANEL
ALT: 17 U/L (ref 0–53)
AST: 18 U/L (ref 0–37)
Albumin: 3.5 g/dL (ref 3.5–5.2)
Alkaline Phosphatase: 79 U/L (ref 39–117)
Anion gap: 11 (ref 5–15)
BILIRUBIN TOTAL: 2.1 mg/dL — AB (ref 0.3–1.2)
BUN: 27 mg/dL — ABNORMAL HIGH (ref 6–23)
CALCIUM: 8.3 mg/dL — AB (ref 8.4–10.5)
CHLORIDE: 101 meq/L (ref 96–112)
CO2: 23 mmol/L (ref 19–32)
Creatinine, Ser: 2.04 mg/dL — ABNORMAL HIGH (ref 0.50–1.35)
GFR calc non Af Amer: 37 mL/min — ABNORMAL LOW (ref >90)
GFR, EST AFRICAN AMERICAN: 43 mL/min — AB (ref >90)
Glucose, Bld: 114 mg/dL — ABNORMAL HIGH (ref 70–99)
Potassium: 3.4 mmol/L — ABNORMAL LOW (ref 3.5–5.1)
SODIUM: 135 mmol/L (ref 135–145)
TOTAL PROTEIN: 6.8 g/dL (ref 6.0–8.3)

## 2014-05-08 LAB — URINE MICROSCOPIC-ADD ON

## 2014-05-08 LAB — URINALYSIS, ROUTINE W REFLEX MICROSCOPIC
Bilirubin Urine: NEGATIVE
Glucose, UA: NEGATIVE mg/dL
Hgb urine dipstick: NEGATIVE
KETONES UR: NEGATIVE mg/dL
Leukocytes, UA: NEGATIVE
Nitrite: NEGATIVE
Protein, ur: 100 mg/dL — AB
Specific Gravity, Urine: 1.02 (ref 1.005–1.030)
Urobilinogen, UA: 0.2 mg/dL (ref 0.0–1.0)
pH: 6 (ref 5.0–8.0)

## 2014-05-08 LAB — MAGNESIUM: MAGNESIUM: 1.9 mg/dL (ref 1.5–2.5)

## 2014-05-08 LAB — PHOSPHORUS: Phosphorus: 3.7 mg/dL (ref 2.3–4.6)

## 2014-05-08 MED ORDER — BISACODYL 5 MG PO TBEC
10.0000 mg | DELAYED_RELEASE_TABLET | Freq: Once | ORAL | Status: AC
  Administered 2014-05-08: 10 mg via ORAL
  Filled 2014-05-08: qty 2

## 2014-05-08 MED ORDER — SODIUM CHLORIDE 0.45 % IV BOLUS
250.0000 mL | Freq: Once | INTRAVENOUS | Status: AC
  Administered 2014-05-08: 250 mL via INTRAVENOUS

## 2014-05-08 NOTE — Progress Notes (Signed)
Patient reported he did not had any urine output since start of the shift last night. Bladder Scan showed 50-34ml. Encourage to drink plenty of water.N.P. Informed. No New orders. Will continue to monitor.

## 2014-05-08 NOTE — Progress Notes (Signed)
According to patient, he has only voided 25 cc since 1500 yesterday. Bladder scan shows only 30 cc in bladder. Dr. Venetia Constable made aware, new orders received. Will continue to monitor patient.

## 2014-05-08 NOTE — Progress Notes (Signed)
  Echocardiogram 2D Echocardiogram has been performed.  Leta Jungling M 05/08/2014, 1:14 PM

## 2014-05-08 NOTE — Progress Notes (Signed)
[facility] PROGRESS NOTE  Amadeus Schanbacher OZY:[id number] DOB: 26-Aug-1965 DOA: 05/05/2014 PCP: Dorrene German, MD  Summary/Subjective: Devon Ortiz is a 50 y.o. 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 The concern was for Acute decompensation of CHF. He still has SOB and says that his urine output is reduced despite lasix. There was question if element of infection as renal function is slightly worse. Ceftriaxone/Zithromax were added yesterday. Renal function is more or less the same with creatinine 2.04 today. 2-D echo is pending. Wonder if he is dry. We'll therefore gave small amount of fluid and reassess pending 2-D Echocardiogram-ordered 250cc normal saline. He says cough is better. We'll continue current management. Likely DC home in a.m. Plan Acute combined systolic and systolic CHF  Reported rapid weight gain, shortness of breath and orthopnea.  Last 2-D echo from February 2015 showed LVEF of 30-35% with grade 2 diastolic dysfunction, repeat echo results pending.  Patient been off of his medication since the beginning of the month, reported he lost his insurance.  Fluid restriction, monitor intake/output, daily weight, restrict salt intake.  Lasix 40 mg a day, continue Aldactone.  Continue home medications including Coreg, Imdur, and hydralazine.  Ceftriaxone/Zithromax day 2 Elevated troponin  Slightly elevated troponin of 0.04, likely secondary to CHF.  Patient denies any chest pain, EKG did not show any acute findings (sinus tachycardia).  Hypokalemia  Replaced with oral and parenteral supplements.  Noncompliance with medical  therapy  Patient reports that he lost his Medicaid, was not able to drink his medications since beginning of the month.  He will need help with meds at d/c. Case management will assist as possible.  CKD stage III  Presented with creatinine of 2.2,now 2.04. Back off Lasix.  UA/UDS. History of a stroke  Patient is on Plavix, continue.  Code Status: Full code  Family Communication: Plan discussed with the patient.  Disposition Plan: Remains inpatient    Consultants:  None  Procedures:  None  Antibiotics:  Ceftriaxone/Zithromax 05/07/13>.  Objective: Filed Vitals:   05/08/14 1429  BP: 152/76  Pulse: 88  Temp: 98.3 F (36.8 C)  Resp: 18    Intake/Output Summary (Last 24 hours) at 05/08/14 1712 Last data filed at 05/08/14 1418  Gross per 24 hour  Intake    410 ml  Output     25 ml  Net    385 ml   Filed Weights   05/06/14 0114 05/07/14 0635 05/08/14 0526  Weight: 117.754 kg (259 lb 9.6 oz) 117.6 kg (259 lb 4.2 oz) 120.112 kg (264 lb 12.8 oz)    Exam:   General:   sitting up not in distress.  Cardiovascular:  first and second heart sounds heard. No murmurs. RRR.  Respiratory: Lungs clear  Abdomen: Soft and nontender. Bowel sounds normal  Musculoskeletal: No pedal edema   Data Reviewed: Basic Metabolic Panel:  Recent Labs Lab 05/05/14 1954 05/05/14 2258 05/06/14 0512 05/07/14 0405 05/08/14 0340  NA 137  --  144 138 135  K 2.8*  --  3.1* 3.6 3.4*  CL 102  --  107 106 101  CO2 25  --  27 23 23   GLUCOSE 112*  --  117* 90 114*  BUN 27*  --  23 24* 27*  CREATININE 2.23*  --  1.82* 2.10* 2.04*  CALCIUM 8.1*  --  8.5 8.3* 8.3*  MG  --  1.8 1.8 1.8 1.9  PHOS  --   --   --  3.3 3.7   Liver Function Tests:  Recent Labs Lab 05/07/14 0405 05/08/14 0340  AST  --  18  ALT  --  17  ALKPHOS  --  79  BILITOT  --  2.1*  PROT  --  6.8  ALBUMIN 3.2* 3.5   No results for input(s): LIPASE, AMYLASE in the last 168 hours. No results for  input(s): AMMONIA in the last 168 hours. CBC:  Recent Labs Lab 05/05/14 1954 05/06/14 0512 05/08/14 0340  WBC 5.3 5.7 4.1  HGB 11.6* 12.2* 11.1*  HCT 40.9 42.1 38.9*  MCV 70.3* 70.6* 70.5*  PLT 248 252 257   Cardiac Enzymes:  Recent Labs Lab 05/06/14 0030 05/06/14 0610 05/06/14 1155  TROPONINI 0.04* 0.04* 0.04*   BNP (last 3 results)  Recent Labs  06/10/13 1740 06/15/13 0519 06/26/13 1238  PROBNP 3962.0* 426.5* 1941.0*   CBG:  Recent Labs Lab 05/07/14 1656 05/07/14 2143 05/08/14 0745 05/08/14 1131 05/08/14 1706  GLUCAP 150* 142* 103* 143* 116*    No results found for this or any previous visit (from the past 240 hour(s)).   Studies: No results found.  Scheduled Meds: . atorvastatin  40 mg Oral q1800  . azithromycin  250 mg Oral QPC supper  . carvedilol  25 mg Oral BID WC  . cefTRIAXone (ROCEPHIN)  IV  1 g Intravenous Q24H  . clopidogrel  75 mg Oral Q breakfast  . furosemide  40 mg Oral Daily  . heparin  5,000 Units Subcutaneous 3 times per day  . hydrALAZINE  50 mg Oral 3 times per day  . insulin aspart  0-15 Units Subcutaneous TID WC  . insulin detemir  5 Units Subcutaneous Q2200  . isosorbide mononitrate  15 mg Oral Daily  . pantoprazole  40 mg Oral Daily  . sodium chloride  3 mL Intravenous Q12H  . sodium chloride  3 mL Intravenous Q12H  . spironolactone  12.5 mg Oral Daily   Continuous Infusions:   Active Problems:   Hypokalemia   H/O noncompliance with medical treatment, presenting hazards to health   Non-ischemic cardiomyopathy - EF 25-30% Sept 2014   Chronic combined systolic and diastolic HF (heart failure), NYHA class 2   CKD (chronic kidney disease), stage III   Stroke   CHF exacerbation   Hypertensive heart disease   Chronic systolic CHF (congestive heart failure)   Acute combined systolic and diastolic congestive heart failure    Time spent: 15 minutes    Lorcan Shelp  [facility] Pager [phone]. If  7PM-7AM, please contact night-coverage at [email or web address], password [facility] 05/08/2014, 5:12 PM  LOS: 3 days

## 2014-05-09 LAB — URINALYSIS, ROUTINE W REFLEX MICROSCOPIC
Bilirubin Urine: NEGATIVE
Glucose, UA: NEGATIVE mg/dL
Hgb urine dipstick: NEGATIVE
Ketones, ur: NEGATIVE mg/dL
LEUKOCYTES UA: NEGATIVE
Nitrite: NEGATIVE
PH: 5 (ref 5.0–8.0)
Protein, ur: 30 mg/dL — AB
Specific Gravity, Urine: 1.025 (ref 1.005–1.030)
Urobilinogen, UA: 0.2 mg/dL (ref 0.0–1.0)

## 2014-05-09 LAB — GLUCOSE, CAPILLARY
GLUCOSE-CAPILLARY: 98 mg/dL (ref 70–99)
GLUCOSE-CAPILLARY: 128 mg/dL — AB (ref 70–99)
Glucose-Capillary: 100 mg/dL — ABNORMAL HIGH (ref 70–99)
Glucose-Capillary: 150 mg/dL — ABNORMAL HIGH (ref 70–99)

## 2014-05-09 LAB — CBC
HCT: 38.2 % — ABNORMAL LOW (ref 39.0–52.0)
Hemoglobin: 11 g/dL — ABNORMAL LOW (ref 13.0–17.0)
MCH: 20.3 pg — AB (ref 26.0–34.0)
MCHC: 28.8 g/dL — ABNORMAL LOW (ref 30.0–36.0)
MCV: 70.3 fL — ABNORMAL LOW (ref 78.0–100.0)
Platelets: 231 10*3/uL (ref 150–400)
RBC: 5.43 MIL/uL (ref 4.22–5.81)
RDW: 20.5 % — ABNORMAL HIGH (ref 11.5–15.5)
WBC: 4.1 10*3/uL (ref 4.0–10.5)

## 2014-05-09 LAB — URINE MICROSCOPIC-ADD ON

## 2014-05-09 LAB — BASIC METABOLIC PANEL
ANION GAP: 9 (ref 5–15)
BUN: 30 mg/dL — ABNORMAL HIGH (ref 6–23)
CALCIUM: 8.6 mg/dL (ref 8.4–10.5)
CHLORIDE: 104 meq/L (ref 96–112)
CO2: 23 mmol/L (ref 19–32)
Creatinine, Ser: 2.09 mg/dL — ABNORMAL HIGH (ref 0.50–1.35)
GFR calc non Af Amer: 36 mL/min — ABNORMAL LOW (ref >90)
GFR, EST AFRICAN AMERICAN: 42 mL/min — AB (ref >90)
Glucose, Bld: 109 mg/dL — ABNORMAL HIGH (ref 70–99)
POTASSIUM: 3.5 mmol/L (ref 3.5–5.1)
SODIUM: 136 mmol/L (ref 135–145)

## 2014-05-09 LAB — RAPID URINE DRUG SCREEN, HOSP PERFORMED
Amphetamines: NOT DETECTED
Barbiturates: NOT DETECTED
Benzodiazepines: NOT DETECTED
Cocaine: NOT DETECTED
OPIATES: NOT DETECTED
Tetrahydrocannabinol: NOT DETECTED

## 2014-05-09 MED ORDER — FUROSEMIDE 10 MG/ML IJ SOLN
40.0000 mg | Freq: Once | INTRAMUSCULAR | Status: AC
  Administered 2014-05-09: 40 mg via INTRAVENOUS
  Filled 2014-05-09: qty 4

## 2014-05-09 MED ORDER — FUROSEMIDE 10 MG/ML IJ SOLN
40.0000 mg | Freq: Every day | INTRAMUSCULAR | Status: DC
  Administered 2014-05-10 – 2014-05-11 (×2): 40 mg via INTRAVENOUS
  Filled 2014-05-09 (×2): qty 4

## 2014-05-09 NOTE — Progress Notes (Signed)
CARE MANAGEMENT NOTE 05/09/2014  Patient:  Devon Ortiz, Devon Ortiz   Account Number:  [id number]  Date Initiated:  05/06/2014  Documentation initiated by:  Trinna Balloon  Subjective/Objective Assessment:   pt admitted with cco SOB, cough, DOE     Action/Plan:   from home   Anticipated DC Date:  05/08/2014   Anticipated DC Plan:  HOME/SELF CARE      DC Planning Services  CM consult  Follow-up appt scheduled  GCCN / P4HM (established/new)      Choice offered to / List presented to:  C-1 Patient           Status of service:  In process, will continue to follow Medicare Important Message given?   (If response is "NO", the following Medicare IM given date fields will be blank) Date Medicare IM given:   Medicare IM given by:   Date Additional Medicare IM given:   Additional Medicare IM given by:    Discharge Disposition:    Per UR Regulation:  Reviewed for med. necessity/level of care/duration of stay  If discussed at Long Length of Stay Meetings, dates discussed:    Comments:  05/09/14 MMcGibboney, RN, BSN Spoke with pt concerning PCP. Pt given an appointment with [facility], 05/19/14 at 3:30 PM.  05/06/14 MMcGibboney, RN, BSN Chart reviewed. Verifying insurance coverage.

## 2014-05-09 NOTE — Progress Notes (Signed)
[facility] PROGRESS NOTE  Devon Ortiz ZOX:[id number] DOB: 07-09-1965 DOA: 05/05/2014 PCP: Dorrene German, MD  Assessment/Plan: Noted EF of 15% from latest 2-D echocardiogram. He reports reduced urinary output. Shortness of breath not significantly changed. We'll need to address compliance but lack of insurance will be an impedement. We'll give IV Lasix and consult cardiology in a.m. Plan  Acute combined systolic and systolic CHF  Reported rapid weight gain, shortness of breath and orthopnea.  Last 2-D echo from February 2015 showed LVEF of 30-35% with grade 2 diastolic dysfunction, repeat echo results show EF 15% with global hypokinesis.  Patient been off of his medication since the beginning of the month, reported he lost his insurance.  Fluid restriction, monitor intake/output, daily weight, restrict salt intake.  Change Lasix to intravenous, continue Aldactone.  Continue home medications including Coreg, Imdur, and hydralazine. Patient not candidate for ARB/ACE inhibitor in view of progressive renal failure Ceftriaxone/Zithromax day 3 Elevated troponin  Slightly elevated troponin of 0.04, likely secondary to CHF.  Patient denies any chest pain, EKG did not show any acute findings (sinus tachycardia).  Hypokalemia  Replaced with oral and parenteral supplements.  Noncompliance with medical therapy  Patient reports that he lost his Medicaid, was not able to drink his medications since beginning of the month.  He will need help with meds at d/c. Case management will assist as possible.  CKD stage III  Presented with creatinine of 2.2,now 2.09.  UA/UDS unremarkable.  History of a stroke  Patient is on Plavix, continue.  Code Status: Full code  Family Communication: Plan discussed with the patient.  Disposition Plan: Remains inpatient  Consultants:  Cardiology in a.m. Procedures:  None Antibiotics:  Ceftriaxone/Zithromax 05/07/13>.  Objective: Filed Vitals:   05/09/14  2132  BP: 107/69  Pulse: 70  Temp: 97.6 F (36.4 C)  Resp: 18    Intake/Output Summary (Last 24 hours) at 05/09/14 2145 Last data filed at 05/09/14 1900  Gross per 24 hour  Intake    480 ml  Output    550 ml  Net    -70 ml   Filed Weights   05/07/14 0635 05/08/14 0526 05/09/14 0500  Weight: 117.6 kg (259 lb 4.2 oz) 120.112 kg (264 lb 12.8 oz) 121.383 kg (267 lb 9.6 oz)    Exam:   General:  Sitting up. Not in distress.  Cardiovascular: S1S2 heard. No murmurs. RRR.  Respiratory: Lungs clear  Abdomen: Soft and nontender  Musculoskeletal: No pedal edema   Data Reviewed: Basic Metabolic Panel:  Recent Labs Lab 05/05/14 1954 05/05/14 2258 05/06/14 0512 05/07/14 0405 05/08/14 0340 05/09/14 0433  NA 137  --  144 138 135 136  K 2.8*  --  3.1* 3.6 3.4* 3.5  CL 102  --  107 106 101 104  CO2 25  --  GLUCOSE 112*  --  117* 90 114* 109*  BUN 27*  --  23 24* 27* 30*  CREATININE 2.23*  --  1.82* 2.10* 2.04* 2.09*  CALCIUM 8.1*  --  8.5 8.3* 8.3* 8.6  MG  --  1.8 1.8 1.8 1.9  --   PHOS  --   --   --  3.3 3.7  --    Liver Function Tests:  Recent Labs Lab 05/07/14 0405 05/08/14 0340  AST  --  18  ALT  --  17  ALKPHOS  --  79  BILITOT  --  2.1*  PROT  --  6.8  ALBUMIN 3.2* 3.5   No results for input(s): LIPASE, AMYLASE in the last 168 hours. No results for input(s): AMMONIA in the last 168 hours. CBC:  Recent Labs Lab 05/05/14 1954 05/06/14 0512 05/08/14 0340 05/09/14 0433  WBC 5.3 5.7 4.1 4.1  HGB 11.6* 12.2* 11.1* 11.0*  HCT 40.9 42.1 38.9* 38.2*  MCV 70.3* 70.6* 70.5* 70.3*  PLT 248 252 257 231   Cardiac Enzymes:  Recent Labs Lab 05/06/14 0030 05/06/14 0610 05/06/14 1155  TROPONINI 0.04* 0.04* 0.04*   BNP (last 3 results)  Recent Labs  06/10/13 1740 06/15/13 0519 06/26/13 1238  PROBNP 3962.0* 426.5* 1941.0*   CBG:  Recent Labs Lab 05/08/14 1706 05/08/14 2047 05/09/14 0753 05/09/14 1156 05/09/14 1705  GLUCAP  116* 147* 98 100* 128*    No results found for this or any previous visit (from the past 240 hour(s)).   Studies: No results found.  Scheduled Meds: . atorvastatin  40 mg Oral q1800  . azithromycin  250 mg Oral QPC supper  . carvedilol  25 mg Oral BID WC  . cefTRIAXone (ROCEPHIN)  IV  1 g Intravenous Q24H  . clopidogrel  75 mg Oral Q breakfast  . [START ON 05/10/2014] furosemide  40 mg Intravenous Daily  . heparin  5,000 Units Subcutaneous 3 times per day  . hydrALAZINE  50 mg Oral 3 times per day  . insulin aspart  0-15 Units Subcutaneous TID WC  . insulin detemir  5 Units Subcutaneous Q2200  . isosorbide mononitrate  15 mg Oral Daily  . pantoprazole  40 mg Oral Daily  . sodium chloride  3 mL Intravenous Q12H  . sodium chloride  3 mL Intravenous Q12H  . spironolactone  12.5 mg Oral Daily   Continuous Infusions:   Active Problems:   Hypokalemia   H/O noncompliance with medical treatment, presenting hazards to health   Non-ischemic cardiomyopathy - EF 25-30% Sept 2014   Chronic combined systolic and diastolic HF (heart failure), NYHA class 2   CKD (chronic kidney disease), stage III   Stroke   CHF exacerbation   Hypertensive heart disease   Chronic systolic CHF (congestive heart failure)   Acute combined systolic and diastolic congestive heart failure    Time spent: 20 minutes    Lucilla Petrenko  [facility] Pager [phone]. If 7PM-7AM, please contact night-coverage at [email or web address], password [facility] 05/09/2014, 9:45 PM  LOS: 4 days

## 2014-05-09 NOTE — Progress Notes (Signed)
Pt has not voided all night, bladder scanned pt. noted in bladder. Pt complains of no discomfort or pressure in lower suprapubic area. Will continue to monitor

## 2014-05-10 DIAGNOSIS — I5042 Chronic combined systolic (congestive) and diastolic (congestive) heart failure: Secondary | ICD-10-CM

## 2014-05-10 LAB — COMPREHENSIVE METABOLIC PANEL
ALT: 21 U/L (ref 0–53)
AST: 21 U/L (ref 0–37)
Albumin: 3.4 g/dL — ABNORMAL LOW (ref 3.5–5.2)
Alkaline Phosphatase: 85 U/L (ref 39–117)
Anion gap: 8 (ref 5–15)
BUN: 31 mg/dL — AB (ref 6–23)
CO2: 24 mmol/L (ref 19–32)
Calcium: 8.7 mg/dL (ref 8.4–10.5)
Chloride: 107 mEq/L (ref 96–112)
Creatinine, Ser: 2.19 mg/dL — ABNORMAL HIGH (ref 0.50–1.35)
GFR calc Af Amer: 39 mL/min — ABNORMAL LOW (ref >90)
GFR calc non Af Amer: 34 mL/min — ABNORMAL LOW (ref >90)
GLUCOSE: 121 mg/dL — AB (ref 70–99)
POTASSIUM: 3.7 mmol/L (ref 3.5–5.1)
Sodium: 139 mmol/L (ref 135–145)
TOTAL PROTEIN: 6.5 g/dL (ref 6.0–8.3)
Total Bilirubin: 1.6 mg/dL — ABNORMAL HIGH (ref 0.3–1.2)

## 2014-05-10 LAB — GLUCOSE, CAPILLARY
GLUCOSE-CAPILLARY: 145 mg/dL — AB (ref 70–99)
Glucose-Capillary: 94 mg/dL (ref 70–99)
Glucose-Capillary: 121 mg/dL — ABNORMAL HIGH (ref 70–99)
Glucose-Capillary: 118 mg/dL — ABNORMAL HIGH (ref 70–99)

## 2014-05-10 LAB — MAGNESIUM: Magnesium: 2 mg/dL (ref 1.5–2.5)

## 2014-05-10 MED ORDER — FUROSEMIDE 10 MG/ML IJ SOLN
20.0000 mg | Freq: Once | INTRAMUSCULAR | Status: AC
  Administered 2014-05-10: 20 mg via INTRAVENOUS
  Filled 2014-05-10: qty 2
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
## 2014-05-10 NOTE — Progress Notes (Signed)
[facility] PROGRESS NOTE  Devon Ortiz WGN:[id number] DOB: 1965/12/28 DOA: 05/05/2014 PCP: Dorrene German, MD  Summary/subjective Appreciate cardiology. Devon Ortiz is a 49 y.o. male with history of nonischemic cardiac disease with EF of 25-30%, combined systolic and diastolic heart failure(since decreased to 15% according to 2-D echocardiogram done during this admission), hypertension, diabetes mellitus, and history of noncompliance with medications who presented to the hospital after being off of his home medication regimen for 2 weeks and he had complaints of worsening edema, productive cough(yellow sputum), and worsening dyspnea on exertion . The concern was for Acute decompensation of CHF. He was placed on Lasix and try off antibiotics before 2-D echo results. At this point there is no evidence of infection. We'll therefore discontinue antibiotics, continue Lasix IV and rest of medications per cardiology recommendations. He needs appropriate follow-up and needs arrangements to get medications given insurance challenges. He complains of being short winded still with ambulation.  Plan  Acute combined systolic and systolic CHF  Reported rapid weight gain, shortness of breath and orthopnea.  Last 2-D echo from February 2015 showed LVEF of 30-35% with grade 2 diastolic dysfunction, repeat echo results show EF 15% with global hypokinesis.  Patient been off of his medication since the beginning of the month, reported he lost his insurance.  Fluid restriction, monitor intake/output, daily weight, restrict salt intake.  Contnue Lasix  intravenous, continue Aldactone. Monitor electrolytes. Check BMP in am Continue home medications including Coreg, Imdur, and hydralazine. Patient not candidate for ARB/ACE inhibitor in view of progressive renal failure Follow cardiology recommendations Elevated troponin  Slightly elevated troponin of 0.04, likely secondary to CHF.  Patient denies any  chest pain, EKG did not show any acute findings (sinus tachycardia).  Hypokalemia  Replaced with oral and parenteral supplements.  Noncompliance with medical therapy  Patient reports that he lost his Medicaid, was not able to drink his medications since beginning of the month.  He will need help with meds at d/c. Case management will assist as possible.  CKD stage III  Presented with creatinine of 2.2,now 2.19.  UA/UDS unremarkable.  History of a stroke  Patient is on Plavix, continue.  Code Status: Full code  Family Communication: Plan discussed with the patient.  Disposition Plan: Remains inpatient  Consultants:   Cardiology . Procedures:   None Antibiotics:  Ceftriaxone/Zithromax 05/07/13>05/10/14. Objective: Filed Vitals:   05/10/14 1616  BP: 111/74  Pulse: 77  Temp: 97.8 F (36.6 C)  Resp: 20    Intake/Output Summary (Last 24 hours) at 05/10/14 1815 Last data filed at 05/10/14 1618  Gross per 24 hour  Intake    650 ml  Output   2125 ml  Net  -1475 ml   Filed Weights   05/08/14 0526 05/09/14 0500 05/10/14 0558  Weight: 120.112 kg (264 lb 12.8 oz) 121.383 kg (267 lb 9.6 oz) 121.609 kg (268 lb 1.6 oz)    Exam:   General:  Sitting up, not indistress  Cardiovascular: RRR. No murmurs. S1S2 heard.  Respiratory: Good air entry bilaterally.  Abdomen: soft and nontender. +BC.  Musculoskeletal: No pedal edema.   Data Reviewed: Basic Metabolic Panel:  Recent Labs Lab 05/05/14 2258 05/06/14 0512 05/07/14 0405 05/08/14 0340 05/09/14 0433 05/10/14 0442  NA  --  144 138 135 136 139  K  --  3.1* 3.6 3.4* 3.5 3.7  CL  --  107 106 101 104 107  CO2  --  GLUCOSE  --  117* 90 114* 109* 121*  BUN  --  23 24* 27* 30* 31*  CREATININE  --  1.82* 2.10* 2.04* 2.09* 2.19*  CALCIUM  --  8.5 8.3* 8.3* 8.6 8.7  MG 1.8 1.8 1.8 1.9  --  2.0  PHOS  --   --  3.3 3.7  --   --    Liver Function Tests:  Recent Labs Lab 05/07/14 0405  05/08/14 0340 05/10/14 0442  AST  --  18 21  ALT  --  17 21  ALKPHOS  --  79 85  BILITOT  --  2.1* 1.6*  PROT  --  6.8 6.5  ALBUMIN 3.2* 3.5 3.4*   No results for input(s): LIPASE, AMYLASE in the last 168 hours. No results for input(s): AMMONIA in the last 168 hours. CBC:  Recent Labs Lab 05/05/14 1954 05/06/14 0512 05/08/14 0340 05/09/14 0433  WBC 5.3 5.7 4.1 4.1  HGB 11.6* 12.2* 11.1* 11.0*  HCT 40.9 42.1 38.9* 38.2*  MCV 70.3* 70.6* 70.5* 70.3*  PLT 248 252 257 231   Cardiac Enzymes:  Recent Labs Lab 05/06/14 0030 05/06/14 0610 05/06/14 1155  TROPONINI 0.04* 0.04* 0.04*   BNP (last 3 results)  Recent Labs  06/10/13 1740 06/15/13 0519 06/26/13 1238  PROBNP 3962.0* 426.5* 1941.0*   CBG:  Recent Labs Lab 05/09/14 1705 05/09/14 2127 05/10/14 0806 05/10/14 1158 05/10/14 1744  GLUCAP 128* 150* 94 121* 118*    No results found for this or any previous visit (from the past 240 hour(s)).   Studies: No results found.  Scheduled Meds: . atorvastatin  40 mg Oral q1800  . carvedilol  25 mg Oral BID WC  . clopidogrel  75 mg Oral Q breakfast  . furosemide  20 mg Intravenous Once  . furosemide  40 mg Intravenous Daily  . heparin  5,000 Units Subcutaneous 3 times per day  . hydrALAZINE  50 mg Oral 3 times per day  . insulin aspart  0-15 Units Subcutaneous TID WC  . insulin detemir  5 Units Subcutaneous Q2200  . isosorbide mononitrate  15 mg Oral Daily  . pantoprazole  40 mg Oral Daily  . sodium chloride  3 mL Intravenous Q12H  . sodium chloride  3 mL Intravenous Q12H  . spironolactone  12.5 mg Oral Daily   Continuous Infusions:   Active Problems:   Hypokalemia   H/O noncompliance with medical treatment, presenting hazards to health   Non-ischemic cardiomyopathy - EF 25-30% Sept 2014   Chronic combined systolic and diastolic HF (heart failure), NYHA class 2   CKD (chronic kidney disease), stage III   Stroke   Hypertensive heart  disease    Time spent: 20 minutes    Renea Schoonmaker  [facility] Pager [phone]. If 7PM-7AM, please contact night-coverage at [email or web address], password [facility] 05/10/2014, 6:15 PM  LOS: 5 days

## 2014-05-11 LAB — BASIC METABOLIC PANEL
Anion gap: 12 (ref 5–15)
BUN: 30 mg/dL — ABNORMAL HIGH (ref 6–23)
CO2: 23 mmol/L (ref 19–32)
CREATININE: 2.11 mg/dL — AB (ref 0.50–1.35)
Calcium: 8.8 mg/dL (ref 8.4–10.5)
Chloride: 102 mEq/L (ref 96–112)
GFR, EST AFRICAN AMERICAN: 41 mL/min — AB (ref >90)
GFR, EST NON AFRICAN AMERICAN: 36 mL/min — AB (ref >90)
GLUCOSE: 96 mg/dL (ref 70–99)
POTASSIUM: 3.6 mmol/L (ref 3.5–5.1)
Sodium: 137 mmol/L (ref 135–145)

## 2014-05-11 LAB — BRAIN NATRIURETIC PEPTIDE: B NATRIURETIC PEPTIDE 5: 464.2 pg/mL — AB (ref 0.0–100.0)

## 2014-05-11 LAB — GLUCOSE, CAPILLARY
GLUCOSE-CAPILLARY: 102 mg/dL — AB (ref 70–99)
Glucose-Capillary: 92 mg/dL (ref 70–99)
Glucose-Capillary: 133 mg/dL — ABNORMAL HIGH (ref 70–99)
Glucose-Capillary: 104 mg/dL — ABNORMAL HIGH (ref 70–99)

## 2014-05-11 MED ORDER — FUROSEMIDE 10 MG/ML IJ SOLN
20.0000 mg | Freq: Once | INTRAMUSCULAR | Status: AC
  Administered 2014-05-11: 20 mg via INTRAVENOUS
  Filled 2014-05-11: qty 2

## 2014-05-11 NOTE — Progress Notes (Signed)
Subjective:  He says that his breathing is better.  Has had a lot of difficulties socially with being denied for disability, difficulty paying medicines and taking medicines.  Objective:  Vital Signs in the last 24 hours: BP 120/89 mmHg  Pulse 75  Temp(Src) 97.9 F (36.6 C) (Oral)  Resp 18  Ht 5\' 8"  (1.727 m)  Wt 121.609 kg (268 lb 1.6 oz)  BMI 40.77 kg/m2  SpO2 94%  Physical Exam: Pleasant, polite, very obese black male in no acute distress Lungs:  Clear Cardiac:  Regular rhythm, normal S1 and S2, soft S3 Extremities: 1+ edema present  Intake/Output from previous day: 01/16 0701 - 01/17 0700 In: 600 [P.O.:600] Out: 2250 [Urine:2250]  Weight Filed Weights   05/08/14 0526 05/09/14 0500 05/10/14 0558  Weight: 120.112 kg (264 lb 12.8 oz) 121.383 kg (267 lb 9.6 oz) 121.609 kg (268 lb 1.6 oz)    Lab Results: Basic Metabolic Panel:  Recent Labs  93/26/71 0442 05/11/14 0511  NA 139 137  K 3.7 3.6  CL 107 102  CO2 24 23  GLUCOSE 121* 96  BUN 31* 30*  CREATININE 2.19* 2.11*   CBC:  Recent Labs  05/09/14 0433  WBC 4.1  HGB 11.0*  HCT 38.2*  MCV 70.3*  PLT 231   Telemetry: Normal sinus rhythm with occasional PVCs  Assessment/Plan:  1.  Nonischemic cardiomyopathy with worsening since previous echo in February one year ago, may be to lack of medical compliance. 2.  Stage 3-4 chronic kidney disease. 3.  Hypertensive heart disease. 4.  Obesity. 5.  Significant social and financial issues.  Recommendations:  We'll have advanced heart failure get back in touch with him on rounds tomorrow.  Since he lives in [place].  We'll need to coordinate trying to get care over there.  At his age with deterioration in LV function of this continues, may need to be considered for advanced therapies.     Darden Palmer  MD [facility] Cardiology  05/11/2014, 8:50 AM

## 2014-05-11 NOTE — Progress Notes (Signed)
[facility] PROGRESS NOTE  Drake Wuertz WUJ:[id number] DOB: 1965/12/16 DOA: 05/05/2014 PCP: Dorrene German, MD  Summary/subjective Appreciate cardiology. Spyros Winch is a 49 y.o. male with history of nonischemic cardiac disease with EF of 25-30%, combined systolic and diastolic heart failure(since decreased to 15% according to 2-D echocardiogram done during this admission), hypertension, diabetes mellitus, and history of noncompliance with medications who presented to the hospital after being off of his home medication regimen for 2 weeks and he had complaints of worsening edema, productive cough(yellow sputum), and worsening dyspnea on exertion . The concern was for Acute decompensation of CHF. He was placed on Lasix and try off antibiotics before 2-D echo results. At this point there is no evidence of infection. We'll therefore discontinue antibiotics, continue Lasix IV and rest of medications per cardiology recommendations. He needs appropriate follow-up and needs arrangements to get medications given insurance challenges. He reports that he peed a lot today. He is less dyspneic. Plan  Acute combined systolic and systolic CHF  Reported rapid weight gain, shortness of breath and orthopnea.  Last 2-D echo from February 2015 showed LVEF of 30-35% with grade 2 diastolic dysfunction, repeat echo results show EF 15% with global hypokinesis.  Patient been off of his medication since the beginning of the month, reported he lost his insurance.  Fluid restriction, monitor intake/output, daily weight, restrict salt intake.  Contnue Lasix intravenous, continue Aldactone. Give another dose of Lasix IV. Check BMP in am Continue home medications including Coreg, Imdur, and hydralazine. Patient not candidate for ARB/ACE inhibitor in view of progressive renal failure Follow cardiology recommendations Elevated troponin  Slightly elevated troponin of 0.04, likely secondary to CHF.  Patient denies  any chest pain, EKG did not show any acute findings (sinus tachycardia).  Hypokalemia  Replaced with oral and parenteral supplements.  Noncompliance with medical therapy  Patient reports that he lost his Medicaid, was not able to drink his medications since beginning of the month.  He will need help with meds at d/c. Case management will assist as possible.  CKD stage III  Presented with creatinine of 2.2,now 2.19.  UA/UDS unremarkable.  History of a stroke  Patient is on Plavix, continue.  Code Status: Full code  Family Communication: Plan discussed with the patient.  Disposition Plan: Remains inpatient  Consultants:   Cardiology . Procedures:   None Antibiotics:   Ceftriaxone/Zithromax 05/07/13>05/10/14. Objective: Filed Vitals:   05/11/14 1500  BP: 118/74  Pulse: 73  Temp: 97.9 F (36.6 C)  Resp: 18    Intake/Output Summary (Last 24 hours) at 05/11/14 1827 Last data filed at 05/11/14 1500  Gross per 24 hour  Intake    480 ml  Output   2450 ml  Net  -1970 ml   Filed Weights   05/08/14 0526 05/09/14 0500 05/10/14 0558  Weight: 120.112 kg (264 lb 12.8 oz) 121.383 kg (267 lb 9.6 oz) 121.609 kg (268 lb 1.6 oz)    Exam:   General:  Sitting up not in distress.  Cardiovascular: First and second heart sounds head. No murmurs.  Respiratory: Lungs clear.  Abdomen: Soft and nontender. Normal bowel sounds  Musculoskeletal: No pedal edema   Data Reviewed: Basic Metabolic Panel:  Recent Labs Lab 05/05/14 2258 05/06/14 0512 05/07/14 0405 05/08/14 0340 05/09/14 0433 05/10/14 0442 05/11/14 0511  NA  --  144 138 135 136 139 137  K  --  3.1* 3.6 3.4* 3.5 3.7 3.6  CL  --  107 106 101 104 107  102  CO2  --  27 23 23 23 24 23   GLUCOSE  --  117* 90 114* 109* 121* 96  BUN  --  23 24* 27* 30* 31* 30*  CREATININE  --  1.82* 2.10* 2.04* 2.09* 2.19* 2.11*  CALCIUM  --  8.5 8.3* 8.3* 8.6 8.7 8.8  MG 1.8 1.8 1.8 1.9  --  2.0  --   PHOS  --   --  3.3 3.7   --   --   --    Liver Function Tests:  Recent Labs Lab 05/07/14 0405 05/08/14 0340 05/10/14 0442  AST  --  18 21  ALT  --  17 21  ALKPHOS  --  79 85  BILITOT  --  2.1* 1.6*  PROT  --  6.8 6.5  ALBUMIN 3.2* 3.5 3.4*   No results for input(s): LIPASE, AMYLASE in the last 168 hours. No results for input(s): AMMONIA in the last 168 hours. CBC:  Recent Labs Lab 05/05/14 1954 05/06/14 0512 05/08/14 0340 05/09/14 0433  WBC 5.3 5.7 4.1 4.1  HGB 11.6* 12.2* 11.1* 11.0*  HCT 40.9 42.1 38.9* 38.2*  MCV 70.3* 70.6* 70.5* 70.3*  PLT 248 252 257 231   Cardiac Enzymes:  Recent Labs Lab 05/06/14 0030 05/06/14 0610 05/06/14 1155  TROPONINI 0.04* 0.04* 0.04*   BNP (last 3 results)  Recent Labs  06/10/13 1740 06/15/13 0519 06/26/13 1238  PROBNP 3962.0* 426.5* 1941.0*   CBG:  Recent Labs Lab 05/10/14 1744 05/10/14 2119 05/11/14 0759 05/11/14 1141 05/11/14 1717  GLUCAP 118* 145* 92 102* 133*    No results found for this or any previous visit (from the past 240 hour(s)).   Studies: No results found.  Scheduled Meds: . atorvastatin  40 mg Oral q1800  . carvedilol  25 mg Oral BID WC  . clopidogrel  75 mg Oral Q breakfast  . furosemide  20 mg Intravenous Once  . furosemide  40 mg Intravenous Daily  . heparin  5,000 Units Subcutaneous 3 times per day  . hydrALAZINE  50 mg Oral 3 times per day  . insulin aspart  0-15 Units Subcutaneous TID WC  . insulin detemir  5 Units Subcutaneous Q2200  . isosorbide mononitrate  15 mg Oral Daily  . pantoprazole  40 mg Oral Daily  . sodium chloride  3 mL Intravenous Q12H  . sodium chloride  3 mL Intravenous Q12H  . spironolactone  12.5 mg Oral Daily   Continuous Infusions:   Active Problems:   Hypokalemia   H/O noncompliance with medical treatment, presenting hazards to health   Non-ischemic cardiomyopathy - EF 25-30% Sept 2014   Chronic combined systolic and diastolic HF (heart failure), NYHA class 2   CKD (chronic  kidney disease), stage III   Stroke   Hypertensive heart disease    Time spent: 15 minutes    Chidiebere Wynn  [facility] Pager [phone]. If 7PM-7AM, please contact night-coverage at [email or web address], password [facility] 05/11/2014, 6:27 PM  LOS: 6 days

## 2014-05-12 DIAGNOSIS — I509 Heart failure, unspecified: Secondary | ICD-10-CM

## 2014-05-12 DIAGNOSIS — I110 Hypertensive heart disease with heart failure: Secondary | ICD-10-CM

## 2014-05-12 DIAGNOSIS — I5043 Acute on chronic combined systolic (congestive) and diastolic (congestive) heart failure: Secondary | ICD-10-CM

## 2014-05-12 DIAGNOSIS — Z9119 Patient's noncompliance with other medical treatment and regimen: Secondary | ICD-10-CM

## 2014-05-12 LAB — COMPREHENSIVE METABOLIC PANEL
ALT: 22 U/L (ref 0–53)
ANION GAP: 9 (ref 5–15)
AST: 23 U/L (ref 0–37)
Albumin: 3.4 g/dL — ABNORMAL LOW (ref 3.5–5.2)
Alkaline Phosphatase: 91 U/L (ref 39–117)
BILIRUBIN TOTAL: 1.7 mg/dL — AB (ref 0.3–1.2)
BUN: 30 mg/dL — ABNORMAL HIGH (ref 6–23)
CALCIUM: 8.7 mg/dL (ref 8.4–10.5)
CO2: 24 mmol/L (ref 19–32)
CREATININE: 2.06 mg/dL — AB (ref 0.50–1.35)
Chloride: 106 mEq/L (ref 96–112)
GFR calc Af Amer: 43 mL/min — ABNORMAL LOW (ref >90)
GFR, EST NON AFRICAN AMERICAN: 37 mL/min — AB (ref >90)
GLUCOSE: 103 mg/dL — AB (ref 70–99)
POTASSIUM: 3.7 mmol/L (ref 3.5–5.1)
SODIUM: 139 mmol/L (ref 135–145)
Total Protein: 6.4 g/dL (ref 6.0–8.3)

## 2014-05-12 LAB — GLUCOSE, CAPILLARY
GLUCOSE-CAPILLARY: 91 mg/dL (ref 70–99)
GLUCOSE-CAPILLARY: 187 mg/dL — AB (ref 70–99)
Glucose-Capillary: 110 mg/dL — ABNORMAL HIGH (ref 70–99)
Glucose-Capillary: 108 mg/dL — ABNORMAL HIGH (ref 70–99)

## 2014-05-12 MED ORDER — FUROSEMIDE 10 MG/ML IJ SOLN
40.0000 mg | Freq: Two times a day (BID) | INTRAMUSCULAR | Status: DC
  Administered 2014-05-12: 40 mg via INTRAVENOUS
  Filled 2014-05-12: qty 4

## 2014-05-12 NOTE — Progress Notes (Signed)
Subjective:  He says that his breathing is better.  Has had a lot of difficulties socially with being denied for disability, difficulty paying medicines and taking medicines. Still notes swelling in abdomen although lower extremity swelling improved.   Objective:  Vital Signs in the last 24 hours: BP 127/80 mmHg  Pulse 70  Temp(Src) 97.8 F (36.6 C) (Oral)  Resp 18  Ht 5\' 8"  (1.727 m)  Wt 268 lb 1.6 oz (121.609 kg)  BMI 40.77 kg/m2  SpO2 97%  Physical Exam: Pleasant,  obese black male in no acute distress Lungs:  Clear Cardiac:  Regular rhythm, normal S1 and S2, soft S3 Abdomen: obese. Soft, NT. No definite ascites. Extremities: 1+ edema present  Intake/Output from previous day: 01/17 0701 - 01/18 0700 In: 480 [P.O.:480] Out: 1200 [Urine:1200]  Weight Filed Weights   05/08/14 0526 05/09/14 0500 05/10/14 0558  Weight: 264 lb 12.8 oz (120.112 kg) 267 lb 9.6 oz (121.383 kg) 268 lb 1.6 oz (121.609 kg)    Lab Results: Basic Metabolic Panel:  Recent Labs  67/54/49 0511 05/12/14 0439  NA 137 139  K 3.6 3.7  CL 102 106  CO2 23 24  GLUCOSE 96 103*  BUN 30* 30*  CREATININE 2.11* 2.06*   CBC: No results for input(s): WBC, NEUTROABS, HGB, HCT, MCV, PLT in the last 72 hours. Telemetry: Normal sinus rhythm with occasional PVCs, 4 beat run NSVT  Assessment/Plan:  1.  Nonischemic cardiomyopathy with worsening since previous echo in February one year ago, now EF 15%.  ? Due to lack of medical compliance. 2.  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  Hypertensive heart disease. 4.  Obesity. 5.  Significant social and financial issues.  Recommendations:  Will continue IV lasix today. He is still volume overloaded. he has diuresed 4 liters since admission but weight still going up.  Continue aldactone, hydralazine, nitrates, and Coreg. Since he lives in [place]. We'll need to coordinate getting follow up cardiology care there.  At his age with deterioration in  LV function of this continues, may need to be considered for advanced therapies.    Peter [place]azil[place] MD, [facility]    05/12/2014, 7:59 AM

## 2014-05-12 NOTE — Progress Notes (Signed)
[facility] PROGRESS NOTE  Devon Ortiz AES:[id number] DOB: 05-Jan-1966 DOA: 05/05/2014 PCP: Dorrene German, MD  Summary/subjective Appreciate cardiology. Devon Ortiz is a 49 y.o. male with history of nonischemic cardiac disease with EF of 25-30%, combined systolic and diastolic heart failure(since decreased to 15% according to 2-D echocardiogram done during this admission), hypertension, diabetes mellitus, and history of noncompliance with medications who presented to the hospital after being off of his home medication regimen for 2 weeks and he had complaints of worsening edema, productive cough(yellow sputum), and worsening dyspnea on exertion . The concern was for Acute decompensation of CHF. He was placed on Lasix and try off antibiotics before 2-D echo results. At this point there is no evidence of infection. We'll therefore discontinue antibiotics, continue Lasix IV and rest of medications per cardiology recommendations. He needs appropriate follow-up and needs arrangements to get medications given insurance challenges. He reports that he peed a lot today. He is less dyspneic. Plan  Acute combined systolic and systolic CHF  Reported rapid weight gain, shortness of breath and orthopnea.  Last 2-D echo from February 2015 showed LVEF of 30-35% with grade 2 diastolic dysfunction, repeat echo results show EF 15% with global hypokinesis.  Patient been off of his medication since the beginning of the month, reported he lost his insurance.  Fluid restriction, monitor intake/output, daily weight, restrict salt intake.  Contnue Lasix intravenous per cardiology, continue Aldactone.  Check BMP in am Continue home medications including Coreg, Imdur, and hydralazine. Patient not candidate for ARB/ACE inhibitor in view of progressive renal failure Follow cardiology recommendations Elevated troponin  Slightly elevated troponin of 0.04, likely secondary to CHF.  Patient denies any chest  pain, EKG did not show any acute findings (sinus tachycardia).  Hypokalemia  Replaced with oral and parenteral supplements.  Noncompliance with medical therapy  Patient reports that he lost his Medicaid, was not able to drink his medications since beginning of the month.  He will need help with meds at d/c. Case management will assist as possible.  CKD stage III  Presented with creatinine of 2.2,now 2.06.  UA/UDS unremarkable.  History of a stroke  Patient is on Plavix, continue.  Code Status: Full code  Family Communication: Plan discussed with the patient.  Disposition Plan: Remains inpatient  Consultants:   Cardiology . Procedures:   None Antibiotics:   Ceftriaxone/Zithromax 05/07/13>05/10/14.   Objective: Filed Vitals:   05/12/14 1406  BP: 110/65  Pulse: 78  Temp: 97.6 F (36.4 C)  Resp:     Intake/Output Summary (Last 24 hours) at 05/12/14 1840 Last data filed at 05/12/14 1419  Gross per 24 hour  Intake    321 ml  Output   1150 ml  Net   -829 ml   Filed Weights   05/09/14 0500 05/10/14 0558 05/12/14 1122  Weight: 121.383 kg (267 lb 9.6 oz) 121.609 kg (268 lb 1.6 oz) 122.2 kg (269 lb 6.4 oz)    Exam:   General:  Sitting up not in distress.  Cardiovascular: First and second heart sounds heard. RRR.  Respiratory: Lungs clear  Abdomen: Abdomen soft and nontender. Normal bowel sounds.  Musculoskeletal: No pedal edema.  Data Reviewed: Basic Metabolic Panel:  Recent Labs Lab 05/05/14 2258 05/06/14 0512 05/07/14 0405 05/08/14 0340 05/09/14 0433 05/10/14 0442 05/11/14 0511 05/12/14 0439  NA  --  144 138 135 136 139 137 139  K  --  3.1* 3.6 3.4* 3.5 3.7 3.6 3.7  CL  --  107 106  101 104 107 102 106  CO2  --  GLUCOSE  --  117* 90 114* 109* 121* 96 103*  BUN  --  23 24* 27* 30* 31* 30* 30*  CREATININE  --  1.82* 2.10* 2.04* 2.09* 2.19* 2.11* 2.06*  CALCIUM  --  8.5 8.3* 8.3* 8.6 8.7 8.8 8.7  MG 1.8 1.8 1.8  1.9  --  2.0  --   --   PHOS  --   --  3.3 3.7  --   --   --   --    Liver Function Tests:  Recent Labs Lab 05/07/14 0405 05/08/14 0340 05/10/14 0442 05/12/14 0439  AST  --  ALT  --  ALKPHOS  --  79 85 91  BILITOT  --  2.1* 1.6* 1.7*  PROT  --  6.8 6.5 6.4  ALBUMIN 3.2* 3.5 3.4* 3.4*   No results for input(s): LIPASE, AMYLASE in the last 168 hours. No results for input(s): AMMONIA in the last 168 hours. CBC:  Recent Labs Lab 05/05/14 1954 05/06/14 0512 05/08/14 0340 05/09/14 0433  WBC 5.3 5.7 4.1 4.1  HGB 11.6* 12.2* 11.1* 11.0*  HCT 40.9 42.1 38.9* 38.2*  MCV 70.3* 70.6* 70.5* 70.3*  PLT 248 252 257 231   Cardiac Enzymes:  Recent Labs Lab 05/06/14 0030 05/06/14 0610 05/06/14 1155  TROPONINI 0.04* 0.04* 0.04*   BNP (last 3 results)  Recent Labs  06/10/13 1740 06/15/13 0519 06/26/13 1238  PROBNP 3962.0* 426.5* 1941.0*   CBG:  Recent Labs Lab 05/11/14 1717 05/11/14 2055 05/12/14 0732 05/12/14 1152 05/12/14 1733  GLUCAP 133* 104* 110* 91 187*    No results found for this or any previous visit (from the past 240 hour(s)).   Studies: No results found.  Scheduled Meds: . atorvastatin  40 mg Oral q1800  . carvedilol  25 mg Oral BID WC  . clopidogrel  75 mg Oral Q breakfast  . furosemide  40 mg Intravenous BID  . heparin  5,000 Units Subcutaneous 3 times per day  . hydrALAZINE  50 mg Oral 3 times per day  . insulin aspart  0-15 Units Subcutaneous TID WC  . insulin detemir  5 Units Subcutaneous Q2200  . isosorbide mononitrate  15 mg Oral Daily  . pantoprazole  40 mg Oral Daily  . sodium chloride  3 mL Intravenous Q12H  . sodium chloride  3 mL Intravenous Q12H  . 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 If 7PM-7AM, please contact night-coverage at [email or web address], password [facility] 05/12/2014, 6:40 PM  LOS: 7 days

## 2014-05-12 NOTE — Progress Notes (Signed)
Daily weights were not taken the morning of 05/11/14 and 05/12/14. Will get a weight today and make sure that he continues to get a daily weight while he is here.

## 2014-05-13 LAB — GLUCOSE, CAPILLARY
GLUCOSE-CAPILLARY: 87 mg/dL (ref 70–99)
GLUCOSE-CAPILLARY: 132 mg/dL — AB (ref 70–99)
Glucose-Capillary: 123 mg/dL — ABNORMAL HIGH (ref 70–99)
Glucose-Capillary: 108 mg/dL — ABNORMAL HIGH (ref 70–99)

## 2014-05-13 MED ORDER — FUROSEMIDE 40 MG PO TABS
80.0000 mg | ORAL_TABLET | Freq: Two times a day (BID) | ORAL | Status: DC
  Administered 2014-05-13 – 2014-05-14 (×3): 80 mg via ORAL
  Filled 2014-05-13 (×3): qty 2

## 2014-05-13 NOTE — Progress Notes (Signed)
Report received from T. Nichols, RN. No change from initial pm assessment. Will continue to monitor and follow the POC.  

## 2014-05-13 NOTE — Progress Notes (Signed)
Subjective:  He says that his breathing is better.  Has had a lot of difficulties socially with being denied for disability, difficulty paying medicines and taking medicines. Swelling in abdomen and lower extremity swelling improved.   Objective:  Vital Signs in the last 24 hours: BP 111/77 mmHg  Pulse 78  Temp(Src) 98.2 F (36.8 C) (Oral)  Resp 18  Ht 5\' 8"  (1.727 m)  Wt 266 lb 15.6 oz (121.1 kg)  BMI 40.60 kg/m2  SpO2 99%  Physical Exam: Pleasant,  obese black male in no acute distress Lungs:  Clear Cardiac:  Regular rhythm, normal S1 and S2, soft S3 Abdomen: obese. Soft, NT. No definite ascites. Extremities: no edema present  Intake/Output from previous day: 01/18 0701 - 01/19 0700 In: 321 [P.O.:318; I.V.:3] Out: 2250 [Urine:2250]  Weight Filed Weights   05/10/14 0558 05/12/14 1122 05/13/14 0644  Weight: 268 lb 1.6 oz (121.609 kg) 269 lb 6.4 oz (122.2 kg) 266 lb 15.6 oz (121.1 kg)    Lab Results: Basic Metabolic Panel:  Recent Labs  95/28/41 0511 05/12/14 0439  NA 137 139  K 3.6 3.7  CL 102 106  CO2 23 24  GLUCOSE 96 103*  BUN 30* 30*  CREATININE 2.11* 2.06*   CBC: No results for input(s): WBC, NEUTROABS, HGB, HCT, MCV, PLT in the last 72 hours. Telemetry: Normal sinus rhythm   Assessment/Plan:  1.  Nonischemic cardiomyopathy with worsening since previous echo in February one year ago, now EF 15%.  ? Due to lack of medical compliance. 2.  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  Hypertensive heart disease. 4.  Obesity. 5.  Significant social and financial issues.  Recommendations:  Will switch to po lasix today. Hope to see continued diuresis. He has diuresed 6 liters since admission and weight is coming down..  Continue aldactone, hydralazine, nitrates, and Coreg. Since he lives in [place] recommend establishing  Cardiology follow up care there.  At his age with deterioration in LV function of this continues, may need to be considered  for advanced therapies.    Peter Swaziland MD, [facility]    05/13/2014, 6:54 AM

## 2014-05-13 NOTE — Progress Notes (Signed)
[facility] PROGRESS NOTE  Devon Ortiz JXB:[id number] DOB: [birth year] DOA: 05/05/2014 PCP: Dorrene German, MD  Summary/subjective Appreciate cardiology. Devon Ortiz is a 49 y.o. male with history of nonischemic cardiac disease with EF of 25-30%, combined systolic and diastolic heart failure(since decreased to 15% according to 2-D echocardiogram done during this admission), hypertension, diabetes mellitus, KKD stage 3 and history of noncompliance with medications who presented to the hospital after being off of his home medication regimen for 2 weeks and he had complaints of worsening edema, productive cough(yellow sputum), and worsening dyspnea on exertion . The concern was for Acute decompensation of CHF. He was placed on Lasix and trial of antibiotics before 2-D echo results. At this point there is no evidence of infection, therefore discontinued antibiotics.  He tolerated Lasix IV and has been switched to oral by cardiology today. Renal function has not changed much since admission, fortunately. We'll therefore continue management per cardiology. Patient will need to work with case management regarding proper medical follow-up and getting medications as he does not have health insurance at the moment. Likely he will discharge in the next 24-48 hours if he continues to do well. Plan  Acute combined systolic and systolic CHF  Reported rapid weight gain, shortness of breath and orthopnea.  Last 2-D echo from February 2015 showed LVEF of 30-35% with grade 2 diastolic dysfunction, repeat echo results show EF 15% with global hypokinesis.  Patient been off of his medication since the beginning of the month, reported he lost his insurance.  Fluid restriction, monitor intake/output, daily weight, restrict salt intake.  Contnue Lasix per cardiology, continue Aldactone. Check BMP in am Continue home medications including Coreg, Imdur, and hydralazine. Patient not candidate for ARB/ACE  inhibitor in view of progressive renal failure Follow cardiology recommendations Elevated troponin  Slightly elevated troponin of 0.04, likely secondary to CHF.  Patient denies any chest pain, EKG did not show any acute findings (sinus tachycardia).  Hypokalemia  Replaced with oral and parenteral supplements.  Noncompliance with medical therapy  Patient reports that he lost his Medicaid, was not able to drink his medications since beginning of the month.  He will need help with meds at d/c. Case management will assist as possible.  CKD stage III  Presented with creatinine of 2.2,now 2.06.  UA/UDS unremarkable.  History of a stroke  Patient is on Plavix, continue.  Code Status: Full code  Family Communication: Plan discussed with the patient.  Disposition Plan: Remains inpatient  Consultants:   Cardiology . Procedures:   None Antibiotics:   Ceftriaxone/Zithromax 05/07/13>05/10/14.   HPI/Subjective: Denies shortness of breath  Objective: Filed Vitals:   05/13/14 1401  BP: 113/77  Pulse: 70  Temp: 97.8 F (36.6 C)  Resp: 18    Intake/Output Summary (Last 24 hours) at 05/13/14 1828 Last data filed at 05/13/14 1001  Gross per 24 hour  Intake    480 ml  Output   1600 ml  Net  -1120 ml   Filed Weights   05/10/14 0558 05/12/14 1122 05/13/14 0644  Weight: 121.609 kg (268 lb 1.6 oz) 122.2 kg (269 lb 6.4 oz) 121.1 kg (266 lb 15.6 oz)    Exam:   General:  Comfortable at rest  Cardiovascular: RRR. S1S2 normal.  Respiratory: Lungs clear  Abdomen: Soft and nontender. Bowel sounds normal.  Musculoskeletal: No pedal edema.   Data Reviewed: Basic Metabolic Panel:  Recent Labs Lab 05/07/14 0405 05/08/14 0340 05/09/14 1308 05/10/14 0442 05/11/14 0511 05/12/14 0439  NA  138 135 136 139 137 139  K 3.6 3.4* 3.5 3.7 3.6 3.7  CL 106 101 104 107 102 106  CO2 23 23 23 24 23 24   GLUCOSE 90 114* 109* 121* 96 103*  BUN 24* 27* 30* 31* 30* 30*   CREATININE 2.10* 2.04* 2.09* 2.19* 2.11* 2.06*  CALCIUM 8.3* 8.3* 8.6 8.7 8.8 8.7  MG 1.8 1.9  --  2.0  --   --   PHOS 3.3 3.7  --   --   --   --    Liver Function Tests:  Recent Labs Lab 05/07/14 0405 05/08/14 0340 05/10/14 0442 05/12/14 0439  AST  --  18 21 23   ALT  --  17 21 22   ALKPHOS  --  79 85 91  BILITOT  --  2.1* 1.6* 1.7*  PROT  --  6.8 6.5 6.4  ALBUMIN 3.2* 3.5 3.4* 3.4*   No results for input(s): LIPASE, AMYLASE in the last 168 hours. No results for input(s): AMMONIA in the last 168 hours. CBC:  Recent Labs Lab 05/08/14 0340 05/09/14 0433  WBC 4.1 4.1  HGB 11.1* 11.0*  HCT 38.9* 38.2*  MCV 70.5* 70.3*  PLT 257 231   Cardiac Enzymes: No results for input(s): CKTOTAL, CKMB, CKMBINDEX, TROPONINI in the last 168 hours. BNP (last 3 results)  Recent Labs  06/10/13 1740 06/15/13 0519 06/26/13 1238  PROBNP 3962.0* 426.5* 1941.0*   CBG:  Recent Labs Lab 05/12/14 1733 05/12/14 2134 05/13/14 0735 05/13/14 1140 05/13/14 1649  GLUCAP 187* 108* 87 123* 108*    No results found for this or any previous visit (from the past 240 hour(s)).   Studies: No results found.  Scheduled Meds: . atorvastatin  40 mg Oral q1800  . carvedilol  25 mg Oral BID WC  . clopidogrel  75 mg Oral Q breakfast  . furosemide  80 mg Oral BID  . heparin  5,000 Units Subcutaneous 3 times per day  . hydrALAZINE  50 mg Oral 3 times per day  . insulin aspart  0-15 Units Subcutaneous TID WC  . insulin detemir  5 Units Subcutaneous Q2200  . isosorbide mononitrate  15 mg Oral Daily  . pantoprazole  40 mg Oral Daily  . sodium chloride  3 mL Intravenous Q12H  . sodium chloride  3 mL Intravenous Q12H  . spironolactone  12.5 mg Oral Daily   Continuous Infusions:   Active Problems:   Hypokalemia   H/O noncompliance with medical treatment, presenting hazards to health   Non-ischemic cardiomyopathy - EF 25-30% Sept 2014   Chronic combined systolic and diastolic HF (heart  failure), NYHA class 2   CKD (chronic kidney disease), stage III   Stroke   Hypertensive heart disease    Time spent: 15 minutes    [name]  [facility] Pager [phone]. If 7PM-7AM, please contact night-coverage at [email or web address], password [facility] 05/13/2014, 6:28 PM  LOS: 8 days

## 2014-05-14 LAB — BASIC METABOLIC PANEL
Anion gap: 9 (ref 5–15)
BUN: 33 mg/dL — ABNORMAL HIGH (ref 6–23)
CALCIUM: 8.9 mg/dL (ref 8.4–10.5)
CO2: 24 mmol/L (ref 19–32)
Chloride: 104 mEq/L (ref 96–112)
Creatinine, Ser: 2.04 mg/dL — ABNORMAL HIGH (ref 0.50–1.35)
GFR calc Af Amer: 43 mL/min — ABNORMAL LOW (ref >90)
GFR, EST NON AFRICAN AMERICAN: 37 mL/min — AB (ref >90)
Glucose, Bld: 125 mg/dL — ABNORMAL HIGH (ref 70–99)
Potassium: 4.2 mmol/L (ref 3.5–5.1)
SODIUM: 137 mmol/L (ref 135–145)

## 2014-05-14 LAB — GLUCOSE, CAPILLARY
GLUCOSE-CAPILLARY: 98 mg/dL (ref 70–99)
Glucose-Capillary: 99 mg/dL (ref 70–99)
Glucose-Capillary: 109 mg/dL — ABNORMAL HIGH (ref 70–99)
Glucose-Capillary: 139 mg/dL — ABNORMAL HIGH (ref 70–99)

## 2014-05-14 MED ORDER — SPIRONOLACTONE 25 MG PO TABS
25.0000 mg | ORAL_TABLET | Freq: Every day | ORAL | Status: DC
  Administered 2014-05-14 – 2014-05-18 (×5): 25 mg via ORAL
  Filled 2014-05-14 (×5): qty 1

## 2014-05-14 MED ORDER — METOLAZONE 2.5 MG PO TABS
2.5000 mg | ORAL_TABLET | Freq: Once | ORAL | Status: AC
  Administered 2014-05-14: 2.5 mg via ORAL
  Filled 2014-05-14: qty 1

## 2014-05-14 MED ORDER — FUROSEMIDE 10 MG/ML IJ SOLN
40.0000 mg | Freq: Two times a day (BID) | INTRAMUSCULAR | Status: DC
Start: 2014-05-14 — End: 2014-05-16
  Administered 2014-05-14 – 2014-05-15 (×4): 40 mg via INTRAVENOUS
  Filled 2014-05-14 (×4): qty 4

## 2014-05-14 NOTE — Progress Notes (Signed)
CARE MANAGEMENT NOTE 05/14/2014  Patient:  MANPREET, MIZZELL   Account Number:  [id number]  Date Initiated:  05/06/2014  Documentation initiated by:  Trinna Balloon  Subjective/Objective Assessment:   pt admitted with cco SOB, cough, DOE     Action/Plan:   from home   Anticipated DC Date:  05/08/2014   Anticipated DC Plan:  HOME/SELF CARE      DC Planning Services  CM consult  Follow-up appt scheduled  GCCN / P4HM (established/new)      Choice offered to / List presented to:  C-1 Patient           Status of service:  In process, will continue to follow Medicare Important Message given?   (If response is "NO", the following Medicare IM given date fields will be blank) Date Medicare IM given:   Medicare IM given by:   Date Additional Medicare IM given:   Additional Medicare IM given by:    Discharge Disposition:    Per UR Regulation:  Reviewed for med. necessity/level of care/duration of stay  If discussed at Long Length of Stay Meetings, dates discussed:    Comments:  05/09/14 MMcGibboney, RN, BSN RESOURCE information given to pt for Free or Reduced [employer] in [place].  05/09/14 MMcGibboney, RN, BSN Spoke with pt concerning PCP. Pt given an appointment with [facility], 05/19/14 at 3:30 PM.  05/06/14 MMcGibboney, RN, BSN Chart reviewed. Verifying insurance coverage.

## 2014-05-14 NOTE — Progress Notes (Signed)
Subjective:  He is concerned about weight going up. Reports that in his stable state weight was 248 lbs.  Has had a lot of difficulties paying medicines and taking medicines. Reports disability application in process and may have a decision within the month.   Objective:  Vital Signs in the last 24 hours: BP 135/103 mmHg  Pulse 76  Temp(Src) 98.7 F (37.1 C) (Oral)  Resp 20  Ht 5\' 8"  (1.727 m)  Wt 267 lb 9.6 oz (121.383 kg)  BMI 40.70 kg/m2  SpO2 97%  Physical Exam: Pleasant,  obese black male in no acute distress Lungs:  Few rales Cardiac:  Regular rhythm, normal S1 and S2, soft S3 Abdomen: obese. Soft, NT. No definite ascites. Extremities: 1+ edema present  Intake/Output from previous day: 01/19 0701 - 01/20 0700 In: 1560 [P.O.:1560] Out: 1301 [Urine:1300; Stool:1]  Weight Filed Weights   05/12/14 1122 05/13/14 0644 05/14/14 0529  Weight: 269 lb 6.4 oz (122.2 kg) 266 lb 15.6 oz (121.1 kg) 267 lb 9.6 oz (121.383 kg)    Lab Results: Basic Metabolic Panel:  Recent Labs  83/25/49 0439  NA 139  K 3.7  CL 106  CO2 24  GLUCOSE 103*  BUN 30*  CREATININE 2.06*   CBC: No results for input(s): WBC, NEUTROABS, HGB, HCT, MCV, PLT in the last 72 hours. Telemetry: Normal sinus rhythm, rare PVC.  Assessment/Plan:  1.  Nonischemic cardiomyopathy with worsening since previous echo in February one year ago, now EF 15%. Exacerbated by lack of medical compliance. Unfortunately weight has increased on oral lasix. Diuresis not sustained. Patient probably 10-15 lbs above dry weight.  2.  Stage 3-4 chronic kidney disease. Unchanged. No BMET ordered yesterday or today. 3.  Hypertensive heart disease. 4.  Obesity. 5.  Significant social and financial issues.  Recommendations:  Will resume IV lasix today. Increase aldactone to 25 mg daily. Give metolazone 2.5 mg today. Would like to see better diruesis and getting to dry weight prior to DC. When in hospital Feb 2015 DC weight was  241 lbs. In CHF clinic March 2015 it was 260 lbs. Continue aldactone, hydralazine, nitrates, and Coreg. Since he lives in [place] recommend establishing  Cardiology follow up care there- probably with advanced heart failure clinic at [facility].  At his age with deterioration in LV function of this continues, may need to be considered for advanced therapies.    Peter Swaziland MD, [facility]    05/14/2014, 9:03 AM

## 2014-05-14 NOTE — Progress Notes (Signed)
[facility] PROGRESS NOTE  Devon Ortiz ZJI:[id number] DOB: 1966-01-06 DOA: 05/05/2014 PCP: Philis Fendt, MD  Summary/subjective Appreciate cardiology. Devon Ortiz is a 49 y.o. male with history of nonischemic cardiac disease with EF of 17-51%, combined systolic and diastolic heart failure(since decreased to 15% according to 2-D echocardiogram done during this admission), hypertension, diabetes mellitus, KKD stage 3 and history of noncompliance with medications who presented to the hospital after being off of his home medication regimen for 2 weeks and he had complaints of worsening edema, productive cough(yellow sputum), and worsening dyspnea on exertion . The concern was for Acute decompensation of CHF. He was placed on Lasix and trial of antibiotics before 2-D echo results. At this point there is no evidence of infection, therefore discontinued antibiotics.  He tolerated Lasix IV and has been switched to oral by cardiology today. Renal function has not changed much since admission, fortunately. We'll therefore continue management per cardiology. Patient will need to work with case management regarding proper medical follow-up and getting medications as he does not have health insurance at the moment.     Plan  Acute combined systolic and systolic CHF  Reported rapid weight gain, shortness of breath and orthopnea.  Last 2-D echo from February 2015 showed LVEF of 30-35% with grade 2 diastolic dysfunction, repeat echo results show EF 15% with global hypokinesis.  Patient been off of his medication since the beginning of the month, reported he lost his insurance.  Fluid restriction, monitor intake/output, daily weight, restrict salt intake.  Contnue Lasix per cardiology, continue Aldactone. Check BMP in am Continue home medications including Coreg, Imdur, and hydralazine. Patient not candidate for ARB/ACE inhibitor in view of progressive renal failure Weight gain, started on IV  lasix 1-20, dose of metolazone.   Elevated troponin  Slightly elevated troponin of 0.04, likely secondary to CHF.  Patient denies any chest pain, EKG did not show any acute findings (sinus tachycardia).   Hypokalemia  Resolved.   Noncompliance with medical therapy  Patient reports that he lost his Medicaid, was not able to drink his medications since beginning of the month.  He will need help with meds at d/c. Case management will assist as possible.   CKD stage III  Presented with creatinine of 2.2,now 2.06.  UA/UDS unremarkable.  Daily B-met. Monitor renal function on lasix.   History of a stroke  Patient is on Plavix, continue.    Code Status: Full code  Family Communication: Plan discussed with the patient.  Disposition Plan: Remains inpatient  Consultants:   Cardiology . Procedures:   None Antibiotics:   Ceftriaxone/Zithromax 05/07/13>05/10/14.   HPI/Subjective: Denies shortness of breath. He has gain 10 pound in the hospital.  Had a BM recently  Objective: Filed Vitals:   05/14/14 1345  BP: 115/70  Pulse: 70  Temp: 98 F (36.7 C)  Resp: 18    Intake/Output Summary (Last 24 hours) at 05/14/14 1612 Last data filed at 05/14/14 1348  Gross per 24 hour  Intake   1800 ml  Output   2851 ml  Net  -1051 ml   Filed Weights   05/12/14 1122 05/13/14 0644 05/14/14 0529  Weight: 122.2 kg (269 lb 6.4 oz) 121.1 kg (266 lb 15.6 oz) 121.383 kg (267 lb 9.6 oz)    Exam:   General:  Comfortable at rest  Cardiovascular: RRR. S1S2 normal.  Respiratory: Lungs clear  Abdomen: Soft and nontender. Bowel sounds normal.  Musculoskeletal: No pedal edema.   Data Reviewed: Basic Metabolic  Panel:  Recent Labs Lab 05/08/14 0340 05/09/14 0433 05/10/14 0442 05/11/14 0511 05/12/14 0439 05/14/14 0950  NA 135 136 139 137 139 137  K 3.4* 3.5 3.7 3.6 3.7 4.2  CL 101 104 107 102 106 104  CO2 _0 GLUCOSE 114* 109* 121* 96 103* 125*   BUN 27* 30* 31* 30* 30* 33*  CREATININE 2.04* 2.09* 2.19* 2.11* 2.06* 2.04*  CALCIUM 8.3* 8.6 8.7 8.8 8.7 8.9  MG 1.9  --  2.0  --   --   --   PHOS 3.7  --   --   --   --   --    Liver Function Tests:  Recent Labs Lab 05/08/14 0340 05/10/14 0442 05/12/14 0439  AST _1 ALT _2 ALKPHOS 79 85 91  BILITOT 2.1* 1.6* 1.7*  PROT 6.8 6.5 6.4  ALBUMIN 3.5 3.4* 3.4*   No results for input(s): LIPASE, AMYLASE in the last 168 hours. No results for input(s): AMMONIA in the last 168 hours. CBC:  Recent Labs Lab 05/08/14 0340 05/09/14 0433  WBC 4.1 4.1  HGB 11.1* 11.0*  HCT 38.9* 38.2*  MCV 70.5* 70.3*  PLT 257 231   Cardiac Enzymes: No results for input(s): CKTOTAL, CKMB, CKMBINDEX, TROPONINI in the last 168 hours. BNP (last 3 results)  Recent Labs  06/10/13 1740 06/15/13 0519 06/26/13 1238  PROBNP 3962.0* 426.5* 1941.0*   CBG:  Recent Labs Lab 05/13/14 1140 05/13/14 1649 05/13/14 2144 05/14/14 0753 05/14/14 1141  GLUCAP 123* 108* 132* 99 98    No results found for this or any previous visit (from the past 240 hour(s)).   Studies: No results found.  Scheduled Meds: . atorvastatin  40 mg Oral q1800  . carvedilol  25 mg Oral BID WC  . clopidogrel  75 mg Oral Q breakfast  . furosemide  40 mg Intravenous Q12H  . heparin  5,000 Units Subcutaneous 3 times per day  . hydrALAZINE  50 mg Oral 3 times per day  . insulin aspart  0-15 Units Subcutaneous TID WC  . insulin detemir  5 Units Subcutaneous Q2200  . isosorbide mononitrate  15 mg Oral Daily  . pantoprazole  40 mg Oral Daily  . sodium chloride  3 mL Intravenous Q12H  . sodium chloride  3 mL Intravenous Q12H  . spironolactone  25 mg Oral Daily   Continuous Infusions:   Active Problems:   Hypokalemia   H/O noncompliance with medical treatment, presenting hazards to health   Non-ischemic cardiomyopathy - EF 25-30% Sept 2014   Chronic combined systolic and diastolic HF (heart failure), NYHA  class 2   CKD (chronic kidney disease), stage III   Stroke   Hypertensive heart disease    Time spent: 15 minutes    Regalado, [facility] Pager [phone]. If 7PM-7AM, please contact night-coverage at [email or web address], password [facility] 05/14/2014, 4:12 PM  LOS: 9 days

## 2014-05-15 LAB — GLUCOSE, CAPILLARY
GLUCOSE-CAPILLARY: 92 mg/dL (ref 70–99)
Glucose-Capillary: 95 mg/dL (ref 70–99)
Glucose-Capillary: 136 mg/dL — ABNORMAL HIGH (ref 70–99)

## 2014-05-15 LAB — BASIC METABOLIC PANEL
ANION GAP: 9 (ref 5–15)
BUN: 36 mg/dL — AB (ref 6–23)
CO2: 29 mmol/L (ref 19–32)
CREATININE: 2.12 mg/dL — AB (ref 0.50–1.35)
Calcium: 8.9 mg/dL (ref 8.4–10.5)
Chloride: 97 mEq/L (ref 96–112)
GFR calc Af Amer: 41 mL/min — ABNORMAL LOW (ref >90)
GFR calc non Af Amer: 35 mL/min — ABNORMAL LOW (ref >90)
Glucose, Bld: 98 mg/dL (ref 70–99)
Potassium: 3.6 mmol/L (ref 3.5–5.1)
Sodium: 135 mmol/L (ref 135–145)

## 2014-05-15 NOTE — Progress Notes (Signed)
Subjective:  Excellent diuresis yesterday. Reports that in his dry weight was 248 lbs.  Has had a lot of difficulties paying medicines and taking medicines. Reports disability application in process and may have a decision within the month.   Objective:  Vital Signs in the last 24 hours: BP 119/81 mmHg  Pulse 70  Temp(Src) 97.9 F (36.6 C) (Oral)  Resp 20  Ht 5\' 8"  (1.727 m)  Wt 257 lb 1.6 oz (116.62 kg)  BMI 39.10 kg/m2  SpO2 100%  Physical Exam: Pleasant,  obese black male in no acute distress Lungs:  clear Cardiac:  Regular rhythm, normal S1 and S2, soft S3 Abdomen: obese. Soft, NT. No definite ascites. Extremities: Tr edema present  Intake/Output from previous day: 01/20 0701 - 01/21 0700 In: 1080 [P.O.:1080] Out: 5400 [Urine:5400]  Weight Filed Weights   05/13/14 0644 05/14/14 0529 05/15/14 0618  Weight: 266 lb 15.6 oz (121.1 kg) 267 lb 9.6 oz (121.383 kg) 257 lb 1.6 oz (116.62 kg)    Lab Results: Basic Metabolic Panel:  Recent Labs  44/97/53 0950 05/15/14 0550  NA 137 135  K 4.2 3.6  CL 104 97  CO2 24 29  GLUCOSE 125* 98  BUN 33* 36*  CREATININE 2.04* 2.12*   CBC: No results for input(s): WBC, NEUTROABS, HGB, HCT, MCV, PLT in the last 72 hours. Telemetry: Normal sinus rhythm, 13 beat run AIVR  Assessment/Plan:  1.  Nonischemic cardiomyopathy with worsening since previous echo in February one year ago, now EF 15%. Exacerbated by lack of medical compliance. Excellent diuresis yesterday with IV lasix and metolazone. I/O negative 4.5 liters. Weight down to 257. Goal weight 248-250 lbs.  2.  Stage 3-4 chronic kidney disease. Unchanged.  3.  Hypertensive heart disease. 4.  Obesity. 5.  Significant social and financial issues.  Recommendations:  Will continue  IV lasix today. Increase aldactone to 25 mg daily. Goal weight 250.  Continue aldactone, hydralazine, nitrates, and Coreg. May be able to use metolazone prn if weight increasing. Since he lives in  [place] recommend establishing  Cardiology follow up care there- probably with advanced heart failure clinic at [facility].  At his age with deterioration in LV function of this continues, may need to be considered for advanced therapies.    Johnwesley Lederman [place] MD, [facility]    05/15/2014, 7:08 AM

## 2014-05-15 NOTE — Progress Notes (Signed)
[facility] PROGRESS NOTE  Devon Ortiz QRF:[id number] DOB: [birth year] DOA: 05/05/2014 PCP: Philis Fendt, MD  Summary/subjective Appreciate cardiology. Devon Ortiz is a 49 y.o. male with history of nonischemic cardiac disease with EF of 82-64%, combined systolic and diastolic heart failure(since decreased to 15% according to 2-D echocardiogram done during this admission), hypertension, diabetes mellitus, KKD stage 3 and history of noncompliance with medications who presented to the hospital after being off of his home medication regimen for 2 weeks and he had complaints of worsening edema, productive cough(yellow sputum), and worsening dyspnea on exertion . The concern was for Acute decompensation of CHF. He was placed on Lasix and trial of antibiotics before 2-D echo results. At this point there is no evidence of infection, therefore discontinued antibiotics.   Renal function has been stable. Lasix was change to IV 1-20 due to increase weight.    Plan  Acute combined systolic and systolic CHF  Reported rapid weight gain, shortness of breath and orthopnea.  Last 2-D echo from February 2015 showed LVEF of 30-35% with grade 2 diastolic dysfunction, repeat echo results show EF 15% with global hypokinesis.  Patient been off of his medication since the beginning of the month, reported he lost his insurance.  Fluid restriction, monitor intake/output, daily weight, restrict salt intake.  Contnue Lasix per cardiology, continue Aldactone. Check BMP in am Continue home medications including Coreg, Imdur, and hydralazine. Patient not candidate for ARB/ACE inhibitor in view of progressive renal failure Re-started on IV lasix 1-20, received a dose  of metolazone 1-20  Weight 121 Kg (1-19) ----116 (1-21)  Elevated troponin  Slightly elevated troponin of 0.04, likely secondary to CHF.  Patient denies any chest pain, EKG did not show any acute findings (sinus tachycardia).   Hypokalemia   Resolved.   Noncompliance with medical therapy  Patient reports that he lost his Medicaid, was not able to drink his medications since beginning of the month.  He will need help with meds at d/c. Case management will assist as possible.   CKD stage III  Presented with creatinine of 2.2,now 2.06.  UA/UDS unremarkable.  Daily B-met. Monitor renal function on lasix.   History of a stroke  Patient is on Plavix, continue.    Code Status: Full code  Family Communication: Plan discussed with the patient.  Disposition Plan: Remains inpatient  Consultants:   Cardiology . Procedures:   None Antibiotics:   Ceftriaxone/Zithromax 05/07/13>05/10/14.   HPI/Subjective: He is feeling better. Breathing better.   Objective: Filed Vitals:   05/15/14 1101  BP: 124/77  Pulse: 66  Temp:   Resp:     Intake/Output Summary (Last 24 hours) at 05/15/14 1309 Last data filed at 05/15/14 0625  Gross per 24 hour  Intake    480 ml  Output   5000 ml  Net  -4520 ml   Filed Weights   05/13/14 0644 05/14/14 0529 05/15/14 0618  Weight: 121.1 kg (266 lb 15.6 oz) 121.383 kg (267 lb 9.6 oz) 116.62 kg (257 lb 1.6 oz)    Exam:   General:  Comfortable at rest  Cardiovascular: RRR. S1S2 normal.  Respiratory: Lungs clear  Abdomen: Soft and nontender. Bowel sounds normal.  Musculoskeletal: No pedal edema.   Data Reviewed: Basic Metabolic Panel:  Recent Labs Lab 05/10/14 0442 05/11/14 0511 05/12/14 0439 05/14/14 0950 05/15/14 0550  NA 139 137 139 137 135  K 3.7 3.6 3.7 4.2 3.6  CL 107 102 106 104 97  CO2 _0 24  29  GLUCOSE 121* 96 103* 125* 98  BUN 31* 30* 30* 33* 36*  CREATININE 2.19* 2.11* 2.06* 2.04* 2.12*  CALCIUM 8.7 8.8 8.7 8.9 8.9  MG 2.0  --   --   --   --    Liver Function Tests:  Recent Labs Lab 05/10/14 0442 05/12/14 0439  AST 21 23  ALT 21 22  ALKPHOS 85 91  BILITOT 1.6* 1.7*  PROT 6.5 6.4  ALBUMIN 3.4* 3.4*   No results for input(s):  LIPASE, AMYLASE in the last 168 hours. No results for input(s): AMMONIA in the last 168 hours. CBC:  Recent Labs Lab 05/09/14 0433  WBC 4.1  HGB 11.0*  HCT 38.2*  MCV 70.3*  PLT 231   Cardiac Enzymes: No results for input(s): CKTOTAL, CKMB, CKMBINDEX, TROPONINI in the last 168 hours. BNP (last 3 results)  Recent Labs  06/10/13 1740 06/15/13 0519 06/26/13 1238  PROBNP 3962.0* 426.5* 1941.0*   CBG:  Recent Labs Lab 05/14/14 1141 05/14/14 1712 05/14/14 2112 05/15/14 0739 05/15/14 1153  GLUCAP 98 109* 139* 95 92    No results found for this or any previous visit (from the past 240 hour(s)).   Studies: No results found.  Scheduled Meds: . atorvastatin  40 mg Oral q1800  . carvedilol  25 mg Oral BID WC  . clopidogrel  75 mg Oral Q breakfast  . furosemide  40 mg Intravenous Q12H  . heparin  5,000 Units Subcutaneous 3 times per day  . hydrALAZINE  50 mg Oral 3 times per day  . insulin aspart  0-15 Units Subcutaneous TID WC  . insulin detemir  5 Units Subcutaneous Q2200  . isosorbide mononitrate  15 mg Oral Daily  . pantoprazole  40 mg Oral Daily  . sodium chloride  3 mL Intravenous Q12H  . sodium chloride  3 mL Intravenous Q12H  . spironolactone  25 mg Oral Daily   Continuous Infusions:   Principal Problem:   Acute on chronic combined systolic and diastolic congestive heart failure Active Problems:   Hypokalemia   H/O noncompliance with medical treatment, presenting hazards to health   Non-ischemic cardiomyopathy - EF 25-30% Sept 2014   Chronic combined systolic and diastolic HF (heart failure), NYHA class 2   CKD (chronic kidney disease), stage III   Stroke   Hypertensive heart disease    Time spent: 15 minutes    Regalado, Belkys A  [facility] Pager [phone]. If 7PM-7AM, please contact night-coverage at [email or web address], password [email or web address] 05/15/2014, 1:09 PM  LOS: 10 days             

## 2014-05-16 DIAGNOSIS — Z9114 Patient's other noncompliance with medication regimen: Secondary | ICD-10-CM

## 2014-05-16 DIAGNOSIS — I639 Cerebral infarction, unspecified: Secondary | ICD-10-CM

## 2014-05-16 DIAGNOSIS — R0602 Shortness of breath: Secondary | ICD-10-CM

## 2014-05-16 LAB — BASIC METABOLIC PANEL
ANION GAP: 13 (ref 5–15)
BUN: 43 mg/dL — ABNORMAL HIGH (ref 6–23)
CALCIUM: 9.4 mg/dL (ref 8.4–10.5)
CO2: 29 mmol/L (ref 19–32)
Chloride: 95 mEq/L — ABNORMAL LOW (ref 96–112)
Creatinine, Ser: 2.4 mg/dL — ABNORMAL HIGH (ref 0.50–1.35)
GFR calc Af Amer: 35 mL/min — ABNORMAL LOW (ref >90)
GFR, EST NON AFRICAN AMERICAN: 30 mL/min — AB (ref >90)
Glucose, Bld: 100 mg/dL — ABNORMAL HIGH (ref 70–99)
Potassium: 3.8 mmol/L (ref 3.5–5.1)
SODIUM: 137 mmol/L (ref 135–145)

## 2014-05-16 LAB — GLUCOSE, CAPILLARY
Glucose-Capillary: 139 mg/dL — ABNORMAL HIGH (ref 70–99)
Glucose-Capillary: 125 mg/dL — ABNORMAL HIGH (ref 70–99)
Glucose-Capillary: 98 mg/dL (ref 70–99)
Glucose-Capillary: 123 mg/dL — ABNORMAL HIGH (ref 70–99)
Glucose-Capillary: 150 mg/dL — ABNORMAL HIGH (ref 70–99)

## 2014-05-16 MED ORDER — FUROSEMIDE 10 MG/ML IJ SOLN
40.0000 mg | Freq: Every day | INTRAMUSCULAR | Status: DC
  Administered 2014-05-16: 40 mg via INTRAVENOUS
  Filled 2014-05-16: qty 4

## 2014-05-16 NOTE — Progress Notes (Signed)
[facility] PROGRESS NOTE Interim History: Appreciate cardiology. Devon Ortiz is a 49 y.o. male with history of nonischemic cardiac disease with EF of 40-98%, combined systolic and diastolic heart failure(since decreased to 15% according to 2-D echocardiogram done during this admission), hypertension, diabetes mellitus, KKD stage 3 and history of noncompliance with medications who presented to the hospital after being off of his home medication regimen for 2 weeks and he had complaints of worsening edema, productive cough(yellow sputum), and worsening dyspnea on exertion . The concern was for Acute decompensation of CHF. He was placed on Lasix and trial of antibiotics before 2-D echo results. At this point there is no evidence of infection, therefore discontinued antibiotics.   Filed Weights   05/14/14 0529 05/15/14 0618 05/16/14 0505  Weight: 121.383 kg (267 lb 9.6 oz) 116.62 kg (257 lb 1.6 oz) 115.4 kg (254 lb 6.6 oz)        Intake/Output Summary (Last 24 hours) at 05/16/14 1148 Last data filed at 05/16/14 1191  Gross per 24 hour  Intake    720 ml  Output   3150 ml  Net  -2430 ml    Assessment/Plan:  Acute combined systolic and systolic CHF  Reported rapid weight gain, shortness of breath and orthopnea.  Last 2-D echo from February 2015 showed LVEF of 30-35% with grade 2 diastolic dysfunction, repeat echo results show EF 15% with global hypokinesis.  -improving and close to dry weight -good diuresis -Fluid restriction, monitor intake/output and daily weights  -Contnue Lasix per cardiology, continue Aldactone. Check BMP daily -Continue home medications including Coreg, Imdur, and hydralazine. Patient not candidate for ARB/ACE inhibitor in view of progressive renal failure -will repeat BNP in am and most likely discharge home  Elevated troponin  -Slightly elevated troponin of 0.04, likely secondary to CHF.  -Patient denies any chest pain, EKG did not show any acute  findings and telemetry stable  Hypokalemia  Resolved.   Noncompliance with medical therapy  -Patient reports that he lost his Medicaid, was not able to pay for his medications since beginning of the month.  -He will need help with meds at d/c.  -Case management will assist as possible. -needs resumption of follow up with heart failure clinic   CKD stage III  -Presented with creatinine of 2.2; baseline is unknown (but around 2-2.3 at baseline) -UA/UDS unremarkable.  -Daily B-met. Monitor renal function while on lasix.  -Cr is slightly above baseline, most likely with lasix; but good urine output and with stable GFR  History of a stroke  -Patient is on Plavix, continue.  -no new deficit appreciated  Code Status: Full Family Communication: no family at bedside Disposition Plan: home when medically stable   Consultants:  Cardiology   Procedures: ECHO:  - Left ventricle: Echo density at apex, cannot exclude laminated mural thrombus. The cavity size was mildly dilated. Wall thickness was increased in a pattern of mild LVH. Systolic function was severely reduced. The estimated ejection fraction was in the range of 15% to 20%. Diffuse hypokinesis. Although no diagnostic regional wall motion abnormality was identified, this possibility cannot be completely excluded on the basis of this study. - Mitral valve: There was moderate to severe regurgitation. - Left atrium: The atrium was moderately dilated. - Right ventricle: The cavity size was mildly dilated. Systolic function was mildly to moderately reduced. - Right atrium: The atrium was moderately dilated. - Tricuspid valve: There was mild-moderate regurgitation. - Pulmonary arteries: Systolic pressure was mildly increased. PA peak  pressure: 37 mm Hg (S).  Antibiotics:  None   HPI/Subjective: Feeling a lot better. Reports breathing is almost back to baseline. No CP.  Objective: Filed Vitals:     05/15/14 1101 05/15/14 1424 05/15/14 2225 05/16/14 0505  BP: 124/77 122/70 121/84 128/85  Pulse: 66 70 71 69  Temp:  98 F (36.7 C) 97.9 F (36.6 C) 98.3 F (36.8 C)  TempSrc:  Oral Oral Axillary  Resp:  '20 20 20  ' Height:      Weight:    115.4 kg (254 lb 6.6 oz)  SpO2: 100% 97% 100% 100%     Exam:  General: Alert, awake, oriented x3, in no acute distress.  HEENT: No bruits, no goiter; no JVD Heart: Regular rate and rhythm, without rubs or gallops. Positive 1+ edema bilaterally  Lungs: improved air movement, fine bibasilar crackles appreciated. No Wheezing.  Abdomen: Soft, nontender, nondistended, positive bowel sounds.  Neuro: Grossly intact, nonfocal.   Data Reviewed: Basic Metabolic Panel:  Recent Labs Lab 05/10/14 0442 05/11/14 0511 05/12/14 0439 05/14/14 0950 05/15/14 0550 05/16/14 0530  NA 139 137 139 137 135 137  K 3.7 3.6 3.7 4.2 3.6 3.8  CL 107 102 106 104 97 95*  CO2 '24 23 24 24 29 29  ' GLUCOSE 121* 96 103* 125* 98 100*  BUN 31* 30* 30* 33* 36* 43*  CREATININE 2.19* 2.11* 2.06* 2.04* 2.12* 2.40*  CALCIUM 8.7 8.8 8.7 8.9 8.9 9.4  MG 2.0  --   --   --   --   --    Liver Function Tests:  Recent Labs Lab 05/10/14 0442 05/12/14 0439  AST 21 23  ALT 21 22  ALKPHOS 85 91  BILITOT 1.6* 1.7*  PROT 6.5 6.4  ALBUMIN 3.4* 3.4*   BNP (last 3 results)  Recent Labs  06/10/13 1740 06/15/13 0519 06/26/13 1238  PROBNP 3962.0* 426.5* 1941.0*   CBG:  Recent Labs Lab 05/15/14 0739 05/15/14 1153 05/15/14 1737 05/15/14 2231 05/16/14 0746  GLUCAP 95 92 136* 139* 125*    Studies: No results found.  Scheduled Meds: . atorvastatin  40 mg Oral q1800  . carvedilol  25 mg Oral BID WC  . clopidogrel  75 mg Oral Q breakfast  . furosemide  40 mg Intravenous Daily  . heparin  5,000 Units Subcutaneous 3 times per day  . hydrALAZINE  50 mg Oral 3 times per day  . insulin aspart  0-15 Units Subcutaneous TID WC  . insulin detemir  5 Units Subcutaneous  Q2200  . isosorbide mononitrate  15 mg Oral Daily  . pantoprazole  40 mg Oral Daily  . sodium chloride  3 mL Intravenous Q12H  . sodium chloride  3 mL Intravenous Q12H  . spironolactone  25 mg Oral Daily   Continuous Infusions:    Barton Dubois  [facility] Pager [phone]. If 8PM-8AM, please contact night-coverage at [email or web address], password [facility] 05/16/2014, 11:48 AM  LOS: 11 days

## 2014-05-16 NOTE — Progress Notes (Signed)
Pt does not require any assistance with home CPAP or with mask application. Pt has already placed himself on CPAP and is resting comfortably. RT will continue to monitor as needed.

## 2014-05-16 NOTE — Progress Notes (Signed)
Subjective:  Continues good diuresis. Reports that in his dry weight was 248 lbs.  Has had a lot of difficulties paying medicines and taking medicines. Reports disability application in process and may have a decision within the month.   Objective:  Vital Signs in the last 24 hours: BP 128/85 mmHg  Pulse 69  Temp(Src) 98.3 F (36.8 C) (Axillary)  Resp 20  Ht 5\' 8"  (1.727 m)  Wt 254 lb 6.6 oz (115.4 kg)  BMI 38.69 kg/m2  SpO2 100%  Physical Exam: Pleasant,  obese black male in no acute distress Lungs:  clear Cardiac:  Regular rhythm, normal S1 and S2, soft S3 Abdomen: obese. Soft, NT. No definite ascites. Extremities: Tr edema present  Intake/Output from previous day: 01/21 0701 - 01/22 0700 In: 960 [P.O.:960] Out: 3150 [Urine:3150]  Weight Filed Weights   05/14/14 0529 05/15/14 0618 05/16/14 0505  Weight: 267 lb 9.6 oz (121.383 kg) 257 lb 1.6 oz (116.62 kg) 254 lb 6.6 oz (115.4 kg)    Lab Results: Basic Metabolic Panel:  Recent Labs  88/67/73 0550 05/16/14 0530  NA 135 137  K 3.6 3.8  CL 97 95*  CO2 29 29  GLUCOSE 98 100*  BUN 36* 43*  CREATININE 2.12* 2.40*   CBC: No results for input(s): WBC, NEUTROABS, HGB, HCT, MCV, PLT in the last 72 hours. Telemetry: Normal sinus rhythm, 13 beat run AIVR  Assessment/Plan:  1.  Nonischemic cardiomyopathy with worsening since previous echo in February one year ago, now EF 15%. Exacerbated by lack of medical compliance. Excellent diuresis with IV lasix. Received one dose of metolazone 05/14/14. . I/O negative 2.1 liters yesterday. Weight down to 254. Has lost 13 lbs in last 2 days.Goal weight 248-250 lbs. Now started to see increase in BUN/creatinine with diuresis. Will only give one dose of lasix today and continue to follow BMET closely. 2.  Stage 3-4 chronic kidney disease. 3.  Hypertensive heart disease. 4.  Obesity. 5.  Significant social and financial issues.  Recommendations:  Will continue  IV lasix x 1 today.   Goal weight 250.  Continue aldactone, hydralazine, nitrates, and Coreg. Avoid ACEi/ARB due to CKD.  May be able to use metolazone prn if weight increasing. Since he lives in [place] recommend establishing  Cardiology follow up care there- probably with advanced heart failure clinic at [facility].  At his age with deterioration in LV function of this continues, may need to be considered for advanced therapies.    Rain Wilhide [place] MD, [facility]    05/16/2014, 7:14 AM

## 2014-05-16 NOTE — Progress Notes (Signed)
Asked by RN to look at patient's cpap unit that something was leaking. Discovered small hole in tubing and informed patient he could tape with cloth for right now till I could get a setup from the dept. But patient really needed to call [employer] and get the tubing replaced. Patient declined our setup and said he would call [employer] later. Patient said [employer] had checked unit out but I did see sticker. At 1640 I called service response and asked for [employer] to check their records to see if the unit had been approved for hospital use. Patient may be discharged in Am if BMP okay.

## 2014-05-17 DIAGNOSIS — I10 Essential (primary) hypertension: Secondary | ICD-10-CM | POA: Insufficient documentation

## 2014-05-17 DIAGNOSIS — N179 Acute kidney failure, unspecified: Secondary | ICD-10-CM | POA: Insufficient documentation

## 2014-05-17 LAB — BASIC METABOLIC PANEL
Anion gap: 11 (ref 5–15)
BUN: 42 mg/dL — AB (ref 6–23)
CALCIUM: 9.2 mg/dL (ref 8.4–10.5)
CHLORIDE: 95 mmol/L — AB (ref 96–112)
CO2: 30 mmol/L (ref 19–32)
Creatinine, Ser: 2.31 mg/dL — ABNORMAL HIGH (ref 0.50–1.35)
GFR calc non Af Amer: 32 mL/min — ABNORMAL LOW (ref >90)
GFR, EST AFRICAN AMERICAN: 37 mL/min — AB (ref >90)
GLUCOSE: 101 mg/dL — AB (ref 70–99)
POTASSIUM: 3.8 mmol/L (ref 3.5–5.1)
Sodium: 136 mmol/L (ref 135–145)

## 2014-05-17 LAB — GLUCOSE, CAPILLARY
GLUCOSE-CAPILLARY: 107 mg/dL — AB (ref 70–99)
Glucose-Capillary: 116 mg/dL — ABNORMAL HIGH (ref 70–99)
Glucose-Capillary: 166 mg/dL — ABNORMAL HIGH (ref 70–99)
Glucose-Capillary: 168 mg/dL — ABNORMAL HIGH (ref 70–99)

## 2014-05-17 LAB — BRAIN NATRIURETIC PEPTIDE: B NATRIURETIC PEPTIDE 5: 125.4 pg/mL — AB (ref 0.0–100.0)

## 2014-05-17 MED ORDER — FUROSEMIDE 40 MG PO TABS
40.0000 mg | ORAL_TABLET | Freq: Every evening | ORAL | Status: DC
  Administered 2014-05-17: 40 mg via ORAL
  Filled 2014-05-17: qty 1

## 2014-05-17 MED ORDER — FUROSEMIDE 40 MG PO TABS
80.0000 mg | ORAL_TABLET | Freq: Every day | ORAL | Status: DC
  Administered 2014-05-17 – 2014-05-18 (×2): 80 mg via ORAL
  Filled 2014-05-17 (×2): qty 2

## 2014-05-17 NOTE — Progress Notes (Signed)
[facility] PROGRESS NOTE Interim History: Appreciate cardiology. Devon Ortiz is a 49 y.o. male with history of nonischemic cardiac disease with EF of 25-30%, combined systolic and diastolic heart failure(since decreased to 15% according to 2-D echocardiogram done during this admission), hypertension, diabetes mellitus, KKD stage 3 and history of noncompliance with medications who presented to the hospital after being off of his home medication regimen for 2 weeks and he had complaints of worsening edema, productive cough(yellow sputum), and worsening dyspnea on exertion . The concern was for Acute decompensation of CHF. He was placed on Lasix and trial of antibiotics before 2-D echo results. At this point there is no evidence of infection, therefore discontinued antibiotics. Home in 24 hours.  Filed Weights   05/15/14 0618 05/16/14 0505 05/17/14 0445  Weight: 116.62 kg (257 lb 1.6 oz) 115.4 kg (254 lb 6.6 oz) 114.896 kg (253 lb 4.8 oz)        Intake/Output Summary (Last 24 hours) at 05/17/14 1153 Last data filed at 05/17/14 0500  Gross per 24 hour  Intake    840 ml  Output   1500 ml  Net   -660 ml    Assessment/Plan:  Acute combined systolic and systolic CHF  Reported rapid weight gain, shortness of breath and orthopnea.  Last 2-D echo from February 2015 showed LVEF of 30-35% with grade 2 diastolic dysfunction, repeat echo results show EF 15% with global hypokinesis.  -weight close to dry weight (252 on 1/23) -daily weights, low sodium diet and strict intake and output -Per cardiology Lasix switch back to home regimen (  in am and  PM), continue Aldactone and B-blocker. Check BMP in am -Continue home medications including Coreg, Imdur, and hydralazine. Patient not candidate for ARB/ACE inhibitor in view of progressive renal failure -BNP 125  Elevated troponin  -Slightly elevated troponin of 0.04, likely secondary to CHF.  -Patient denies any chest pain, EKG did  not show any acute findings and telemetry stable  Hypokalemia  Resolved.   Noncompliance with medical therapy  -Patient reports that he lost his Medicaid, was not able to pay for his medications since beginning of the month.  -He will need help with meds at d/c.  -Case management to assist as possible. -needs resumption of follow up with heart failure clinic/establishing with heart failure clinic at [facility]   CKD stage III  -Presented with creatinine of 2.2; baseline is unknown (but around 2-2.3 at baseline) -UA/UDS unremarkable.  -BMET in am -Cr is slightly above baseline, most likely with lasix; but good urine output and with stable GFR overall  History of a stroke  -Patient is on Plavix, which will continue.  -no new deficit appreciated  Code Status: Full Family Communication: no family at bedside Disposition Plan: home when medically stable   Consultants:  Cardiology   Procedures: ECHO:  - Left ventricle: Echo density at apex, cannot exclude laminated mural thrombus. The cavity size was mildly dilated. Wall thickness was increased in a pattern of mild LVH. Systolic function was severely reduced. The estimated ejection fraction was in the range of 15% to 20%. Diffuse hypokinesis. Although no diagnostic regional wall motion abnormality was identified, this possibility cannot be completely excluded on the basis of this study. - Mitral valve: There was moderate to severe regurgitation. - Left atrium: The atrium was moderately dilated. - Right ventricle: The cavity size was mildly dilated. Systolic function was mildly to moderately reduced. - Right atrium: The atrium was moderately dilated. - Tricuspid valve:  There was mild-moderate regurgitation. - Pulmonary arteries: Systolic pressure was mildly increased. PA peak pressure: 37 mm Hg (S).  Antibiotics:  None   HPI/Subjective: Feeling better and breathing ok. No fever. No CO. Denies  orthopnea.  Objective: Filed Vitals:   05/16/14 0505 05/16/14 1445 05/16/14 2137 05/17/14 0445  BP: 128/85 113/60 103/65 121/86  Pulse: 69 71 70 71  Temp: 98.3 F (36.8 C) 97.8 F (36.6 C) 98.2 F (36.8 C) 98.1 F (36.7 C)  TempSrc: Axillary Oral Oral Oral  Resp: 20 20 20 20   Height:      Weight: 115.4 kg (254 lb 6.6 oz)   114.896 kg (253 lb 4.8 oz)  SpO2: 100% 96% 97% 99%     Exam:  General: Alert, awake, oriented x3, in no acute distress.  HEENT: No bruits, no goiter; no JVD Heart: Regular rate and rhythm, without rubs or gallops. Positive trace edema bilaterally  Lungs: improved air movement, no frank crackles appreciated. No Wheezing.  Abdomen: Soft, nontender, nondistended, positive bowel sounds.  Neuro: Grossly intact, nonfocal.   Data Reviewed: Basic Metabolic Panel:  Recent Labs Lab 05/12/14 0439 05/14/14 0950 05/15/14 0550 05/16/14 0530 05/17/14 0532  NA 139 137 135 137 136  K 3.7 4.2 3.6 3.8 3.8  CL 106 104 97 95* 95*  CO2 24 24 29 29 30   GLUCOSE 103* 125* 98 100* 101*  BUN 30* 33* 36* 43* 42*  CREATININE 2.06* 2.04* 2.12* 2.40* 2.31*  CALCIUM 8.7 8.9 8.9 9.4 9.2   Liver Function Tests:  Recent Labs Lab 05/12/14 0439  AST 23  ALT 22  ALKPHOS 91  BILITOT 1.7*  PROT 6.4  ALBUMIN 3.4*   BNP (last 3 results)  Recent Labs  06/10/13 1740 06/15/13 0519 06/26/13 1238  PROBNP 3962.0* 426.5* 1941.0*   CBG:  Recent Labs Lab 05/16/14 0746 05/16/14 1237 05/16/14 1740 05/16/14 2135 05/17/14 0720  GLUCAP 125* 98 123* 150* 107*    Studies: No results found.  Scheduled Meds: . atorvastatin  40 mg Oral q1800  . carvedilol  25 mg Oral BID WC  . clopidogrel  75 mg Oral Q breakfast  . furosemide  40 mg Oral QPM  . furosemide  80 mg Oral Daily  . heparin  5,000 Units Subcutaneous 3 times per day  . hydrALAZINE  50 mg Oral 3 times per day  . insulin aspart  0-15 Units Subcutaneous TID WC  . insulin detemir  5 Units Subcutaneous Q2200    . isosorbide mononitrate  15 mg Oral Daily  . pantoprazole  40 mg Oral Daily  . sodium chloride  3 mL Intravenous Q12H  . sodium chloride  3 mL Intravenous Q12H  . spironolactone  25 mg Oral Daily   Continuous Infusions:    Vassie Loll  [facility] Pager [phone]. If 8PM-8AM, please contact night-coverage at [email or web address], password [facility] 05/17/2014, 11:53 AM  LOS: 12 days

## 2014-05-17 NOTE — Care Management Note (Addendum)
    Page 1 of 2   05/17/2014     3:26:18 PM CARE MANAGEMENT NOTE 05/17/2014  Patient:  Devon Ortiz, Devon Ortiz   Account Number:  [id number]  Date Initiated:  05/06/2014  Documentation initiated by:  Trinna Balloon  Subjective/Objective Assessment:   pt admitted with cco SOB, cough, DOE     Action/Plan:   from home   Anticipated DC Date:  05/18/2014   Anticipated DC Plan:  HOME/SELF CARE      DC Planning Services  CM consult  Follow-up appt scheduled  GCCN / P4HM (established/new)  MATCH Program      Choice offered to / List presented to:             Status of service:  In process, will continue to follow Medicare Important Message given?   (If response is "NO", the following Medicare IM given date fields will be blank) Date Medicare IM given:   Medicare IM given by:   Date Additional Medicare IM given:   Additional Medicare IM given by:    Discharge Disposition:    Per UR Regulation:  Reviewed for med. necessity/level of care/duration of stay  If discussed at Long Length of Stay Meetings, dates discussed:    Comments:  05/17/14 Lanier Clam RN BSN NCM [phone] Patient states he has sent in all info to medicaid since his disability was denied,he is appealing. I have contacted his pharmacy-[facility] on [address] tel#[phone]-spoke to Ival Bible can check if medicaid is active w/scripts.Will have weekend CM to f/u tomorrow w/MATCH program.  05/09/14 MMcGibboney, RN, BSN RESOURCE information given to pt for Free or Reduced [employer] in [place].  05/09/14 MMcGibboney, RN, BSN Spoke with pt concerning PCP. Pt given an appointment with [facility], 05/19/14 at 3:30 PM.  05/06/14 MMcGibboney, RN, BSN Chart reviewed. Verifying insurance coverage.

## 2014-05-17 NOTE — Progress Notes (Signed)
    Primary cardiologist: Dr. Marca Ancona (CHF)  Seen for followup: Systolic heart failure  Subjective:    States that he feels better by the day. No chest pain or shortness of breath at rest.  Objective:   Temp:  [97.8 F (36.6 C)-98.2 F (36.8 C)] 98.1 F (36.7 C) (01/23 0445) Pulse Rate:  [70-71] 71 (01/23 0445) Resp:  [20] 20 (01/23 0445) BP: (103-121)/(60-86) 121/86 mmHg (01/23 0445) SpO2:  [96 %-99 %] 99 % (01/23 0445) Weight:  [253 lb 4.8 oz (114.896 kg)] 253 lb 4.8 oz (114.896 kg) (01/23 0445) Last BM Date: 05/17/14  Filed Weights   05/15/14 0618 05/16/14 0505 05/17/14 0445  Weight: 257 lb 1.6 oz (116.62 kg) 254 lb 6.6 oz (115.4 kg) 253 lb 4.8 oz (114.896 kg)    Intake/Output Summary (Last 24 hours) at 05/17/14 0814 Last data filed at 05/17/14 0500  Gross per 24 hour  Intake    840 ml  Output   2100 ml  Net  -1260 ml    Telemetry: Normal sinus rhythm.  Exam:  General: Appears comfortable at rest.  Lungs: Clear, nonlabored.  Cardiac: RRR, indistinct PMI, soft apical systolic murmur.  Abdomen: Protuberant, nontender.  Extremities: Improved leg edema.   Lab  Results:  Basic Metabolic Panel:  Recent Labs Lab 05/15/14 0550 05/16/14 0530 05/17/14 0532  NA 135 137 136  K 3.6 3.8 3.8  CL 97 95* 95*  CO2 29 29 30   GLUCOSE 98 100* 101*  BUN 36* 43* 42*  CREATININE 2.12* 2.40* 2.31*  CALCIUM 8.9 9.4 9.2    Liver Function Tests:  Recent Labs Lab 05/12/14 0439  AST 23  ALT 22  ALKPHOS 91  BILITOT 1.7*  PROT 6.4  ALBUMIN 3.4*    BNP: 125   Medications:   Scheduled Medications: . atorvastatin  40 mg Oral q1800  . carvedilol  25 mg Oral BID WC  . clopidogrel  75 mg Oral Q breakfast  . furosemide  40 mg Intravenous Daily  . heparin  5,000 Units Subcutaneous 3 times per day  . hydrALAZINE  50 mg Oral 3 times per day  . insulin aspart  0-15 Units Subcutaneous TID WC  . insulin detemir  5 Units Subcutaneous Q2200  . isosorbide  mononitrate  15 mg Oral Daily  . pantoprazole  40 mg Oral Daily  . sodium chloride  3 mL Intravenous Q12H  . sodium chloride  3 mL Intravenous Q12H  . spironolactone  25 mg Oral Daily      PRN Medications:  sodium chloride, acetaminophen **OR** acetaminophen, nitroGLYCERIN, sodium chloride   Assessment:   1. Acute on chronic systolic heart failure. Clinically improved.  2. Nonischemic cardiomyopathy, LVEF 15-20%  3. Moderate to severe mitral regurgitation, PASP 37 mmHg by recent echocardiogram.  4. CKD stage 3. Current creatinine 2.3.  5. History of noncompliance.  6. History of stroke, on Plavix.   Plan/Discussion:    Patient has had good diuresis on IV Lasix during hospital stay, weight has come down near reported baseline around 250 pounds. Currently he is on Coreg, hydralazine, Imdur, and Aldactone. We will switch to oral Lasix today at previous outpatient dose. Likely ready for discharge in the next 24 hours. Since he now lives in [place], it will be important for him to establish with a cardiologist/CHF team in that area.   Jonelle Sidle, M.D., F.A.C.C.

## 2014-05-18 LAB — BASIC METABOLIC PANEL WITH GFR
Anion gap: 11 (ref 5–15)
BUN: 47 mg/dL — ABNORMAL HIGH (ref 6–23)
CO2: 28 mmol/L (ref 19–32)
Calcium: 9.2 mg/dL (ref 8.4–10.5)
Chloride: 98 mmol/L (ref 96–112)
Creatinine, Ser: 2.49 mg/dL — ABNORMAL HIGH (ref 0.50–1.35)
GFR calc Af Amer: 34 mL/min — ABNORMAL LOW
GFR calc non Af Amer: 29 mL/min — ABNORMAL LOW
Glucose, Bld: 98 mg/dL (ref 70–99)
Potassium: 3.8 mmol/L (ref 3.5–5.1)
Sodium: 137 mmol/L (ref 135–145)

## 2014-05-18 LAB — GLUCOSE, CAPILLARY
Glucose-Capillary: 95 mg/dL (ref 70–99)
Glucose-Capillary: 102 mg/dL — ABNORMAL HIGH (ref 70–99)

## 2014-05-18 MED ORDER — METOLAZONE 2.5 MG PO TABS: ORAL_TABLET | ORAL | Status: DC

## 2014-05-18 MED ORDER — HYDRALAZINE HCL 25 MG PO TABS: 75.0000 mg | ORAL_TABLET | Freq: Three times a day (TID) | ORAL | Status: DC

## 2014-05-18 MED ORDER — SPIRONOLACTONE 25 MG PO TABS: 12.5000 mg | ORAL_TABLET | Freq: Every day | ORAL | Status: DC

## 2014-05-18 MED ORDER — PANTOPRAZOLE SODIUM 40 MG PO TBEC: 40.0000 mg | DELAYED_RELEASE_TABLET | Freq: Every day | ORAL | Status: DC

## 2014-05-18 MED ORDER — ATORVASTATIN CALCIUM 40 MG PO TABS: 40.0000 mg | ORAL_TABLET | Freq: Every day | ORAL | Status: DC

## 2014-05-18 MED ORDER — FUROSEMIDE 80 MG PO TABS: ORAL_TABLET | ORAL | Status: DC

## 2014-05-18 MED ORDER — CARVEDILOL 25 MG PO TABS: 25.0000 mg | ORAL_TABLET | Freq: Two times a day (BID) | ORAL | Status: DC

## 2014-05-18 MED ORDER — CLOPIDOGREL BISULFATE 75 MG PO TABS: 75.0000 mg | ORAL_TABLET | Freq: Every day | ORAL | Status: DC

## 2014-05-18 MED ORDER — POTASSIUM CHLORIDE CRYS ER 20 MEQ PO TBCR: 20.0000 meq | EXTENDED_RELEASE_TABLET | Freq: Every day | ORAL | Status: DC

## 2014-05-18 NOTE — Progress Notes (Signed)
05/18/2014 1345 Provided pt with MATCH letter. Medicaid is not active. Isidoro Donning RN CCM Case Mgmt phone [phone]

## 2014-05-18 NOTE — Progress Notes (Signed)
    Primary cardiologist: Dr. Marca Ancona (CHF)  Seen for followup: Systolic heart failure  Subjective:    Eating breakfast. Feels better today. No chest pain.  Objective:   Temp:  [97.6 F (36.4 C)-98 F (36.7 C)] 98 F (36.7 C) (01/24 0538) Pulse Rate:  [71-74] 74 (01/24 0538) Resp:  [18-22] 20 (01/24 0538) BP: (110-130)/(67-79) 130/79 mmHg (01/24 0538) SpO2:  [96 %-97 %] 97 % (01/24 0538) Weight:  [254 lb (115.214 kg)] 254 lb (115.214 kg) (01/24 0538) Last BM Date: 05/17/14  Filed Weights   05/16/14 0505 05/17/14 0445 05/18/14 0538  Weight: 254 lb 6.6 oz (115.4 kg) 253 lb 4.8 oz (114.896 kg) 254 lb (115.214 kg)    Intake/Output Summary (Last 24 hours) at 05/18/14 0821 Last data filed at 05/18/14 0541  Gross per 24 hour  Intake    600 ml  Output   1225 ml  Net   -625 ml    Telemetry: Normal sinus rhythm.  Exam:  General: Appears comfortable at rest.  Lungs: Clear, nonlabored.  Cardiac: RRR, indistinct PMI, soft apical systolic murmur.  Abdomen: Protuberant, nontender.  Extremities: Improved leg edema.   Lab  Results:  Basic Metabolic Panel:  Recent Labs Lab 05/16/14 0530 05/17/14 0532 05/18/14 0521  NA 137 136 137  K 3.8 3.8 3.8  CL 95* 95* 98  CO2 29 30 28   GLUCOSE 100* 101* 98  BUN 43* 42* 47*  CREATININE 2.40* 2.31* 2.49*  CALCIUM 9.4 9.2 9.2    Liver Function Tests:  Recent Labs Lab 05/12/14 0439  AST 23  ALT 22  ALKPHOS 91  BILITOT 1.7*  PROT 6.4  ALBUMIN 3.4*    BNP: 125   Medications:   Scheduled Medications: . atorvastatin  40 mg Oral q1800  . carvedilol  25 mg Oral BID WC  . clopidogrel  75 mg Oral Q breakfast  . furosemide  40 mg Oral QPM  . furosemide  80 mg Oral Daily  . heparin  5,000 Units Subcutaneous 3 times per day  . hydrALAZINE  50 mg Oral 3 times per day  . insulin aspart  0-15 Units Subcutaneous TID WC  . insulin detemir  5 Units Subcutaneous Q2200  . isosorbide mononitrate  15 mg Oral Daily  .  pantoprazole  40 mg Oral Daily  . sodium chloride  3 mL Intravenous Q12H  . sodium chloride  3 mL Intravenous Q12H  . spironolactone  25 mg Oral Daily    PRN Medications: sodium chloride, acetaminophen **OR** acetaminophen, nitroGLYCERIN, sodium chloride   Assessment:   1. Acute on chronic systolic heart failure. Clinically improved.  2. Nonischemic cardiomyopathy, LVEF 15-20%  3. Moderate to severe mitral regurgitation, PASP 37 mmHg by recent echocardiogram.  4. CKD stage 3. Current creatinine 2.3.  5. History of noncompliance.  6. History of stroke, on Plavix.   Plan/Discussion:    Continue Coreg, hydralazine, Imdur, and Aldactone. Back on oral Lasix at previous outpatient dose. Feels ready for discharge. Since he now lives in [place], it will be important for him to establish with a cardiologist/CHF team in that area. Ideally, he should be seen by a provider within 7 days of discharge.   Jonelle Sidle, M.D., F.A.C.C.

## 2014-05-18 NOTE — Progress Notes (Signed)
05/18/2014 1200 Faxed Rx to [facility]. Spoke to pt and made him aware if Medicaid is not active. Match program available for one use per year with $3 copay for meds. Isidoro Donning RN CCM Case Mgmt phone [phone]

## 2014-05-18 NOTE — Discharge Summary (Signed)
Physician Discharge Summary  Killian Schwer WLN:[id number] DOB: [birth year] DOA: 05/05/2014  PCP: Philis Fendt, MD  Admit date: 05/05/2014 Discharge date: 05/18/2014  Time spent: 30 minutes  Recommendations for Outpatient Follow-up:  Check BMET to follow electrolytes and renal function Please assist with medications Reassess BP and adjust medications as needed Needs follow up with heart failure clinic and to establish care at [facility] eventually  Discharge Diagnoses:  Principal Problem:   Acute on chronic combined systolic and diastolic congestive heart failure Active Problems:   Hypokalemia   H/O noncompliance with medical treatment, presenting hazards to health   Non-ischemic cardiomyopathy - EF 25-30% Sept 2014   Chronic combined systolic and diastolic HF (heart failure), NYHA class 2   CKD (chronic kidney disease), stage III   Stroke   Hypertensive heart disease   Noncompliance with medication regimen   Shortness of breath   AKI (acute kidney injury)   Uncontrolled hypertension   Discharge Condition: stable and improved. Will discharge home. Follow up with wellness center on 1/25 and will contact heart failure clinic to set up appointment in 1 week (discussed with Dr. Domenic Polite)  Diet recommendation: low sodium/heart healthy diet  Filed Weights   05/16/14 0505 05/17/14 0445 05/18/14 0538  Weight: 115.4 kg (254 lb 6.6 oz) 114.896 kg (253 lb 4.8 oz) 115.214 kg (254 lb)    History of present illness:  Devon Ortiz is a 49 y.o. male with history of nonischemic cardiac disease with EF of 74-08%, combined systolic and diastolic heart failure(since decreased to 15% according to 2-D echocardiogram done during this admission), hypertension, diabetes mellitus, KKD stage 3 and history of noncompliance with medications who presented to the hospital after being off of his home medication regimen for 2 weeks and he had complaints of worsening edema, productive cough(yellow sputum),  and worsening dyspnea on exertion . The concern was for Acute decompensation of CHF.   Hospital Course:  Acute combined systolic and systolic CHF  Reported rapid weight gain, shortness of breath and orthopnea.  Last 2-D echo from February 2015 showed LVEF of 30-35% with grade 2 diastolic dysfunction, repeat echo results show EF 15% with global hypokinesis.  -needs to establish care with heart failure clinic at [place]; meanwhile will continue following with Dr. Benjamine Mola at [facility] heart failure clinic. -weight close to dry weight which is 250 pounds (252-253 at discharge) -daily weights, low sodium diet and strict intake and output -Per cardiology Lasix switch back to home regimen (86m in am and 473mPM), continue Aldactone and B-blocker. Check BMP during follow up appointment -for weight gain will use metolazone. -Continue home medications including Coreg, Imdur, aldactone and hydralazine. Patient not candidate for ARB/ACE inhibitor in view of progressive renal failure. Once stable these medications can be reviewed try it as well. -BNP 125 at discharge  Elevated troponin  -Slightly elevated troponin of 0.04, likely secondary to CHF.  -Patient denies any chest pain, EKG did not show any acute findings and telemetry stable  Hypokalemia  -Resolved.  -will discharge on maintenance supplementation  Noncompliance with medical therapy  -Patient reports that he lost his Medicaid, was not able to pay for his medications since beginning of the month.  -He will need help with meds at d/c.  -Case management to assist as possible. -needs resumption of follow up with heart failure clinic/establishing with heart failure clinic at [facility] CKD stage III  -Presented with creatinine of 2.2; baseline is unknown (but around 2-2.3 at baseline) -UA/UDS unremarkable.  -  BMET to be follow closely in outpatient setting -Cr is slightly above baseline (2.4), most likely with use of lasix; but good  urine output and with stable GFR overall  History of a stroke  -Patient is on Plavix, which will be continued.  -no new deficit appreciated   Procedures:  ECHO:  - Left ventricle: Echo density at apex, cannot exclude laminated mural thrombus. The cavity size was mildly dilated. Wall thickness was increased in a pattern of mild LVH. Systolic function was severely reduced. The estimated ejection fraction was in the range of 15% to 20%. Diffuse hypokinesis. Although no diagnostic regional wall motion abnormality was identified, this possibility cannot be completely excluded on the basis of this study. - Mitral valve: There was moderate to severe regurgitation. - Left atrium: The atrium was moderately dilated. - Right ventricle: The cavity size was mildly dilated. Systolic function was mildly to moderately reduced. - Right atrium: The atrium was moderately dilated. - Tricuspid valve: There was mild-moderate regurgitation. - Pulmonary arteries: Systolic pressure was mildly increased. PA peak pressure: 37 mm Hg (S).  Consultations:  Cardiology   Discharge Exam: Filed Vitals:   05/18/14 0538  BP: 130/79  Pulse: 74  Temp: 98 F (36.7 C)  Resp: 20   General: Alert, awake, oriented x3, in no acute distress.  HEENT: No bruits, no goiter; no JVD Heart: Regular rate and rhythm, without rubs or gallops. Positive trace edema bilaterally  Lungs: improved air movement, no frank crackles appreciated. No Wheezing.  Abdomen: Soft, nontender, nondistended, positive bowel sounds.  Neuro: Grossly intact, nonfocal.  Discharge Instructions  Discharge Instructions    Diet - low sodium heart healthy    Complete by:  As directed      Discharge instructions    Complete by:  As directed   Follow a low sodium diet (less than 2 gram daily) Take medications as prescribed Follow at wellness center as instructed to follow renal function, medication assistance and to  establish care while transitioning to [place] your weight on daily basis (if yo notice increase 3 pounds overnight and/or 5 pounds in 1 week, please take metolazone will required you to contact cardiology office for further instructions. Heart failure clinic will contact you with appointment details (visit will be arranged for 2 weeks or so)          Current Discharge Medication List    START taking these medications   Details  metolazone (ZAROXOLYN) 2.5 MG tablet Take 1 tablet by mouth 30 minutes before lasix if you gained 3 pounds overnight and/or 5 pound in 1 week.Otho Darner: 30 tablet, Refills: 0      CONTINUE these medications which have CHANGED   Details  atorvastatin (LIPITOR) 40 MG tablet Take 1 tablet (40 mg total) by mouth daily at 6 PM. Qty: 30 tablet, Refills: 0    carvedilol (COREG) 25 MG tablet Take 1 tablet (25 mg total) by mouth 2 (two) times daily with a meal. Qty: 60 tablet, Refills: 1    clopidogrel (PLAVIX) 75 MG tablet Take 1 tablet (75 mg total) by mouth daily with breakfast. Qty: 30 tablet, Refills: 1    furosemide (LASIX) 80 MG tablet Take 1 tab in AM and 1/2 tab in PM Qty: 60 tablet, Refills: 1    hydrALAZINE (APRESOLINE) 25 MG tablet Take 3 tablets (75 mg total) by mouth every 8 (eight) hours. Qty: 270 tablet, Refills: 0    pantoprazole (PROTONIX) 40 MG tablet Take 1  tablet (40 mg total) by mouth daily. Qty: 30 tablet, Refills: 1    potassium chloride SA (K-DUR,KLOR-CON) 20 MEQ tablet Take 1 tablet (20 mEq total) by mouth daily. Qty: 30 tablet, Refills: 3    spironolactone (ALDACTONE) 25 MG tablet Take 0.5 tablets (12.5 mg total) by mouth daily. Qty: 30 tablet, Refills: 1      CONTINUE these medications which have NOT CHANGED   Details  Blood Glucose Monitoring Suppl (BLOOD GLUCOSE METER) kit Use as instructed Qty: 1 each, Refills: 0    Insulin Detemir (LEVEMIR) 100 UNIT/ML Pen Inject 5 Units into the skin daily at 10 pm. Qty:  15 mL, Refills: 11    isosorbide mononitrate (IMDUR) 30 MG 24 hr tablet Take 0.5 tablets (15 mg total) by mouth daily. Qty: 15 tablet, Refills: 0    Multiple Vitamin (MULTIVITAMIN WITH MINERALS) TABS Take 1 tablet by mouth daily.      STOP taking these medications     amLODipine (NORVASC) 10 MG tablet        Allergies  Allergen Reactions  . Tomato Rash   Follow-up Information    Follow up with [facility]     On 05/19/2014.   Why:  appointment at 3:30 PM, arrive 15 mins before. Take Photo ID, all medications, and Discharge papers with you.    Contact information:   [address] [place] [id number] [phone]      Follow up with [facility].   Specialty:  Cardiology   Why:  in 1 week for appointment details   Contact information:   [address] [id number] [place] [phone]      The results of significant diagnostics from this hospitalization (including imaging, microbiology, ancillary and laboratory) are listed below for reference.    Significant Diagnostic Studies: Dg Chest 2 View  05/05/2014   CLINICAL DATA:  Progressive shortness of breath since 02/16/2014  EXAM: CHEST  2 VIEW  COMPARISON:  06/10/2013 hand 12/26/2012  FINDINGS: There is chronic cardiomegaly. There is slight pulmonary vascular prominence. There is chronic accentuation of the interstitial markings. No effusions. No acute osseous abnormality.  IMPRESSION: Chronic cardiomegaly with slight pulmonary vascular congestion.   Electronically Signed   By: Rozetta Nunnery M.D.   On: 05/05/2014 19:51    Labs: Basic Metabolic Panel:  Recent Labs Lab 05/14/14 0950 05/15/14 0550 05/16/14 0530 05/17/14 0532 05/18/14 0521  NA 137 135 137 136 137  K 4.2 3.6 3.8 3.8 3.8  CL 104 97 95* 95* 98  CO2 _0 GLUCOSE 125* 98 100* 101* 98  BUN 33* 36* 43* 42* 47*  CREATININE  2.04* 2.12* 2.40* 2.31* 2.49*  CALCIUM 8.9 8.9 9.4 9.2 9.2   Liver Function Tests:  Recent Labs Lab 05/12/14 0439  AST 23  ALT 22  ALKPHOS 91  BILITOT 1.7*  PROT 6.4  ALBUMIN 3.4*   BNP (last 3 results)  Recent Labs  06/10/13 1740 06/15/13 0519 06/26/13 1238  PROBNP 3962.0* 426.5* 1941.0*   CBG:  Recent Labs Lab 05/17/14 0720 05/17/14 1127 05/17/14 1632 05/17/14 2117 05/18/14 0740  GLUCAP 107* 116* 166* 168* 95     Signed:  Barton Dubois  [facility] 05/18/2014, 10:05 AM

## 2014-05-19 ENCOUNTER — Ambulatory Visit: Payer: Medicaid Other | Attending: Family Medicine | Admitting: Family Medicine

## 2014-05-19 ENCOUNTER — Encounter: Payer: Self-pay | Admitting: Family Medicine

## 2014-05-19 VITALS — BP 123/82 | HR 90 | Temp 98.1°F | Resp 20 | Ht 68.0 in | Wt 254.6 lb

## 2014-05-19 DIAGNOSIS — I429 Cardiomyopathy, unspecified: Secondary | ICD-10-CM

## 2014-05-19 DIAGNOSIS — E1165 Type 2 diabetes mellitus with hyperglycemia: Secondary | ICD-10-CM

## 2014-05-19 DIAGNOSIS — I428 Other cardiomyopathies: Secondary | ICD-10-CM

## 2014-05-19 DIAGNOSIS — R0602 Shortness of breath: Secondary | ICD-10-CM

## 2014-05-19 DIAGNOSIS — I5042 Chronic combined systolic (congestive) and diastolic (congestive) heart failure: Secondary | ICD-10-CM | POA: Insufficient documentation

## 2014-05-19 DIAGNOSIS — E1121 Type 2 diabetes mellitus with diabetic nephropathy: Secondary | ICD-10-CM

## 2014-05-19 DIAGNOSIS — I5043 Acute on chronic combined systolic (congestive) and diastolic (congestive) heart failure: Secondary | ICD-10-CM

## 2014-05-19 LAB — POCT GLYCOSYLATED HEMOGLOBIN (HGB A1C): Hemoglobin A1C: 5.7

## 2014-05-19 LAB — GLUCOSE, POCT (MANUAL RESULT ENTRY): POC Glucose: 180 mg/dl — AB (ref 70–99)

## 2014-05-19 MED ORDER — FUROSEMIDE 80 MG PO TABS: ORAL_TABLET | ORAL | Status: DC

## 2014-05-19 MED ORDER — PANTOPRAZOLE SODIUM 40 MG PO TBEC: 40.0000 mg | DELAYED_RELEASE_TABLET | Freq: Every day | ORAL | Status: DC

## 2014-05-19 MED ORDER — ATORVASTATIN CALCIUM 40 MG PO TABS: 40.0000 mg | ORAL_TABLET | Freq: Every day | ORAL | Status: DC

## 2014-05-19 MED ORDER — SPIRONOLACTONE 25 MG PO TABS: 12.5000 mg | ORAL_TABLET | Freq: Every day | ORAL | Status: DC

## 2014-05-19 MED ORDER — POTASSIUM CHLORIDE CRYS ER 20 MEQ PO TBCR: 20.0000 meq | EXTENDED_RELEASE_TABLET | Freq: Every day | ORAL | Status: DC

## 2014-05-19 MED ORDER — ISOSORBIDE MONONITRATE ER 30 MG PO TB24: 15.0000 mg | ORAL_TABLET | Freq: Every day | ORAL | Status: DC

## 2014-05-19 MED ORDER — HYDRALAZINE HCL 25 MG PO TABS: 75.0000 mg | ORAL_TABLET | Freq: Three times a day (TID) | ORAL | Status: DC

## 2014-05-19 MED ORDER — METOLAZONE 2.5 MG PO TABS: ORAL_TABLET | ORAL | Status: DC

## 2014-05-19 MED ORDER — CARVEDILOL 25 MG PO TABS: 25.0000 mg | ORAL_TABLET | Freq: Two times a day (BID) | ORAL | Status: DC

## 2014-05-19 NOTE — Assessment & Plan Note (Signed)
A: BP at goal. No hypotension. No symptoms of decompensation P: Filled all medications in office  BMP in 3 days  Patient to scheduled f/u at CHF clinic.  

## 2014-05-19 NOTE — Assessment & Plan Note (Signed)
Resolved

## 2014-05-19 NOTE — Progress Notes (Signed)
Patient presents as HFU for CHF; d/c yesterday Establishing care for T2DM, HTN, hyperlipidemia Has RXs from hosp discharge but has not yet filled Taking no meds at present Never smoked

## 2014-05-19 NOTE — Patient Instructions (Addendum)
Mr. Devon Ortiz,  Thank you for coming in today. It was a pleasure meeting you. I look forward to being your primary doctor.   Please pick up all medications at the pharmacy. Return on Thursday for blood work. Call and keep cardiology appointment next week.  No insulin needed for now.   F/u with me in 1 month  Dr. Armen Pickup

## 2014-05-19 NOTE — Progress Notes (Signed)
   Subjective:    Patient ID: Devon Ortiz, male    DOB: 03-30-1966, 49 y.o.   MRN: [id number] CC: establish care HFU for CHF HPI 49 yo M NP:  1. HFU CHF: doing well. No CP or SOB. Sleeps with 2 pillows. Has not had medications since hospital discharge yesterday. Has medications in hands. Also has MATCH (medication assistance) letter.    Soc Hx: non smoker  Med Hx: CAD  Surg Hx: cardiac cath  Review of Systems As per HPI     Objective:   Physical Exam BP 123/82 mmHg  Pulse 90  Temp(Src) 98.1 F (36.7 C) (Oral)  Resp 20  Ht 5\' 8"  (1.727 m)  Wt 254 lb 9.6 oz (115.486 kg)  BMI 38.72 kg/m2  SpO2 97%  Wt Readings from Last 3 Encounters:  05/19/14 254 lb 9.6 oz (115.486 kg)  05/18/14 254 lb (115.214 kg)  06/26/13 260 lb 4 oz (118.049 kg)  General appearance: alert, cooperative and no distress Lungs: clear to auscultation bilaterally  Back: soft tissue mass r upper back, non tender  Heart: regular rate and rhythm, S1, S2 normal, no murmur, click, rub or gallop Extremities: edema trace at ankles only.   Lab Results  Component Value Date   HGBA1C 6.6* 06/12/2013   CBG: 180     Assessment & Plan:

## 2014-05-19 NOTE — Assessment & Plan Note (Signed)
A: A1c 5.7. No hyperglycemia P: D/c levemir due to risk of hypoglycemia F/u in 1 month for CBG monitoring in office and will discuss home CBG monitoring

## 2014-05-19 NOTE — Assessment & Plan Note (Addendum)
A: BP at goal. No hypotension. No symptoms of decompensation P: Filled all medications in office  BMP in 3 days  Patient to scheduled f/u at CHF clinic.

## 2014-05-22 ENCOUNTER — Other Ambulatory Visit: Payer: Medicaid Other

## 2014-05-23 ENCOUNTER — Telehealth (HOSPITAL_COMMUNITY): Payer: Self-pay | Admitting: Vascular Surgery

## 2014-05-27 NOTE — Telephone Encounter (Signed)
Open in error

## 2014-05-30 ENCOUNTER — Telehealth (HOSPITAL_COMMUNITY): Payer: Self-pay | Admitting: Vascular Surgery

## 2014-06-04 ENCOUNTER — Encounter (HOSPITAL_COMMUNITY): Payer: Self-pay | Admitting: Vascular Surgery

## 2014-08-06 ENCOUNTER — Ambulatory Visit: Payer: Self-pay | Attending: Family Medicine | Admitting: Family Medicine

## 2014-08-06 ENCOUNTER — Ambulatory Visit (HOSPITAL_BASED_OUTPATIENT_CLINIC_OR_DEPARTMENT_OTHER): Payer: Self-pay | Admitting: Family Medicine

## 2014-08-06 VITALS — BP 196/147 | HR 97 | Temp 98.4°F | Resp 16 | Ht 68.0 in | Wt 273.6 lb

## 2014-08-06 VITALS — BP 159/116 | HR 76

## 2014-08-06 DIAGNOSIS — N183 Chronic kidney disease, stage 3 unspecified: Secondary | ICD-10-CM

## 2014-08-06 DIAGNOSIS — I5043 Acute on chronic combined systolic (congestive) and diastolic (congestive) heart failure: Secondary | ICD-10-CM | POA: Insufficient documentation

## 2014-08-06 DIAGNOSIS — I429 Cardiomyopathy, unspecified: Secondary | ICD-10-CM

## 2014-08-06 DIAGNOSIS — I10 Essential (primary) hypertension: Secondary | ICD-10-CM

## 2014-08-06 DIAGNOSIS — E1121 Type 2 diabetes mellitus with diabetic nephropathy: Secondary | ICD-10-CM | POA: Insufficient documentation

## 2014-08-06 DIAGNOSIS — I428 Other cardiomyopathies: Secondary | ICD-10-CM

## 2014-08-06 DIAGNOSIS — I129 Hypertensive chronic kidney disease with stage 1 through stage 4 chronic kidney disease, or unspecified chronic kidney disease: Secondary | ICD-10-CM | POA: Insufficient documentation

## 2014-08-06 LAB — BASIC METABOLIC PANEL
BUN: 13 mg/dL (ref 6–23)
CALCIUM: 8.6 mg/dL (ref 8.4–10.5)
CO2: 27 mEq/L (ref 19–32)
CREATININE: 1.39 mg/dL — AB (ref 0.50–1.35)
Chloride: 105 mEq/L (ref 96–112)
GLUCOSE: 102 mg/dL — AB (ref 70–99)
Potassium: 3.7 mEq/L (ref 3.5–5.3)
Sodium: 140 mEq/L (ref 135–145)

## 2014-08-06 MED ORDER — ATORVASTATIN CALCIUM 40 MG PO TABS: 40.0000 mg | ORAL_TABLET | Freq: Every day | ORAL | Status: AC

## 2014-08-06 MED ORDER — POTASSIUM CHLORIDE CRYS ER 20 MEQ PO TBCR: 20.0000 meq | EXTENDED_RELEASE_TABLET | Freq: Every day | ORAL | Status: DC

## 2014-08-06 MED ORDER — SPIRONOLACTONE 25 MG PO TABS: 12.5000 mg | ORAL_TABLET | Freq: Every day | ORAL | Status: DC

## 2014-08-06 MED ORDER — HYDRALAZINE HCL 25 MG PO TABS: 75.0000 mg | ORAL_TABLET | Freq: Three times a day (TID) | ORAL | Status: AC

## 2014-08-06 MED ORDER — FUROSEMIDE 80 MG PO TABS: ORAL_TABLET | ORAL | Status: DC

## 2014-08-06 MED ORDER — FUROSEMIDE BOLUS VIA INFUSION: 40.0000 mg | Freq: Once | INTRAVENOUS | Status: AC

## 2014-08-06 MED ORDER — CLONIDINE HCL 0.1 MG PO TABS
0.2000 mg | ORAL_TABLET | Freq: Once | ORAL | Status: AC
  Administered 2014-08-06: 0.2 mg via ORAL

## 2014-08-06 MED ORDER — FUROSEMIDE 10 MG/ML IJ SOLN
40.0000 mg | Freq: Once | INTRAMUSCULAR | Status: AC
  Administered 2014-08-06: 40 mg via INTRAVENOUS

## 2014-08-06 MED ORDER — POTASSIUM CHLORIDE CRYS ER 20 MEQ PO TBCR: 20.0000 meq | EXTENDED_RELEASE_TABLET | Freq: Every day | ORAL | Status: AC

## 2014-08-06 MED ORDER — ISOSORBIDE MONONITRATE ER 30 MG PO TB24: 15.0000 mg | ORAL_TABLET | Freq: Every day | ORAL | Status: DC

## 2014-08-06 MED ORDER — SPIRONOLACTONE 25 MG PO TABS: 12.5000 mg | ORAL_TABLET | Freq: Every day | ORAL | Status: AC

## 2014-08-06 MED ORDER — HYDRALAZINE HCL 25 MG PO TABS: 75.0000 mg | ORAL_TABLET | Freq: Three times a day (TID) | ORAL | Status: DC

## 2014-08-06 MED ORDER — CARVEDILOL 25 MG PO TABS: 25.0000 mg | ORAL_TABLET | Freq: Two times a day (BID) | ORAL | Status: AC

## 2014-08-06 MED ORDER — ATORVASTATIN CALCIUM 40 MG PO TABS: 40.0000 mg | ORAL_TABLET | Freq: Every day | ORAL | Status: DC

## 2014-08-06 MED ORDER — CARVEDILOL 25 MG PO TABS: 25.0000 mg | ORAL_TABLET | Freq: Two times a day (BID) | ORAL | Status: DC

## 2014-08-06 MED ORDER — ISOSORBIDE MONONITRATE ER 30 MG PO TB24: 15.0000 mg | ORAL_TABLET | Freq: Every day | ORAL | Status: AC

## 2014-08-06 MED ORDER — PANTOPRAZOLE SODIUM 40 MG PO TBEC: 40.0000 mg | DELAYED_RELEASE_TABLET | Freq: Every day | ORAL | Status: AC

## 2014-08-06 NOTE — Progress Notes (Signed)
Patient presents to clinic after being seen at disability appointment.  His blood pressure was extremely high he cannot remember what it was but was told to come here to be seen.  He reports he is out of all medications and has not taken his medication in 3 days.  Patient states he has recently moved to [facility] and will be transferring his care to a new clinic. Patient says his weight yesterday was 269.8 lb and today here is is 273.6
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
## 2014-08-06 NOTE — Patient Instructions (Signed)

## 2014-08-07 LAB — BRAIN NATRIURETIC PEPTIDE: Brain Natriuretic Peptide: 628.8 pg/mL — ABNORMAL HIGH (ref 0.0–100.0)

## 2014-08-08 ENCOUNTER — Ambulatory Visit: Payer: Self-pay | Attending: Family Medicine | Admitting: Family Medicine

## 2014-08-08 ENCOUNTER — Encounter: Payer: Self-pay | Admitting: Family Medicine

## 2014-08-08 VITALS — BP 148/94 | HR 70 | Temp 98.0°F | Resp 18 | Ht 68.0 in | Wt 267.0 lb

## 2014-08-08 DIAGNOSIS — E1121 Type 2 diabetes mellitus with diabetic nephropathy: Secondary | ICD-10-CM

## 2014-08-08 DIAGNOSIS — I5043 Acute on chronic combined systolic (congestive) and diastolic (congestive) heart failure: Secondary | ICD-10-CM | POA: Insufficient documentation

## 2014-08-08 DIAGNOSIS — N1831 Chronic kidney disease, stage 3a: Secondary | ICD-10-CM

## 2014-08-08 DIAGNOSIS — N183 Chronic kidney disease, stage 3 (moderate): Secondary | ICD-10-CM | POA: Insufficient documentation

## 2014-08-08 DIAGNOSIS — I429 Cardiomyopathy, unspecified: Secondary | ICD-10-CM

## 2014-08-08 DIAGNOSIS — I10 Essential (primary) hypertension: Secondary | ICD-10-CM | POA: Insufficient documentation

## 2014-08-08 DIAGNOSIS — I428 Other cardiomyopathies: Secondary | ICD-10-CM

## 2014-08-08 DIAGNOSIS — E119 Type 2 diabetes mellitus without complications: Secondary | ICD-10-CM | POA: Insufficient documentation

## 2014-08-08 LAB — BASIC METABOLIC PANEL
BUN: 17 mg/dL (ref 6–23)
CALCIUM: 8.8 mg/dL (ref 8.4–10.5)
CHLORIDE: 102 meq/L (ref 96–112)
CO2: 26 mEq/L (ref 19–32)
CREATININE: 1.69 mg/dL — AB (ref 0.50–1.35)
Glucose, Bld: 111 mg/dL — ABNORMAL HIGH (ref 70–99)
Potassium: 3.5 mEq/L (ref 3.5–5.3)
SODIUM: 140 meq/L (ref 135–145)

## 2014-08-08 LAB — GLUCOSE, POCT (MANUAL RESULT ENTRY): POC Glucose: 116 mg/dl — AB (ref 70–99)

## 2014-08-08 MED ORDER — METOLAZONE 2.5 MG PO TABS: ORAL_TABLET | ORAL | Status: DC
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
## 2014-08-08 NOTE — Addendum Note (Signed)
Addended by: Elpidio Eric A on: 08/08/2014 10:22 AM   Modules accepted: Orders

## 2014-08-08 NOTE — Progress Notes (Signed)
Patient here for BP follow up.

## 2014-08-08 NOTE — Patient Instructions (Signed)

## 2014-08-08 NOTE — Addendum Note (Signed)
Addended by: Jaclyn Shaggy on: 08/08/2014 10:04 AM   Modules accepted: Level of Service

## 2014-08-10 LAB — BRAIN NATRIURETIC PEPTIDE: BRAIN NATRIURETIC PEPTIDE: 276.1 pg/mL — AB (ref 0.0–100.0)

## 2014-08-12 ENCOUNTER — Telehealth: Payer: Self-pay

## 2014-08-12 ENCOUNTER — Encounter: Payer: Self-pay | Admitting: Family Medicine

## 2014-08-12 NOTE — Telephone Encounter (Signed)
-----   Message from Enobong Amao, MD sent at 08/11/2014 12:14 PM EDT ----- CHF has improved clinically and objectively evidenced by reduced BNP, creatinine has increased a bit due recent use of Lasix which is expected; will monitor. Thanks 

## 2014-08-12 NOTE — Telephone Encounter (Signed)
Nurse called patient, reached automated voicemail. Left nurses name and contact number only.

## 2014-08-13 NOTE — Telephone Encounter (Signed)
Nurse called patient to report Dr. Rory Percy findings. Nurse left message for patient to return call to [facility].

## 2014-08-13 NOTE — Telephone Encounter (Signed)
-----   Message from Enobong Amao, MD sent at 08/11/2014 12:14 PM EDT ----- CHF has improved clinically and objectively evidenced by reduced BNP, creatinine has increased a bit due recent use of Lasix which is expected; will monitor. Thanks 

## 2014-08-14 NOTE — Telephone Encounter (Signed)
-----   Message from Enobong Amao, MD sent at 08/11/2014 12:14 PM EDT ----- CHF has improved clinically and objectively evidenced by reduced BNP, creatinine has increased a bit due recent use of Lasix which is expected; will monitor. Thanks 

## 2014-08-14 NOTE — Telephone Encounter (Signed)
Nurse called patient at mobile number, Nurse did not leave message because voicemail recording did not include patients name or telephone number. Nurse will return call at later time.

## 2014-08-14 NOTE — Telephone Encounter (Signed)
Nurse called patient, reached voicemail. Left message for patient to call Devon Ortiz at [phone].   

## 2014-08-14 NOTE — Telephone Encounter (Signed)
-----   Message from Jaclyn Shaggy, MD sent at 08/11/2014 12:14 PM EDT ----- CHF has improved clinically and objectively evidenced by reduced BNP, creatinine has increased a bit due recent use of Lasix which is expected; will monitor. Thanks

## 2014-08-15 NOTE — Telephone Encounter (Signed)
-----   Message from Enobong Amao, MD sent at 08/11/2014 12:14 PM EDT ----- CHF has improved clinically and objectively evidenced by reduced BNP, creatinine has increased a bit due recent use of Lasix which is expected; will monitor. Thanks 

## 2014-08-15 NOTE — Telephone Encounter (Signed)
Nurse called patient, Patient verified date of birth. Patient aware that CHF has improved as evidenced by BNP and creatinine has increased a bit due to recent use of lasix, which is expected. Patient voices understanding.

## 2014-10-02 NOTE — Telephone Encounter (Signed)
Open in error

## 2014-10-20 ENCOUNTER — Other Ambulatory Visit: Payer: Self-pay

## 2016-10-23 HISTORY — PX: RIGHT HEART CATH: CATH118263

## 2017-03-25 HISTORY — PX: TRANSTHORACIC ECHOCARDIOGRAM: SHX275

## 2017-03-27 HISTORY — PX: ATRIAL FLUTTER ABLATION: SHX5733

## 2017-06-15 ENCOUNTER — Ambulatory Visit: Payer: Self-pay | Admitting: Cardiology

## 2017-06-15 ENCOUNTER — Ambulatory Visit (INDEPENDENT_AMBULATORY_CARE_PROVIDER_SITE_OTHER): Payer: Medicare Other | Admitting: Cardiology

## 2017-06-15 ENCOUNTER — Encounter: Payer: Self-pay | Admitting: Cardiology

## 2017-06-15 VITALS — BP 152/98 | HR 84 | Ht 67.0 in | Wt 325.2 lb

## 2017-06-15 DIAGNOSIS — G4733 Obstructive sleep apnea (adult) (pediatric): Secondary | ICD-10-CM | POA: Diagnosis not present

## 2017-06-15 DIAGNOSIS — N19 Unspecified kidney failure: Secondary | ICD-10-CM

## 2017-06-15 DIAGNOSIS — E1121 Type 2 diabetes mellitus with diabetic nephropathy: Secondary | ICD-10-CM

## 2017-06-15 DIAGNOSIS — I428 Other cardiomyopathies: Secondary | ICD-10-CM | POA: Diagnosis not present

## 2017-06-15 DIAGNOSIS — I10 Essential (primary) hypertension: Secondary | ICD-10-CM | POA: Diagnosis not present

## 2017-06-15 DIAGNOSIS — I1310 Hypertensive heart and chronic kidney disease without heart failure, with stage 1 through stage 4 chronic kidney disease, or unspecified chronic kidney disease: Secondary | ICD-10-CM | POA: Diagnosis not present

## 2017-06-15 DIAGNOSIS — I5042 Chronic combined systolic (congestive) and diastolic (congestive) heart failure: Secondary | ICD-10-CM | POA: Diagnosis not present

## 2017-06-15 DIAGNOSIS — Z91199 Patient's noncompliance with other medical treatment and regimen due to unspecified reason: Secondary | ICD-10-CM

## 2017-06-15 DIAGNOSIS — Z9119 Patient's noncompliance with other medical treatment and regimen: Secondary | ICD-10-CM | POA: Diagnosis not present

## 2017-06-15 MED ORDER — LOSARTAN POTASSIUM 50 MG PO TABS: 50.0000 mg | ORAL_TABLET | Freq: Every day | ORAL | 3 refills | Status: AC

## 2017-06-15 MED ORDER — METOLAZONE 2.5 MG PO TABS: ORAL_TABLET | ORAL | 3 refills | Status: AC

## 2017-06-15 NOTE — Progress Notes (Signed)
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 for additional afterload reduction. He is on 75 mg 3 times daily hydralazine, will likely increase that to 100 at follow-up visit. Was only able to tolerate 15 mg of Imdur. Continue carvedilol 25 mg daily.       Relevant Medications   torsemide (DEMADEX) 100 MG tablet   metolazone (ZAROXOLYN) 2.5 MG tablet   losartan (COZAAR) 50 MG tablet   Other Relevant Orders   EKG 12-Lead (Completed)   OSA (obstructive sleep apnea) -now on BiPAP (Chronic)    He had been following with pulmonary medicine here at [facility], now following at [facility].  I do think that having his BiPAP working well will help help his symptoms because he would sleep better.  Will also likely help with diuresis.  May need to question some component of obesity hypoventilation syndrome.      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 I am not exactly sure what how this is being followed, but one consideration to help with some diuresis and weight loss would be to potentially consider Jardiance.  We will need to review to see what has been done about his  diabetes in the past.      Relevant Medications   losartan (COZAAR) 50 MG tablet      This is been a very difficult, complex note to right.  I spent quite a bit of time with the patient in the room at least 40 minutes.  I then spent well over an hour to an hour and a half on chart review updating his medical history, procedures etc.  I reviewed at least 8 echocardiograms, 2 cardiac catheterizations, 7 or 8 hospitalizations and multiple clinic notes.  Current medicines are reviewed at length with the patient today. (+/- concerns) not sure what to take. The following changes have been made:Adding metolazone  Patient Instructions  Medication instructions  Sliding scale torsemide: Weigh yourself when you get home, then Daily in the Morning. Your dry weight will be what your scale says on the day you return home.(here is 322 lbs.).   If you gain more than 3 pounds from dry weight: Increase the METOLAZONE dosing to 2.5 MG  + TORSEMIDE 100 mg in the morning if weight is not down the next day increase METOLAZONE TO 5 MG + 100 MG TORSEMIDE until weight returns to baseline dry weight.  If weight gain is greater than 5 pounds in 2 days: Increased to 5 MG  METOLAZONE +TORSEMIDE  100 MG a day and contact the office for further assistance if weight does not go down the next day.  If the weight goes down more than 3 pounds from dry weight: Hold TORSEMIDE until it returns to baseline dry weight   ----INCREASE LOSARTAN TO 50 MG DAILY.  Your physician recommends that you schedule a follow-up appointment in 3 MONTHS WITH DR HARDING.    If you need a refill on your cardiac medications before your next appointment, please call your pharmacy.    Studies Ordered:   Orders Placed This Encounter  Procedures  . EKG 12-Lead      Bryan Lemma, M.D., M.S. Interventional Cardiologist   Pager # [phone] Phone # [phone] [address] [place], [place]  [address]   Thank you for choosing [facility]!!

## 2017-06-15 NOTE — Patient Instructions (Addendum)
Medication instructions  Sliding scale torsemide: Weigh yourself when you get home, then Daily in the Morning. Your dry weight will be what your scale says on the day you return home.(here is 322 lbs.).   If you gain more than 3 pounds from dry weight: Increase the METOLAZONE dosing to 2.5 MG  + TORSEMIDE 100 mg in the morning if weight is not down the next day increase METOLAZONE TO 5 MG + 100 MG TORSEMIDE until weight returns to baseline dry weight.  If weight gain is greater than 5 pounds in 2 days: Increased to 5 MG  METOLAZONE +TORSEMIDE  100 MG a day and contact the office for further assistance if weight does not go down the next day.  If the weight goes down more than 3 pounds from dry weight: Hold TORSEMIDE until it returns to baseline dry weight   ----INCREASE LOSARTAN TO 50 MG DAILY.      Your physician recommends that you schedule a follow-up appointment in 3 MONTHS WITH DR HARDING.    If you need a refill on your cardiac medications before your next appointment, please call your pharmacy.

## 2017-06-20 ENCOUNTER — Encounter: Payer: Self-pay | Admitting: Cardiology

## 2017-06-20 NOTE — Assessment & Plan Note (Signed)
He had been following with pulmonary medicine here at [facility], now following at [facility].  I do think that having his BiPAP working well will help help his symptoms because he would sleep better.  Will also likely help with diuresis.  May need to question some component of obesity hypoventilation syndrome.

## 2017-06-20 NOTE — Assessment & Plan Note (Addendum)
He has had this problem for quite a while.  His current creatinine is 2.0 in its not that much different than it was in 2014.  As I mentioned, we may simply have to allow for worsening creatinine provided his BUN level is stable in order to drive down his volume. I have a hard time believing that he was not aggressively diuresed during his hospitalization since he did have renal dysfunction.  It may be that we just need to continue to push through renal dysfunction.  I honestly think that he is better served being followed in an advanced heart failure clinic either at [facility] or here at [facility] , because he will likely need close interval follow-up.

## 2017-06-20 NOTE — Assessment & Plan Note (Signed)
He is progressed despite having improved EF, to class III symptoms.  I have reviewed 4 years of data to see how his weight is climbed from 240 pounds up to 325 pounds it seems like over the last year his weight is gone up dramatically. This is difficult to understand, because he is insistent upon his almost poor p.o. intake.  It would be hard to say that he is noncompliant with caloric intake, yet I do not know well we will see her again weight.  He does not appear to be that volume overloaded on exam, but clearly needs some diuresis. We have increased his metolazone dosing along with torsemide, and will increase losartan for more afterload reduction. We may simply have to understand that he will have some cardiorenal syndrome and his renal function may not be pristine, as long as he is making urine, we may have to accept worsening renal function.  He would not like to go back into the hospital, however, based on his current trend I would expect him to probably have a hospitalization in the next month or 2.  Hopefully the most recent episode where he had atrial flutter causing exacerbation will not recur. He is on a pretty good regimen, and has been followed by multiple term cardiologist.  I do not know that I can offer too much different information besides may be a different ear to listen.  I cannot find anything that was done inappropriately or inaccurately based on all of the hospitalizations and clinic visits that I have reviewed.

## 2017-06-20 NOTE — Assessment & Plan Note (Signed)
He has had labile pressures.  I wonder if some of this may very well have to do with noncompliance.  I do think he is probably better served to be on ARB and I will increase it to 50 mg daily.  He needs adequate afterload reduction.  Next step would increase hydralazine to 100 mg 3 times daily.

## 2017-06-20 NOTE — Assessment & Plan Note (Signed)
Interestingly, I do not see that he is on anything for diabetes. I am not exactly sure what how this is being followed, but one consideration to help with some diuresis and weight loss would be to potentially consider Jardiance.  We will need to review to see what has been done about his diabetes in the past.

## 2017-06-20 NOTE — Assessment & Plan Note (Signed)
This is perhaps 1 of the major concerns.  He swears up and down that he is eating appropriately, and taking all his medicines.  Things just do not really seem to add up however, does not make any sense that he is gained this much weight without eating, because it clearly would not be that much fluid weight.  If his symptoms not improving, I think we may need to bite the bullet and admit him for right and left heart catheterization confirming no coronary disease (he has a significant family history) and to basically reestablish his volume level.  Theoretically, if this was all weight from fluid, he would have high filling pressures.

## 2017-06-20 NOTE — Assessment & Plan Note (Signed)
He is comfortable cervical now.  When I last saw him in 2014 he was 240 pounds, walking 1/2 mile a day and doing quite well.  He is now 85 pounds higher, not able to walk around the room.  He is switched from CPAP to BiPAP.  He is developed worsening renal dysfunction. Plan: His weight is up, he is dyspneic and probably needs more aggressive diuresis.  We discussed sliding scale Lasix using a dry weight at home with 322 for now.  If metolazone 2.5 mg is not effective to increase diuretic output, would then increase to 5 mg. Continue current dose of torsemide and spironolactone. Increase Cozaar to 50 mg for additional afterload reduction. He is on 75 mg 3 times daily hydralazine, will likely increase that to 100 at follow-up visit. Was only able to tolerate 15 mg of Imdur. Continue carvedilol 25 mg daily.

## 2017-07-19 ENCOUNTER — Telehealth: Payer: Self-pay | Admitting: Cardiology

## 2017-07-19 NOTE — Telephone Encounter (Signed)
Received incoming records from [facility] for upcoming appointment on 09/12/17 @ 2pm with Dr. Herbie Baltimore. Records given to [facility] in Medical Records. 07/19/17 ab

## 2017-09-12 ENCOUNTER — Ambulatory Visit: Payer: Medicare Other | Admitting: Cardiology

## 2023-11-10 ENCOUNTER — Encounter: Payer: Self-pay | Admitting: Advanced Practice Midwife
